# Patient Record
Sex: Female | Born: 1935
Health system: Southern US, Community
[De-identification: ages and names within clinical notes are randomized; demographics above are authoritative.]

## PROBLEM LIST (undated history)

## (undated) DIAGNOSIS — E669 Obesity, unspecified: Secondary | ICD-10-CM

## (undated) DIAGNOSIS — E785 Hyperlipidemia, unspecified: Secondary | ICD-10-CM

## (undated) DIAGNOSIS — E079 Disorder of thyroid, unspecified: Secondary | ICD-10-CM

## (undated) DIAGNOSIS — G459 Transient cerebral ischemic attack, unspecified: Secondary | ICD-10-CM

## (undated) DIAGNOSIS — R7303 Prediabetes: Secondary | ICD-10-CM

## (undated) DIAGNOSIS — I10 Essential (primary) hypertension: Secondary | ICD-10-CM

## (undated) HISTORY — DX: Hyperlipidemia, unspecified: E78.5

## (undated) HISTORY — DX: Prediabetes: R73.03

## (undated) HISTORY — DX: Obesity, unspecified: E66.9

## (undated) HISTORY — DX: Transient cerebral ischemic attack, unspecified: G45.9

## (undated) HISTORY — PX: CATARACT EXTRACTION: SUR2

## (undated) HISTORY — PX: FRACTURE SURGERY: SHX138

## (undated) HISTORY — DX: Essential (primary) hypertension: I10

## (undated) HISTORY — PX: EYE SURGERY: SHX253

## (undated) HISTORY — DX: Disorder of thyroid, unspecified: E07.9

---

## 1984-10-21 HISTORY — PX: ABDOMINAL HYSTERECTOMY: SHX81

## 2001-03-03 ENCOUNTER — Ambulatory Visit (HOSPITAL_COMMUNITY): Admission: RE | Admit: 2001-03-03 | Discharge: 2001-03-03 | Payer: Self-pay | Admitting: Ophthalmology

## 2002-05-30 ENCOUNTER — Encounter: Payer: Self-pay | Admitting: Emergency Medicine

## 2002-05-30 ENCOUNTER — Emergency Department (HOSPITAL_COMMUNITY): Admission: EM | Admit: 2002-05-30 | Discharge: 2002-05-30 | Payer: Self-pay | Admitting: Emergency Medicine

## 2002-09-14 ENCOUNTER — Ambulatory Visit (HOSPITAL_COMMUNITY): Admission: RE | Admit: 2002-09-14 | Discharge: 2002-09-14 | Payer: Self-pay | Admitting: Family Medicine

## 2002-09-14 ENCOUNTER — Encounter: Payer: Self-pay | Admitting: Family Medicine

## 2003-09-22 ENCOUNTER — Ambulatory Visit (HOSPITAL_COMMUNITY): Admission: RE | Admit: 2003-09-22 | Discharge: 2003-09-22 | Payer: Self-pay | Admitting: Family Medicine

## 2003-10-13 ENCOUNTER — Other Ambulatory Visit: Admission: RE | Admit: 2003-10-13 | Discharge: 2003-10-13 | Payer: Self-pay | Admitting: General Surgery

## 2004-09-17 ENCOUNTER — Ambulatory Visit: Payer: Self-pay | Admitting: Family Medicine

## 2004-09-24 ENCOUNTER — Ambulatory Visit (HOSPITAL_COMMUNITY): Admission: RE | Admit: 2004-09-24 | Discharge: 2004-09-24 | Payer: Self-pay | Admitting: Family Medicine

## 2004-09-26 ENCOUNTER — Ambulatory Visit (HOSPITAL_COMMUNITY): Admission: RE | Admit: 2004-09-26 | Discharge: 2004-09-26 | Payer: Self-pay | Admitting: Family Medicine

## 2005-03-15 ENCOUNTER — Ambulatory Visit: Payer: Self-pay | Admitting: Family Medicine

## 2005-07-24 ENCOUNTER — Ambulatory Visit: Payer: Self-pay | Admitting: Family Medicine

## 2005-08-08 ENCOUNTER — Ambulatory Visit (HOSPITAL_COMMUNITY): Admission: RE | Admit: 2005-08-08 | Discharge: 2005-08-08 | Payer: Self-pay | Admitting: General Surgery

## 2005-08-08 ENCOUNTER — Encounter (INDEPENDENT_AMBULATORY_CARE_PROVIDER_SITE_OTHER): Payer: Self-pay | Admitting: General Surgery

## 2005-11-28 ENCOUNTER — Ambulatory Visit: Payer: Self-pay | Admitting: Family Medicine

## 2005-12-23 ENCOUNTER — Ambulatory Visit (HOSPITAL_COMMUNITY): Admission: RE | Admit: 2005-12-23 | Discharge: 2005-12-23 | Payer: Self-pay | Admitting: Family Medicine

## 2006-01-13 ENCOUNTER — Ambulatory Visit: Payer: Self-pay | Admitting: Family Medicine

## 2006-01-15 ENCOUNTER — Ambulatory Visit (HOSPITAL_COMMUNITY): Admission: RE | Admit: 2006-01-15 | Discharge: 2006-01-15 | Payer: Self-pay | Admitting: Family Medicine

## 2006-01-28 ENCOUNTER — Ambulatory Visit: Payer: Self-pay | Admitting: Family Medicine

## 2006-02-25 ENCOUNTER — Ambulatory Visit: Payer: Self-pay | Admitting: Family Medicine

## 2006-04-15 ENCOUNTER — Ambulatory Visit: Payer: Self-pay | Admitting: Family Medicine

## 2006-06-17 ENCOUNTER — Ambulatory Visit: Payer: Self-pay | Admitting: Family Medicine

## 2006-06-26 ENCOUNTER — Ambulatory Visit (HOSPITAL_COMMUNITY): Admission: RE | Admit: 2006-06-26 | Discharge: 2006-06-26 | Payer: Self-pay | Admitting: Family Medicine

## 2006-07-22 ENCOUNTER — Encounter: Payer: Self-pay | Admitting: Family Medicine

## 2006-07-22 ENCOUNTER — Other Ambulatory Visit: Admission: RE | Admit: 2006-07-22 | Discharge: 2006-07-22 | Payer: Self-pay | Admitting: Family Medicine

## 2006-08-26 ENCOUNTER — Ambulatory Visit: Payer: Self-pay | Admitting: Family Medicine

## 2007-01-21 ENCOUNTER — Ambulatory Visit: Payer: Self-pay | Admitting: Family Medicine

## 2007-01-23 ENCOUNTER — Ambulatory Visit (HOSPITAL_COMMUNITY): Admission: RE | Admit: 2007-01-23 | Discharge: 2007-01-23 | Payer: Self-pay | Admitting: Family Medicine

## 2007-06-18 ENCOUNTER — Ambulatory Visit: Payer: Self-pay | Admitting: Family Medicine

## 2007-08-05 ENCOUNTER — Ambulatory Visit: Payer: Self-pay | Admitting: Family Medicine

## 2007-09-02 ENCOUNTER — Ambulatory Visit: Payer: Self-pay | Admitting: Family Medicine

## 2007-09-02 LAB — CONVERTED CEMR LAB
Basophils Relative: 1 % (ref 0–1)
Calcium: 9.4 mg/dL (ref 8.4–10.5)
HCT: 45.4 % (ref 36.0–46.0)
Lymphocytes Relative: 33 % (ref 12–46)
Monocytes Absolute: 0.5 10*3/uL (ref 0.1–1.0)
Monocytes Relative: 8 % (ref 3–12)
Neutrophils Relative %: 52 % (ref 43–77)
Potassium: 4.2 meq/L (ref 3.5–5.3)
RDW: 15.8 % — ABNORMAL HIGH (ref 11.5–15.5)
Sodium: 142 meq/L (ref 135–145)
TSH: 4.39 microintl units/mL (ref 0.350–5.50)
WBC: 6.6 10*3/uL (ref 4.0–10.5)

## 2007-09-28 ENCOUNTER — Ambulatory Visit (HOSPITAL_COMMUNITY): Admission: RE | Admit: 2007-09-28 | Discharge: 2007-09-28 | Payer: Self-pay | Admitting: Family Medicine

## 2008-01-19 ENCOUNTER — Ambulatory Visit: Payer: Self-pay | Admitting: Family Medicine

## 2008-01-27 ENCOUNTER — Encounter: Payer: Self-pay | Admitting: Family Medicine

## 2008-01-27 DIAGNOSIS — M479 Spondylosis, unspecified: Secondary | ICD-10-CM | POA: Insufficient documentation

## 2008-01-27 DIAGNOSIS — I10 Essential (primary) hypertension: Secondary | ICD-10-CM | POA: Insufficient documentation

## 2008-01-27 DIAGNOSIS — H409 Unspecified glaucoma: Secondary | ICD-10-CM | POA: Insufficient documentation

## 2008-01-29 ENCOUNTER — Ambulatory Visit (HOSPITAL_COMMUNITY): Admission: RE | Admit: 2008-01-29 | Discharge: 2008-01-29 | Payer: Self-pay | Admitting: Family Medicine

## 2008-02-01 ENCOUNTER — Ambulatory Visit: Payer: Self-pay | Admitting: Family Medicine

## 2008-03-21 ENCOUNTER — Emergency Department (HOSPITAL_COMMUNITY): Admission: EM | Admit: 2008-03-21 | Discharge: 2008-03-21 | Payer: Self-pay | Admitting: Emergency Medicine

## 2008-03-23 ENCOUNTER — Inpatient Hospital Stay (HOSPITAL_COMMUNITY): Admission: RE | Admit: 2008-03-23 | Discharge: 2008-03-25 | Payer: Self-pay | Admitting: Orthopedic Surgery

## 2008-05-09 ENCOUNTER — Encounter: Admission: RE | Admit: 2008-05-09 | Discharge: 2008-05-09 | Payer: Self-pay | Admitting: Orthopedic Surgery

## 2008-09-29 ENCOUNTER — Ambulatory Visit: Payer: Self-pay | Admitting: Family Medicine

## 2008-09-29 ENCOUNTER — Telehealth: Payer: Self-pay | Admitting: Family Medicine

## 2008-10-01 DIAGNOSIS — E669 Obesity, unspecified: Secondary | ICD-10-CM | POA: Insufficient documentation

## 2008-11-15 ENCOUNTER — Encounter: Payer: Self-pay | Admitting: Family Medicine

## 2008-12-06 ENCOUNTER — Encounter: Payer: Self-pay | Admitting: Family Medicine

## 2008-12-07 ENCOUNTER — Encounter: Payer: Self-pay | Admitting: Family Medicine

## 2008-12-08 LAB — CONVERTED CEMR LAB
Basophils Absolute: 0 10*3/uL (ref 0.0–0.1)
Calcium: 9.5 mg/dL (ref 8.4–10.5)
Chloride: 105 meq/L (ref 96–112)
Cholesterol: 182 mg/dL (ref 0–200)
Eosinophils Absolute: 0.6 10*3/uL (ref 0.0–0.7)
Eosinophils Relative: 10 % — ABNORMAL HIGH (ref 0–5)
Glucose, Bld: 83 mg/dL (ref 70–99)
HCT: 44.1 % (ref 36.0–46.0)
HDL: 51 mg/dL (ref >39)
Hemoglobin: 15.5 g/dL — ABNORMAL HIGH (ref 12.0–15.0)
Lymphocytes Relative: 38 % (ref 12–46)
MCV: 84.3 fL (ref 78.0–100.0)
Monocytes Absolute: 0.6 10*3/uL (ref 0.1–1.0)
Potassium: 4.1 meq/L (ref 3.5–5.3)
RBC: 5.23 M/uL — ABNORMAL HIGH (ref 3.87–5.11)
RDW: 15.4 % (ref 11.5–15.5)
TSH: 6.273 microintl units/mL — ABNORMAL HIGH (ref 0.350–4.50)
WBC: 5.9 10*3/uL (ref 4.0–10.5)

## 2008-12-12 ENCOUNTER — Ambulatory Visit: Payer: Self-pay | Admitting: Family Medicine

## 2008-12-12 ENCOUNTER — Encounter: Payer: Self-pay | Admitting: Family Medicine

## 2008-12-12 ENCOUNTER — Other Ambulatory Visit: Admission: RE | Admit: 2008-12-12 | Discharge: 2008-12-12 | Payer: Self-pay | Admitting: Family Medicine

## 2008-12-12 DIAGNOSIS — E039 Hypothyroidism, unspecified: Secondary | ICD-10-CM | POA: Insufficient documentation

## 2008-12-15 ENCOUNTER — Encounter: Payer: Self-pay | Admitting: Family Medicine

## 2008-12-15 LAB — CONVERTED CEMR LAB
Basophils Relative: 0 % (ref 0–1)
Lymphs Abs: 2.5 10*3/uL (ref 0.7–4.0)
MCHC: 31.9 g/dL (ref 30.0–36.0)
Monocytes Absolute: 0.5 10*3/uL (ref 0.1–1.0)
Neutro Abs: 2.5 10*3/uL (ref 1.7–7.7)
Platelets: 339 10*3/uL (ref 150–400)
RBC: 5.45 M/uL — ABNORMAL HIGH (ref 3.87–5.11)

## 2008-12-22 ENCOUNTER — Encounter: Payer: Self-pay | Admitting: Family Medicine

## 2009-01-04 ENCOUNTER — Encounter: Payer: Self-pay | Admitting: Family Medicine

## 2009-01-30 ENCOUNTER — Ambulatory Visit (HOSPITAL_COMMUNITY): Admission: RE | Admit: 2009-01-30 | Discharge: 2009-01-30 | Payer: Self-pay | Admitting: Family Medicine

## 2009-03-13 ENCOUNTER — Ambulatory Visit: Payer: Self-pay | Admitting: Family Medicine

## 2009-03-13 DIAGNOSIS — M25579 Pain in unspecified ankle and joints of unspecified foot: Secondary | ICD-10-CM | POA: Insufficient documentation

## 2009-04-03 ENCOUNTER — Encounter: Payer: Self-pay | Admitting: Family Medicine

## 2009-04-04 ENCOUNTER — Encounter: Payer: Self-pay | Admitting: Family Medicine

## 2009-07-14 IMAGING — CR DG ANKLE COMPLETE 3+V*R*
3 series · 3 of 3 positions shown · non-contrast
Comparison: None

CLINICAL DATA: Ankle pain post fall

RIGHT ANKLE - COMPLETE 3+ VIEW

[view not recorded (1 of 3)]
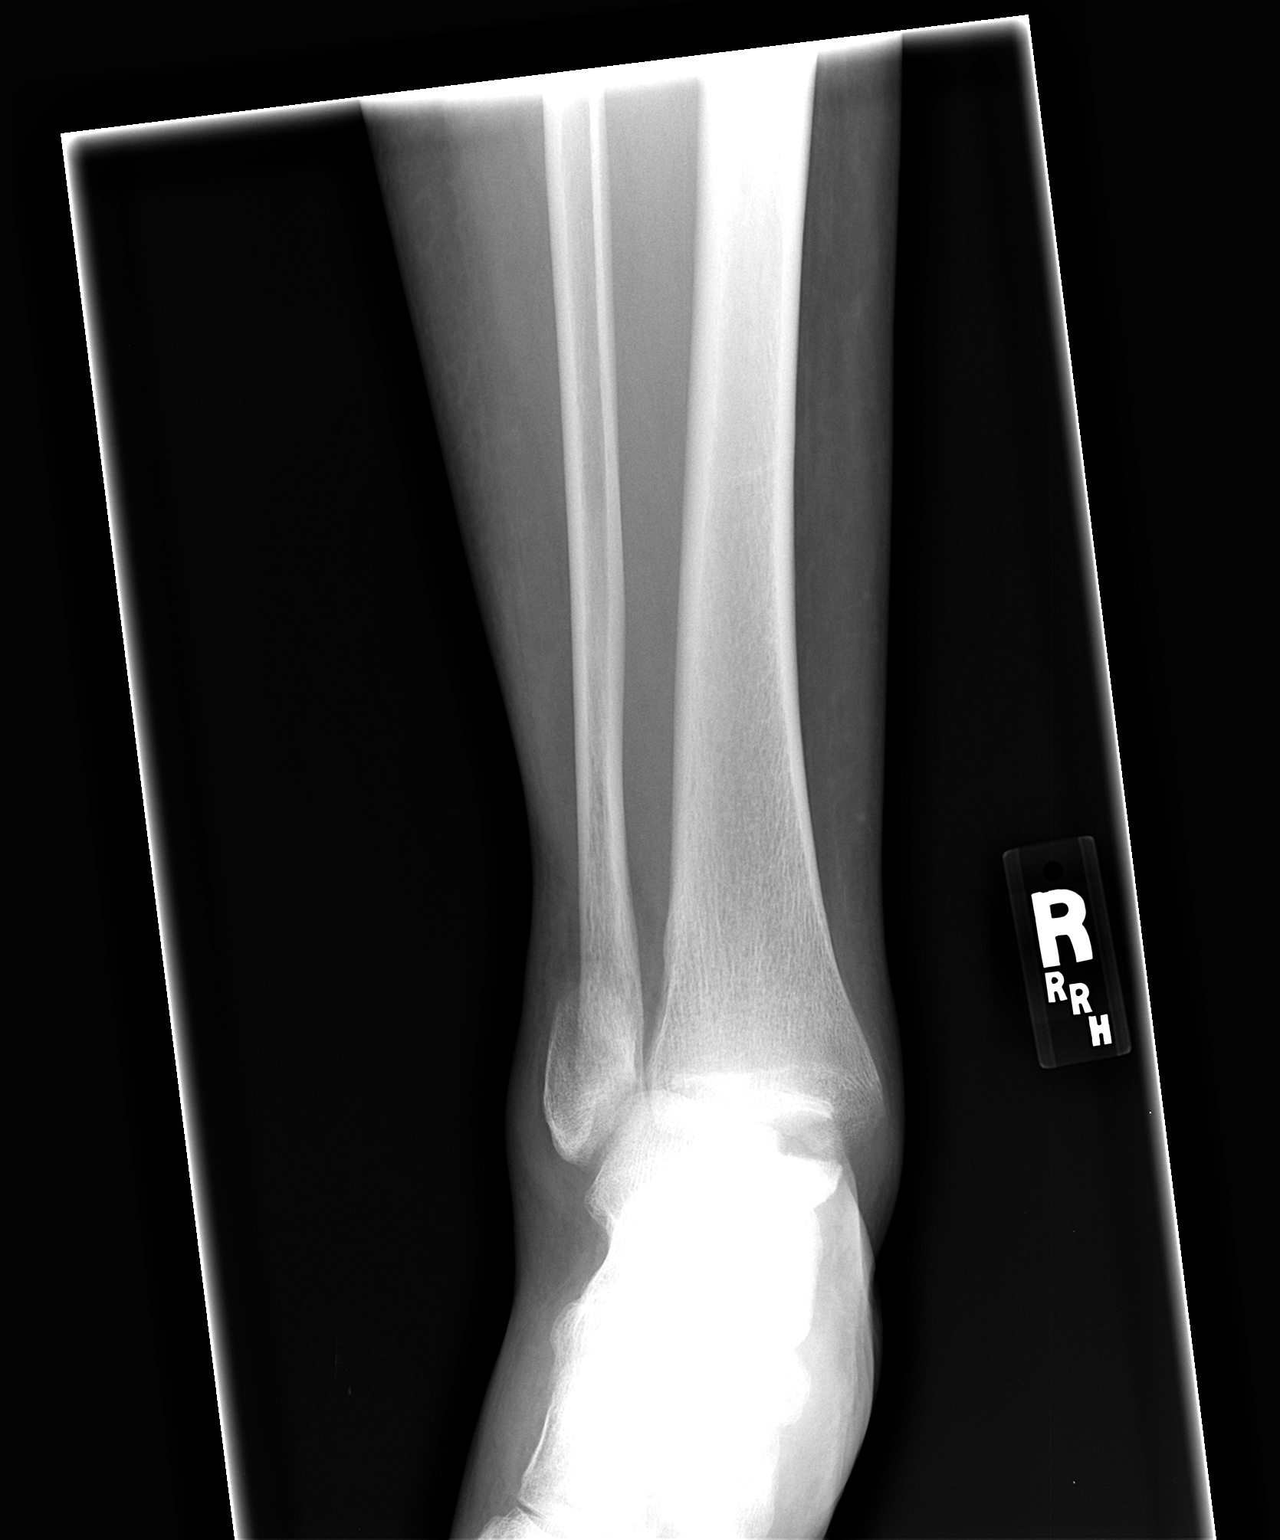

[view not recorded (2 of 3)]
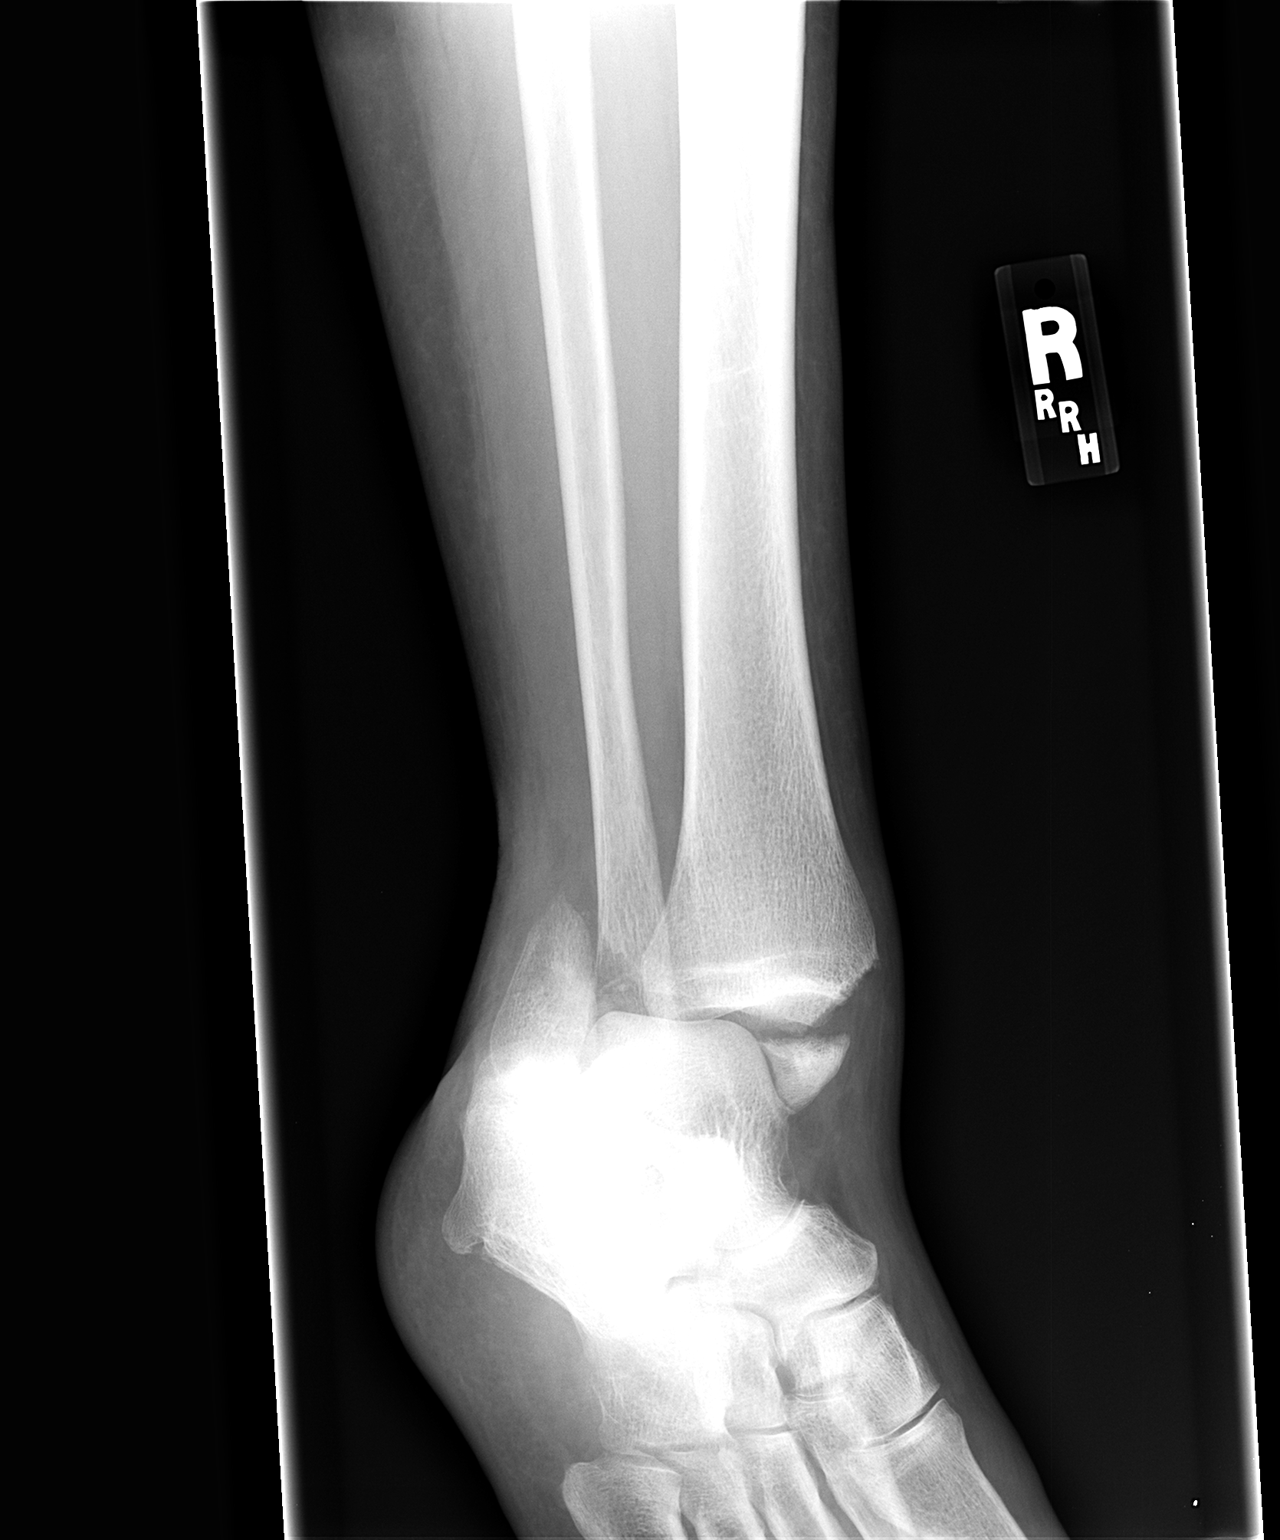

[view not recorded (3 of 3)]
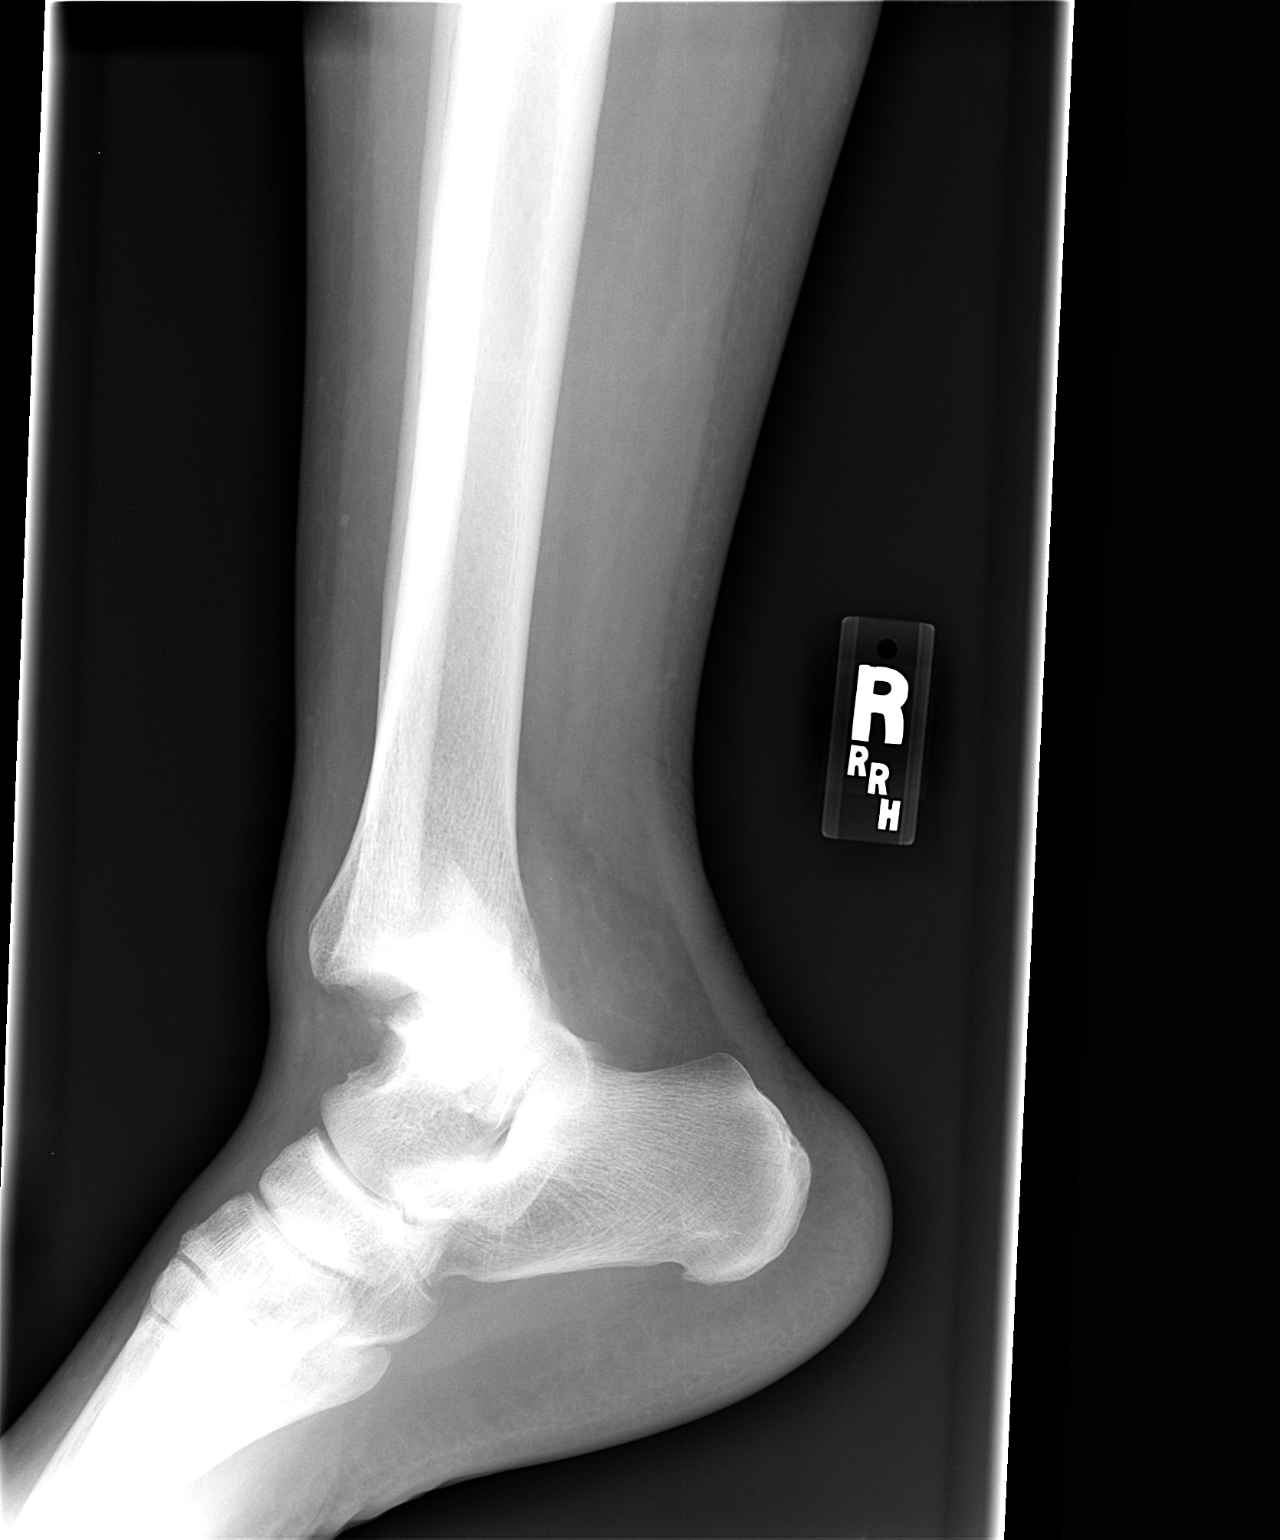

[3 of 3 positions shown; findings below may reference images not displayed]

FINDINGS: There is displaced fracture of distal right fibula and
distal right tibia medial and lateral malleolus.  There is
posterior ankle dislocation with probable fracture of the posterior
tibial malleolus.
IMPRESSION: Right ankle dislocation with tri malleolar fracture.

## 2009-07-14 IMAGING — CR DG ANKLE 2V *R*
2 series · 2 of 2 positions shown · non-contrast
Comparison: 03/21/2008

CLINICAL DATA: Postreduction.  The patient fell.  Ankle pain.

RIGHT ANKLE - 2 VIEW

[view not recorded (1 of 2)]
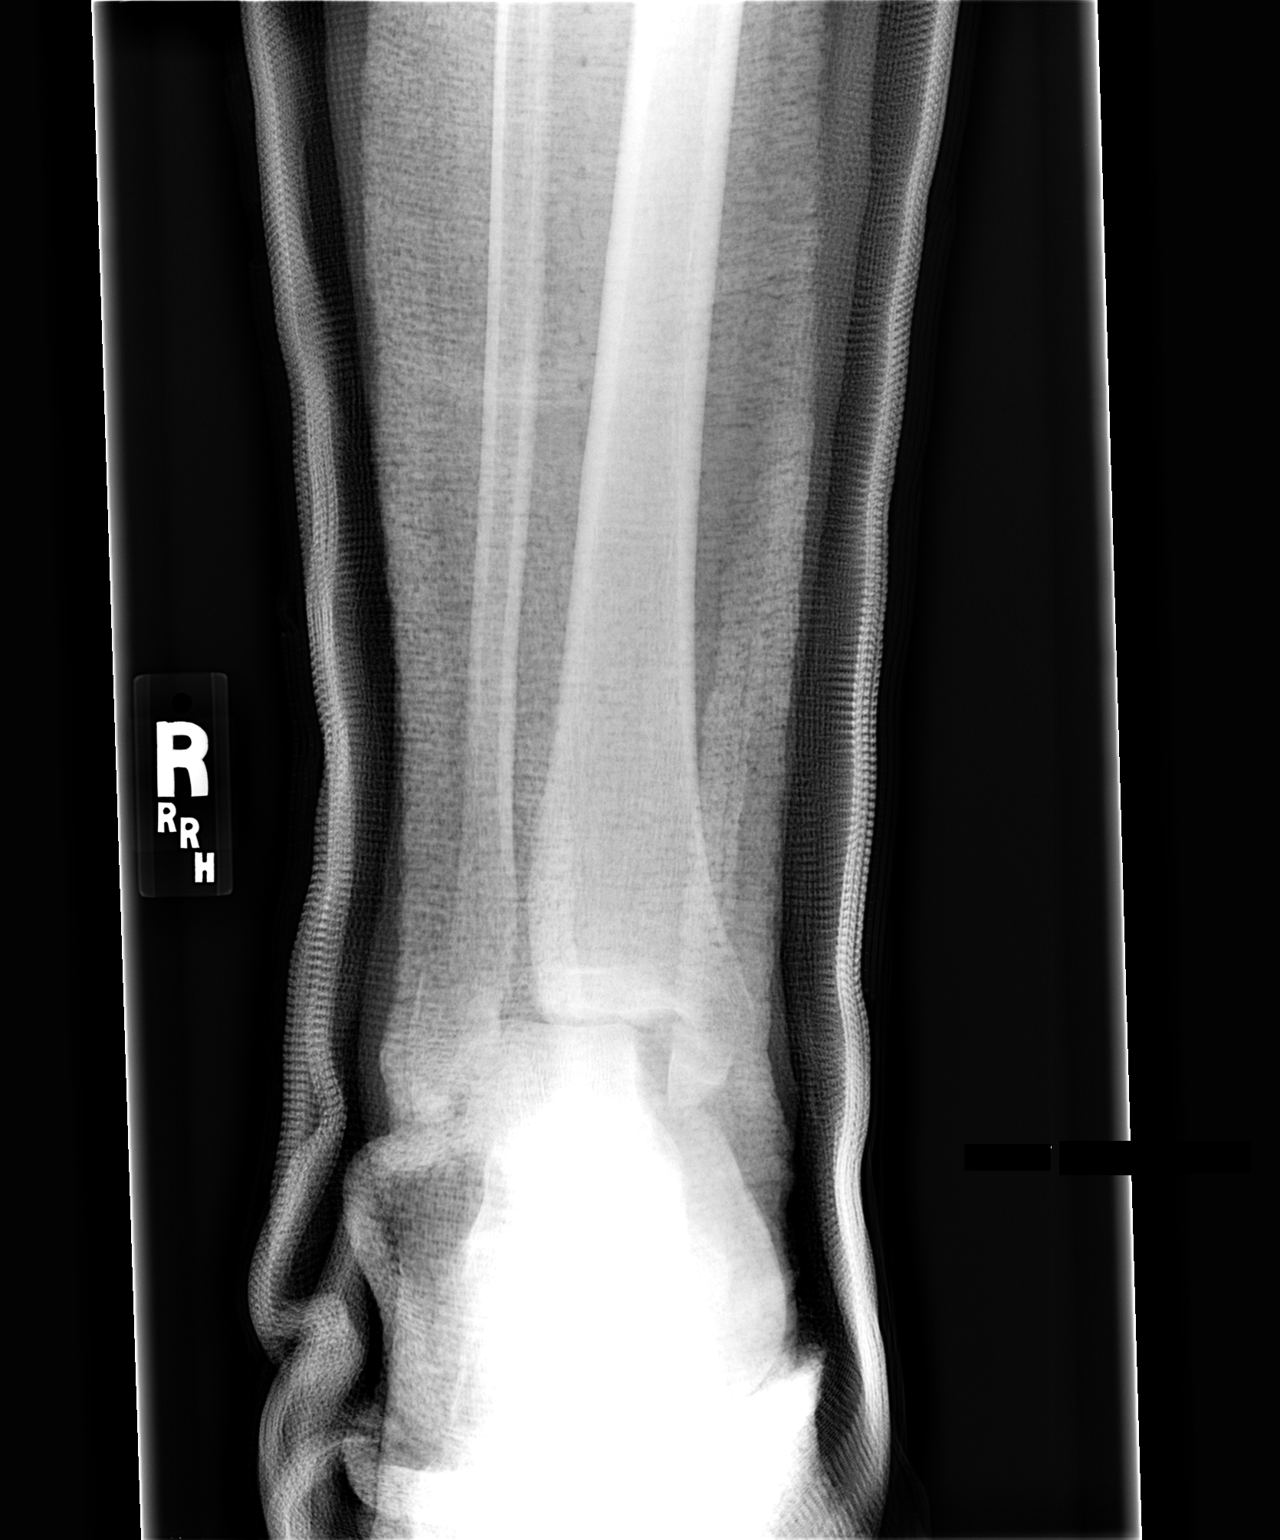

[view not recorded (2 of 2)]
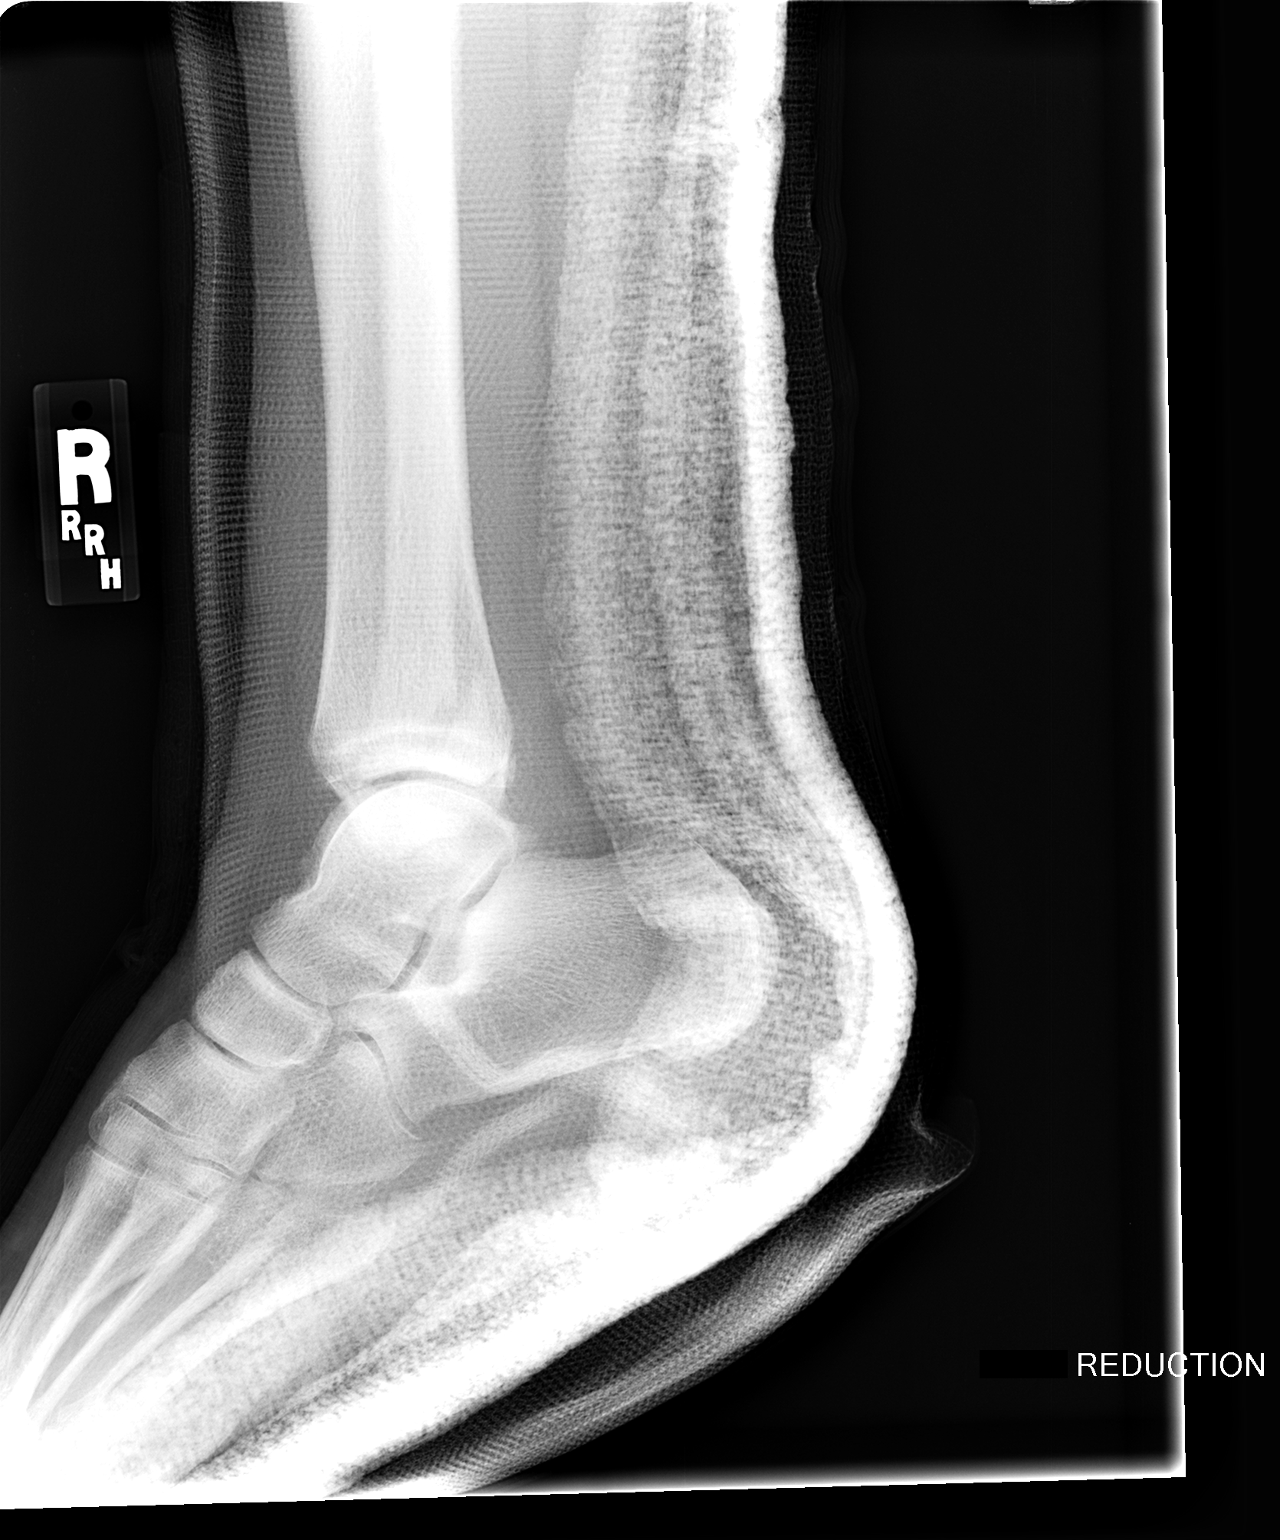

[2 of 2 positions shown; findings below may reference images not displayed]

FINDINGS: Postreduction films demonstrate a plaster splinting
overlying the right ankle.  Again noted is trimalleolar fracture.
The mortise is widened medially.  No new fractures are identified.
Detail is limited because of plaster splinting material..
IMPRESSION: Status post reduction of trimalleolar fracture.  Widened mortise
medially.

## 2009-08-17 ENCOUNTER — Emergency Department (HOSPITAL_COMMUNITY): Admission: EM | Admit: 2009-08-17 | Discharge: 2009-08-17 | Payer: Self-pay | Admitting: Emergency Medicine

## 2009-08-17 ENCOUNTER — Telehealth: Payer: Self-pay | Admitting: Family Medicine

## 2009-08-17 ENCOUNTER — Encounter: Payer: Self-pay | Admitting: Family Medicine

## 2009-08-31 ENCOUNTER — Ambulatory Visit: Payer: Self-pay | Admitting: Family Medicine

## 2009-08-31 DIAGNOSIS — E785 Hyperlipidemia, unspecified: Secondary | ICD-10-CM | POA: Insufficient documentation

## 2009-08-31 DIAGNOSIS — R5383 Other fatigue: Secondary | ICD-10-CM | POA: Insufficient documentation

## 2009-08-31 DIAGNOSIS — E782 Mixed hyperlipidemia: Secondary | ICD-10-CM | POA: Insufficient documentation

## 2009-08-31 DIAGNOSIS — G459 Transient cerebral ischemic attack, unspecified: Secondary | ICD-10-CM | POA: Insufficient documentation

## 2009-08-31 DIAGNOSIS — R079 Chest pain, unspecified: Secondary | ICD-10-CM | POA: Insufficient documentation

## 2009-08-31 DIAGNOSIS — R5381 Other malaise: Secondary | ICD-10-CM

## 2009-09-01 ENCOUNTER — Encounter: Payer: Self-pay | Admitting: Family Medicine

## 2009-09-01 LAB — CONVERTED CEMR LAB
HDL: 53 mg/dL (ref >39)
LDL Cholesterol: 135 mg/dL — ABNORMAL HIGH (ref 0–99)

## 2009-09-04 ENCOUNTER — Encounter: Payer: Self-pay | Admitting: Family Medicine

## 2009-09-04 LAB — CONVERTED CEMR LAB

## 2009-09-05 ENCOUNTER — Encounter: Payer: Self-pay | Admitting: Family Medicine

## 2009-09-05 LAB — CONVERTED CEMR LAB
AST: 24 units/L (ref 0–37)
Albumin: 4.1 g/dL (ref 3.5–5.2)
Indirect Bilirubin: 0.4 mg/dL (ref 0.0–0.9)
Total Bilirubin: 0.5 mg/dL (ref 0.3–1.2)

## 2009-09-07 ENCOUNTER — Encounter: Payer: Self-pay | Admitting: Family Medicine

## 2009-09-07 ENCOUNTER — Inpatient Hospital Stay (HOSPITAL_BASED_OUTPATIENT_CLINIC_OR_DEPARTMENT_OTHER): Admission: RE | Admit: 2009-09-07 | Discharge: 2009-09-07 | Payer: Self-pay | Admitting: Cardiology

## 2009-09-12 ENCOUNTER — Encounter: Payer: Self-pay | Admitting: Family Medicine

## 2009-09-18 LAB — CONVERTED CEMR LAB: HCV Ab: NEGATIVE

## 2009-09-21 ENCOUNTER — Encounter: Payer: Self-pay | Admitting: Family Medicine

## 2009-09-22 ENCOUNTER — Ambulatory Visit (HOSPITAL_COMMUNITY): Admission: RE | Admit: 2009-09-22 | Discharge: 2009-09-22 | Payer: Self-pay | Admitting: Neurology

## 2009-10-12 ENCOUNTER — Ambulatory Visit: Payer: Self-pay | Admitting: Family Medicine

## 2009-11-08 ENCOUNTER — Encounter: Payer: Self-pay | Admitting: Family Medicine

## 2009-12-14 ENCOUNTER — Encounter: Payer: Self-pay | Admitting: Family Medicine

## 2009-12-21 ENCOUNTER — Telehealth (INDEPENDENT_AMBULATORY_CARE_PROVIDER_SITE_OTHER): Payer: Self-pay | Admitting: *Deleted

## 2010-01-09 ENCOUNTER — Ambulatory Visit: Payer: Self-pay | Admitting: Family Medicine

## 2010-05-31 ENCOUNTER — Ambulatory Visit: Payer: Self-pay | Admitting: Family Medicine

## 2010-06-07 ENCOUNTER — Ambulatory Visit (HOSPITAL_COMMUNITY): Admission: RE | Admit: 2010-06-07 | Discharge: 2010-06-07 | Payer: Self-pay | Admitting: Family Medicine

## 2010-06-08 ENCOUNTER — Ambulatory Visit: Payer: Self-pay | Admitting: Family Medicine

## 2010-06-26 ENCOUNTER — Ambulatory Visit: Payer: Self-pay | Admitting: Family Medicine

## 2010-07-13 ENCOUNTER — Ambulatory Visit: Payer: Self-pay | Admitting: Family Medicine

## 2010-07-17 ENCOUNTER — Telehealth: Payer: Self-pay | Admitting: Family Medicine

## 2010-07-17 LAB — CONVERTED CEMR LAB
Albumin: 3.8 g/dL (ref 3.5–5.2)
Alkaline Phosphatase: 78 units/L (ref 39–117)
BUN: 11 mg/dL (ref 6–23)
Bilirubin, Direct: 0.1 mg/dL (ref 0.0–0.3)
Calcium: 9.6 mg/dL (ref 8.4–10.5)
Eosinophils Absolute: 0.5 10*3/uL (ref 0.0–0.7)
Lymphocytes Relative: 36 % (ref 12–46)
Lymphs Abs: 2.2 10*3/uL (ref 0.7–4.0)
Neutro Abs: 3 10*3/uL (ref 1.7–7.7)
Neutrophils Relative %: 48 % (ref 43–77)
Platelets: 287 10*3/uL (ref 150–400)
RDW: 15.4 % (ref 11.5–15.5)
TSH: 4.48 microintl units/mL (ref 0.350–4.500)
Total Bilirubin: 0.6 mg/dL (ref 0.3–1.2)
Total Protein: 6.5 g/dL (ref 6.0–8.3)
Triglycerides: 63 mg/dL (ref <150)
VLDL: 13 mg/dL (ref 0–40)
WBC: 6.2 10*3/uL (ref 4.0–10.5)

## 2010-08-24 ENCOUNTER — Ambulatory Visit: Payer: Self-pay | Admitting: Family Medicine

## 2010-08-24 DIAGNOSIS — L723 Sebaceous cyst: Secondary | ICD-10-CM | POA: Insufficient documentation

## 2010-08-27 ENCOUNTER — Telehealth: Payer: Self-pay | Admitting: Family Medicine

## 2010-11-02 ENCOUNTER — Ambulatory Visit
Admission: RE | Admit: 2010-11-02 | Discharge: 2010-11-02 | Payer: Self-pay | Source: Home / Self Care | Attending: Family Medicine | Admitting: Family Medicine

## 2010-11-20 NOTE — Assessment & Plan Note (Signed)
Summary: flu shot  Nurse Visit   Allergies: No Known Drug Allergies  Immunizations Administered:  Influenza Vaccine # 1:    Vaccine Type: Fluvax MCR    Site: left deltoid    Mfr: novartis    Dose: 0.5 ml    Route: IM    Given by: Adella Hare LPN    Exp. Date: 05/12    Lot #: [id number]    VIS given: 05/15/10 version given June 26, 2010.  Orders Added: 1)  Influenza Vaccine MCR [00025]

## 2010-11-20 NOTE — Cardiovascular Report (Signed)
Summary: Cardiac Cath Other  Cardiac Cath Other   Imported By: Lind Guest 06/22/2010 08:01:29  _____________________________________________________________________  External Attachment:    Type:   Image     Comment:   External Document

## 2010-11-20 NOTE — Progress Notes (Signed)
Summary: referral  Phone Note Call from Patient   Summary of Call: called pt to follow up on her MRI and she stated she had one done. That Dr. Gerilyn Pilgrim ordered it for her. And had it done at [facility]. Just wanted to make sure this was taking care of. And it was. Initial call taken by: Rudene Anda,  December 21, 2009 9:20 AM

## 2010-11-20 NOTE — Assessment & Plan Note (Signed)
Summary: boil on shoulder   Vital Signs:  Patient profile:   77 year old female Menstrual status:  hysterectomy Height:      68 inches Weight:      224 pounds BMI:     34.18 O2 Sat:      98 % on Room air Pulse rate:   67 / minute Resp:     16 per minute BP sitting:   164 / 80  (left arm)  Vitals Entered By: Adella Hare LPN (August 24, 2010 11:18 AM)  O2 Flow:  Room air CC: follow-up visit Is Patient Diabetic? Yes Did you bring your meter with you today? No Pain Assessment Patient in pain? no        CC:  follow-up visit.  History of Present Illness: 1 week h/o paingful swelling right shoulder, initially started as a a painful hair bump, which she squeezed and which rapidly emlarged, became painful and also drained pus. Has not hd this in the past. She deneies documented fever , but has had some chills.She is distresed since her BP is still high, she is asymptomatic  however, no headache or lightheadedness.  Current Medications (verified): 1)  Aspirin 325 Mg Tabs (Aspirin) .... One Tab By Mouth Once Daily 2)  Metoprolol Succinate 25 Mg Xr24h-Tab (Metoprolol Succinate) .... Take 1 Tablet By Mouth Once A Day 3)  Amlodipine Besylate 10 Mg Tabs (Amlodipine Besylate) .... Take 1 Tablet By Mouth Once A Day  Allergies (verified): 1)  ! Ace Inhibitors  Review of Systems General:  Complains of chills, fatigue, and malaise. Eyes:  Denies discharge and red eye. ENT:  Denies hoarseness and nasal congestion. CV:  Denies chest pain or discomfort, palpitations, and swelling of feet. Resp:  Denies cough and sputum productive. GI:  Denies abdominal pain, constipation, diarrhea, nausea, and vomiting. GU:  Denies dysuria and urinary frequency. Derm:  Complains of lesion(s). Neuro:  Denies headaches. Psych:  Denies anxiety and depression. Endo:  Denies cold intolerance, excessive thirst, excessive urination, and heat intolerance. Heme:  Denies abnormal bruising and  bleeding. Allergy:  Denies hives or rash and itching eyes.  Physical Exam  General:  Well-developed,well-nourished,in no acute distress; alert,appropriate and cooperative throughout examination HEENT: No facial asymmetry,  EOMI, No sinus tenderness, TM's Clear, oropharynx  pink and moist.   Chest: Clear to auscultation bilaterally.  CVS: S1, S2, No murmurs, No S3.   Abd: Soft, Nontender.  MS: Adequate ROM spine, hips, shoulders and knees.  Ext: No edema.   CNS: CN 2-12 intact, power tone and sensation normal throughout.   Skin: Infected sebaceous cyst on posterior right shoulder , bleeding sebaceous material extruded on pressure Psych: Good eye contact, normal affect.  Memory intact, not anxious or depressed appearing.    Impression & Recommendations:  Problem # 1:  SEBACEOUS CYST (ICD-706.2) Assessment Comment Only  Orders: Medicare Electronic Prescription [phone]) Surgical Referral (Surgery) Rocephin  250mg  (W2956) Admin of Therapeutic Inj  intramuscular or subcutaneous (21308) Surgical Referral (Surgery)  Problem # 2:  HYPERLIPIDEMIA (ICD-272.4) Assessment: Improved  The following medications were removed from the medication list:    Lovastatin 20 Mg Tabs (Lovastatin) .Marland Kitchen... Take 1 tab by mouth at bedtime Low fat dietdiscussed and encouraged  Labs Reviewed: Creat: 0.81 (07/13/2010)     Labs Reviewed: SGOT: 20 (07/13/2010)   SGPT: 17 (07/13/2010)   HDL:51 (07/13/2010), 53 (08/31/2009)  LDL:123 (07/13/2010), 135 (08/31/2009)  Chol:187 (07/13/2010), 201 (08/31/2009)  Trig:63 (07/13/2010), 63 (08/31/2009)  Problem #  3:  HYPERTENSION (ICD-401.9) Assessment: Deteriorated  Her updated medication list for this problem includes:    Metoprolol Succinate 25 Mg Xr24h-tab (Metoprolol succinate) .Marland Kitchen... Take 1 tablet by mouth once a day    Amlodipine Besylate 10 Mg Tabs (Amlodipine besylate) .Marland Kitchen... Take 1 tablet by mouth once a day Patient advised to follow low sodium diet rich in  fruit and vegetables, and to commit to at least 30 minutes 5 days per week of regular exercise , to improve blood presure control.   BP today: 164/80 Prior BP: 150/80 (07/13/2010)  Labs Reviewed: K+: 4.5 (07/13/2010) Creat: : 0.81 (07/13/2010)   Chol: 187 (07/13/2010)   HDL: 51 (07/13/2010)   LDL: 123 (07/13/2010)   TG: 63 (07/13/2010)  Complete Medication List: 1)  Aspirin 325 Mg Tabs (Aspirin) .... One tab by mouth once daily 2)  Metoprolol Succinate 25 Mg Xr24h-tab (Metoprolol succinate) .... Take 1 tablet by mouth once a day 3)  Amlodipine Besylate 10 Mg Tabs (Amlodipine besylate) .... Take 1 tablet by mouth once a day 4)  Doxepin Hcl 100 Mg Caps (Doxepin hcl) .... Take 1 capsule by mouth two times a day  Patient Instructions: 1)  Please schedule a follow-up appointment in 3 months. 2)  You are referred to Dr Lovell Sheehan for further management, you have an infected cyst . 3)  you will get Rocephin in the office, and meds are sent to your pharmacy Prescriptions: DOXEPIN HCL 100 MG CAPS (DOXEPIN HCL) Take 1 capsule by mouth two times a day  #20 x 0   Entered and Authorized by:   Syliva Overman MD   Signed by:   Syliva Overman MD on 08/24/2010   Method used:   Electronically to        [facility] [phone]* (retail)       [address]       [place], [place]  [address]       Ph: [phone] or [phone]       Fax: [phone]   RxID:   [id number]    Medication Administration  Injection # 1:    Medication: Rocephin  250mg     Diagnosis: SEBACEOUS CYST (ICD-706.2)    Route: IM    Site: R deltoid    Exp Date: 05/14    Lot #: [id number]    Mfr: [email or web address]    Comments: rocephin 500mg     Patient tolerated injection without complications    Given by: Adella Hare LPN (August 24, 2010 2:00 PM)  Orders Added: 1)  Est. Patient Level IV [99214] 2)  Medicare Electronic Prescription [G8553] 3)  Surgical Referral [Surgery] 4)  Rocephin  250mg  [J0696] 5)  Admin  of Therapeutic Inj  intramuscular or subcutaneous [96372] 6)  Surgical Referral [Surgery]     Medication Administration  Injection # 1:    Medication: Rocephin  250mg     Diagnosis: SEBACEOUS CYST (ICD-706.2)    Route: IM    Site: R deltoid    Exp Date: 05/14    Lot #: [id number]    Mfr: [email or web address]    Comments: rocephin 500mg     Patient tolerated injection without complications    Given by: Adella Hare LPN (August 24, 2010 2:00 PM)  Orders Added: 1)  Est. Patient Level IV [95638] 2)  Medicare Electronic Prescription [G8553] 3)  Surgical Referral [Surgery] 4)  Rocephin  250mg  [J0696] 5)  Admin of Therapeutic Inj  intramuscular or subcutaneous [  96372] 6)  Surgical Referral [Surgery]   Appended Document: boil on shoulder pls give Jaala Keil for 2nd week in January re blood pressure, thanks I had given a 3 month that's too long  Appended Document: boil on shoulder appt. 1.13.11 @ 8:00

## 2010-11-20 NOTE — Assessment & Plan Note (Signed)
Summary: F UP   Vital Signs:  Patient profile:   77 year old female Menstrual status:  hysterectomy Height:      68 inches Weight:      224 pounds BMI:     34.18 O2 Sat:      96 % Pulse rate:   71 / minute Pulse rhythm:   regular Resp:     16 per minute BP sitting:   148 / 80  (left arm) Cuff size:   large  Vitals Entered By: Everitt Amber LPN (May 31, 2010 8:05 AM)  Nutrition Counseling: Patient's BMI is greater than 25 and therefore counseled on weight management options. CC: Follow up chronic problems   CC:  Follow up chronic problems.  History of Present Illness: Reports  that she has been  doing well. She has had several visits withcardiology neg cath and new beta blocker added, blood pressure noted to be above140 since med changes were made Denies recent fever or chills. Denies sinus pressure, nasal congestion , ear pain or sore throat. Denies chest congestion, or cough productive of sputum. Denies chest pain, palpitations, PND, orthopnea or leg swelling. Denies abdominal pain, nausea, vomitting, diarrhea or constipation. Denies change in bowel movements or bloody stool. Denies dysuria , frequency, incontinence or hesitancy. Denies  joint pain, swelling, or reduced mobility. she walks for 1 hour daily, and is frustrated that her weight is unchanged. Denies headaches, vertigo, seizures. Denies depression, anxiety or insomnia. Denies  rash, lesions, or itch.     Current Medications (verified): 1)  Amlodipine Besylate 5 Mg  Tabs (Amlodipine Besylate) .... One Tab By Mouth Once Daily 2)  Oscal 500/200 D-3 500-200 Mg-Unit  Tabs (Calcium-Vitamin D) .... One Tab Po Three Times A Day 3)  Lovastatin 20 Mg Tabs (Lovastatin) .... Take 1 Tab By Mouth At Bedtime 4)  Aspirin 325 Mg Tabs (Aspirin) 5)  Metoprolol Succinate 25 Mg Xr24h-Tab (Metoprolol Succinate) .... Take 1 Tablet By Mouth Once A Day  Allergies (verified): No Known Drug Allergies  Review of Systems  See HPI General:  Complains of sleep disorder; sometimes difficulty fallinmg asleep when she has alot on her miind. Eyes:  Denies blurring and discharge. Endo:  Denies cold intolerance, excessive hunger, excessive thirst, excessive urination, heat intolerance, polyuria, and weight change. Heme:  Denies abnormal bruising and bleeding. Allergy:  Denies hives or rash and itching eyes.  Physical Exam  General:  Well-developed,obese,in no acute distress; alert,appropriate and cooperative throughout examination HEENT: No facial asymmetry,  EOMI, No sinus tenderness, TM's Clear, oropharynx  pink and moist.   Chest: Clear to auscultation bilaterally.  CVS: S1, S2, No murmurs, No S3.   Abd: Soft, Nontender.  MS: Adequate ROM spine, hips, shoulders and knees.  Ext: No edema.   CNS: CN 2-12 intact, power tone and sensation normal throughout.   Skin: Intact, no visible lesions or rashes.  Psych: Good eye contact, normal affect.  Memory intact, not anxious or depressed appearing.    Impression & Recommendations:  Problem # 1:  HYPERLIPIDEMIA (ICD-272.4) Assessment Comment Only  Her updated medication list for this problem includes:    Lovastatin 20 Mg Tabs (Lovastatin) .Marland Kitchen... Take 1 tab by mouth at bedtime, need to obtain most recent lab data and oV from cardiology, low fat diet encouraged  Labs Reviewed: SGOT: 24 (09/01/2009)   SGPT: 38 (09/01/2009)   HDL:53 (08/31/2009), 51 (12/06/2008)  LDL:135 (08/31/2009), 118 (12/06/2008)  Chol:201 (08/31/2009), 182 (12/06/2008)  Trig:63 (08/31/2009), 63 (12/06/2008)  Problem # 2:  OBESITY, UNSPECIFIED (ICD-278.00) Assessment: Unchanged  Ht: 68 (05/31/2010)   Wt: 224 (05/31/2010)   BMI: 34.18 (05/31/2010)  Problem # 3:  HYPERTENSION (ICD-401.9) Assessment: Deteriorated  The following medications were removed from the medication list:    Amlodipine Besylate 5 Mg Tabs (Amlodipine besylate) ..... One tab by mouth once daily Her updated medication  list for this problem includes:    Metoprolol Succinate 25 Mg Xr24h-tab (Metoprolol succinate) .Marland Kitchen... Take 1 tablet by mouth once a day    Amlodipine Besy-benazepril Hcl 5-10 Mg Caps (Amlodipine besy-benazepril hcl) .Marland Kitchen... Take 1 capsule by mouth once a day  BP today: 148/80 Prior BP: 130/80 (01/09/2010)  Labs Reviewed: K+: 4.1 (12/06/2008) Creat: : 0.80 (12/06/2008)   Chol: 201 (08/31/2009)   HDL: 53 (08/31/2009)   LDL: 135 (08/31/2009)   TG: 63 (08/31/2009)  Problem # 4:  GLAUCOMA (ICD-365.9) Assessment: Comment Only pt encouraged to keep appts with opthalmology regularly  Complete Medication List: 1)  Oscal 500/200 D-3 500-200 Mg-unit Tabs (Calcium-vitamin d) .... One tab po three times a day 2)  Lovastatin 20 Mg Tabs (Lovastatin) .... Take 1 tab by mouth at bedtime 3)  Aspirin 325 Mg Tabs (Aspirin) 4)  Metoprolol Succinate 25 Mg Xr24h-tab (Metoprolol succinate) .... Take 1 tablet by mouth once a day 5)  Amlodipine Besy-benazepril Hcl 5-10 Mg Caps (Amlodipine besy-benazepril hcl) .... Take 1 capsule by mouth once a day  Other Orders: Radiology Referral (Radiology)  Patient Instructions: 1)  F/U in 6 to 8 weeks 2)  Ear flush in next week, with audiometry 3)  It is important that you exercise regularly at least 20 minutes 5 times a week. If you develop chest pain, have severe difficulty breathing, or feel very tired , stop exercising immediately and seek medical attention. 4)  You need to lose weight. Consider a lower calorie diet and regular exercise. Goal is 3 pounds 5)  New med for HTN , stop the amlodipine pls your BP is high. 6)    [place] to be scheduled Prescriptions: AMLODIPINE BESY-BENAZEPRIL HCL 5-10 MG CAPS (AMLODIPINE BESY-BENAZEPRIL HCL) Take 1 capsule by mouth once a day  #30 x 2   Entered by:   Everitt Amber LPN   Authorized by:   Syliva Overman MD   Signed by:   Everitt Amber LPN on 16/07/9603   Method used:   Print[facility] then [facility] ...       [facility]  [address] [phone]*  (retail)       [address]       [place], [place]  [address]       Ph: [phone] or [phone]       Fax: [phone]   RxID:   [id number] AMLODIPINE BESY-BENAZEPRIL HCL 5-10 MG CAPS (AMLODIPINE BESY-BENAZEPRIL HCL) Take 1 capsule by mouth once a day  #30 x 2   Entered and Authorized by:   Syliva Overman MD   Signed by:   Syliva Overman MD on 05/31/2010   Method used:   Electronically to        [facility]. [phone]* (retail)       [address]       [place], [place]  [address]       Ph: [phone] or [phone]       Fax: [phone]   RxID:   [id number]

## 2010-11-20 NOTE — Assessment & Plan Note (Signed)
Summary: ear  Nurse Visit  Comments PATIENT CAME TODAY FOR AN EAR FLUSH, EARS WERE CLEAR SO THIS WAS NOT PERFORMED A HEARING TEST WAS ADMINISTERED PER REQUEST OF DR SIMPSON 40db HL: Left  500 hz: 40db 1000 hz: No Response 2000 hz: 40db 4000 hz: 40db Right  500 hz: 40db 1000 hz: No Response 2000 hz: 40db 4000 hz: 40db    Allergies: No Known Drug Allergies pt had come in for ear irrigation, after use of softener , no wax visible,she does have hearing loss on testing /screening, but wishe to wait on referral for further management  bP check [per pt request was 140/80

## 2010-11-20 NOTE — Assessment & Plan Note (Signed)
Summary: f up   Vital Signs:  Patient profile:   77 year old female Menstrual status:  hysterectomy Height:      68 inches Weight:      222 pounds BMI:     33.88 O2 Sat:      95 % Pulse rate:   67 / minute Pulse rhythm:   regular Resp:     16 per minute BP sitting:   150 / 80  (left arm) Cuff size:   large  Vitals Entered By: Everitt Amber LPN (July 13, 2010 8:54 AM)  Nutrition Counseling: Patient's BMI is greater than 25 and therefore counseled on weight management options.  CC: Follow up chronic problems   CC:  Follow up chronic problems.  History of Present Illness: Reports  that  she has been doing well, she is just extremely bothered that she is unable to have a nl BP Denies recent fever or chills. Denies sinus pressure, nasal congestion , ear pain or sore throat. Denies chest congestion, or cough productive of sputum. Denies chest pain, palpitations, PND, orthopnea or leg swelling. Denies abdominal pain, nausea, vomitting, diarrhea or constipation. Denies change in bowel movements or bloody stool. Denies dysuria , frequency, incontinence or hesitancy. Denies  joint pain, swelling, or reduced mobility. Denies headaches, vertigo, seizures. Denies depression, anxiety or insomnia. Denies  rash, lesions, or itch.     Current Medications (verified): 1)  Oscal 500/200 D-3 500-200 Mg-Unit  Tabs (Calcium-Vitamin D) .... One Tab Po Three Times A Day 2)  Lovastatin 20 Mg Tabs (Lovastatin) .... Take 1 Tab By Mouth At Bedtime 3)  Aspirin 325 Mg Tabs (Aspirin) 4)  Metoprolol Succinate 25 Mg Xr24h-Tab (Metoprolol Succinate) .... Take 1 Tablet By Mouth Once A Day 5)  Amlodipine Besy-Benazepril Hcl 5-10 Mg Caps (Amlodipine Besy-Benazepril Hcl) .... Take 1 Capsule By Mouth Once A Day  Allergies (verified): 1)  ! Ace Inhibitors  Review of Systems      See HPI General:  Complains of fatigue. Eyes:  Denies discharge, eye pain, and red eye. ENT:  Denies hoarseness,  nosebleeds, sinus pressure, and sore throat. CV:  Denies chest pain or discomfort, palpitations, and swelling of feet. Resp:  Denies cough and sputum productive. GI:  Denies abdominal pain, bloody stools, constipation, diarrhea, nausea, and vomiting. GU:  Denies dysuria and urinary frequency. Derm:  Denies itching and rash. Psych:  Denies suicidal thoughts/plans, thoughts of violence, and unusual visions or sounds. Heme:  Denies abnormal bruising and bleeding. Allergy:  Complains of seasonal allergies.  Physical Exam  General:  Well-developed,obese,in no acute distress; alert,appropriate and cooperative throughout examination HEENT: No facial asymmetry,  EOMI, No sinus tenderness, TM's Clear, oropharynx  pink and moist.   Chest: Clear to auscultation bilaterally.  CVS: S1, S2, No murmurs, No S3.   Abd: Soft, Nontender.  MS: Adequate ROM spine, hips, shoulders and knees.  Ext: No edema.   CNS: CN 2-12 intact, power tone and sensation normal throughout.   Skin: Intact, no visible lesions or rashes.  Psych: Good eye contact, flat  affect.  Memory intact, not anxious or  depressed appearing.    Impression & Recommendations:  Problem # 1:  FATIGUE (ICD-780.79) Assessment Deteriorated  Orders: T-CBC w/Diff ([id number]) T-TSH ([id number])  Problem # 2:  HYPERTENSION (ICD-401.9) Assessment: Unchanged  The following medications were removed from the medication list:    Amlodipine Besy-benazepril Hcl 5-10 Mg Caps (Amlodipine besy-benazepril hcl) .Marland Kitchen... Take 1 capsule by mouth once a day Her  updated medication list for this problem includes:    Metoprolol Succinate 25 Mg Xr24h-tab (Metoprolol succinate) .Marland Kitchen... Take 1 tablet by mouth once a day    Amlodipine Besylate 10 Mg Tabs (Amlodipine besylate) .Marland Kitchen... Take 1 tablet by mouth once a day  Orders: T-Basic Metabolic Panel [phone])  BP today: 150/80 Prior BP: 148/80 (05/31/2010)  Labs Reviewed: K+: 4.1 (12/06/2008) Creat: :  0.80 (12/06/2008)   Chol: 201 (08/31/2009)   HDL: 53 (08/31/2009)   LDL: 135 (08/31/2009)   TG: 63 (08/31/2009)  Problem # 3:  OBESITY, UNSPECIFIED (ICD-278.00) Assessment: Deteriorated  Ht: 68 (07/13/2010)   Wt: 222 (07/13/2010)   BMI: 33.88 (07/13/2010) therapeutic lifestyle change discussed and encouraged  Problem # 4:  DIABETES MELLITUS, TYPE II (ICD-250.00) Assessment: Deteriorated  Complete Medication List: 1)  Oscal 500/200 D-3 500-200 Mg-unit Tabs (Calcium-vitamin d) .... One tab po three times a day 2)  Lovastatin 20 Mg Tabs (Lovastatin) .... Take 1 tab by mouth at bedtime 3)  Aspirin 325 Mg Tabs (Aspirin) 4)  Metoprolol Succinate 25 Mg Xr24h-tab (Metoprolol succinate) .... Take 1 tablet by mouth once a day 5)  Amlodipine Besylate 10 Mg Tabs (Amlodipine besylate) .... Take 1 tablet by mouth once a day  Other Orders: T-Hepatic Function [phone]) T-Lipid Profile [phone])  Patient Instructions: 1)  F/U in  2nd week in November. 2)  It is important that you exercise regularly at least 20 minutes 5 times a week. If you develop chest pain, have severe difficulty breathing, or feel very tired , stop exercising immediately and seek medical attention. 3)  You need to lose weight. Consider a lower calorie diet and regular exercise.  4)  BMP prior to visit, ICD-9: 5)  Hepatic Panel prior to visit, ICD-9: 6)  Lipid Panel prior to visit, ICD-9: 7)  TSH prior to visit, ICD-9:  fasting today 8)  CBC w/ Diff prior to visit, ICD-9: 9)  PLs stop amlodipine/benaz, new for your BP is amlodpine  10mg   Prescriptions: AMLODIPINE BESYLATE 10 MG TABS (AMLODIPINE BESYLATE) Take 1 tablet by mouth once a day  #30 x 3   Entered and Authorized by:   Syliva Overman MD   Signed by:   Syliva Overman MD on 07/13/2010   Method used:   Printed then faxed to ...       [facility]  [address] [phone]* (retail)       [address]       [place], [place]  [address]       Ph: [phone]  or [phone]       Fax: [phone]   RxID:   [id number]

## 2010-11-20 NOTE — Progress Notes (Signed)
[facility]  [facility] Note Call from Patient   Reason for Call: Insurance Question Summary of Call: [facility] wants you to call her back at [phone] about her medicine Initial call taken by: Lind Guest,  July 17, 2010 12:44 PM  Follow-up for Phone Call        advised to d/c the lotrel and fill amlodipine 10 mg  Follow-up by: Everitt Amber LPN,  July 17, 2010 12:58 PM

## 2010-11-20 NOTE — Progress Notes (Signed)
  Phone Note Outgoing Call   Call placed by: dr simpson Summary of Call: attempted to call pt doxepin sent in instead of doxycycline msg left for her to call me back this evening or to call the office in the morning, will attempt to call her bck tonight also, correct med will be sent in also Initial call taken by: Syliva Overman MD,  August 27, 2010 8:09 PM  Follow-up for Phone Call        spoke with the pharmacist the pt had not yet collected the doxepin, asked that she too try sand contact Ms Rubye Oaks for her med Follow-up by: Syliva Overman MD,  August 27, 2010 8:14 PM    New/Updated Medications: DOXYCYCLINE HYCLATE 100 MG CAPS (DOXYCYCLINE HYCLATE) Take 1 capsule by mouth two times a day Prescriptions: DOXYCYCLINE HYCLATE 100 MG CAPS (DOXYCYCLINE HYCLATE) Take 1 capsule by mouth two times a day  #20 x 0   Entered and Authorized by:   Syliva Overman MD   Signed by:   Syliva Overman MD on 08/27/2010   Method used:   [facility]ronically to        [facility] [phone]* (retail)       [address]       [place], [place]  [address]       Ph: [phone] or [phone]       Fax: [phone]   RxID:   [id number]

## 2010-11-20 NOTE — Assessment & Plan Note (Signed)
Summary: follow up - room 3   Vital Signs:  Patient profile:   77 year old female Menstrual status:  hysterectomy Height:      68 inches Weight:      224 pounds BMI:     34.18 O2 Sat:      99 % on Room air Pulse rate:   55 / minute Resp:     16 per minute BP sitting:   130 / 80  (left arm)  Vitals Entered By: Adella Hare LPN (January 09, 2010 9:05 AM) CC: follow up Is Patient Diabetic? No Pain Assessment Patient in pain? no        CC:  follow up.  History of Present Illness: Pt is here today for check up. No complaints.  Is aware that her wt has gone up & doesnt' know what to do to change this.  Did give up fried foods for lent. Previously walked for exercise. But hasnt been exercising recently.  Hx of htn & hyperlipidemia.  Is taking meds.  No  probs with.  No chest pain, palp or difficulty breathing. Pts med list was updated.  She is no longer on diuretic & wonders if she still needs to take K+ daily.    Current Medications (verified): 1)  Amlodipine Besylate 5 Mg  Tabs (Amlodipine Besylate) .... One Tab By Mouth Once Daily 2)  Hydrochlorothiazide 25 Mg  Tabs (Hydrochlorothiazide) .... One Tab By Mouth Once Daily 3)  Oscal 500/200 D-3 500-200 Mg-Unit  Tabs (Calcium-Vitamin D) .... One Tab Po Three Times A Day 4)  Klor-Con 20 Meq Pack (Potassium Chloride) .... One Tab By Mouth Qd 5)  Lovastatin 20 Mg Tabs (Lovastatin) .... Take 1 Tab By Mouth At Bedtime  Allergies (verified): No Known Drug Allergies  Past History:  Past medical, surgical, family and social histories (including risk factors) reviewed, and no changes noted (except as noted below).  Past Medical History: Current Problems:  DEGENERATIVE JOINT DISEASE, SPINE (ICD-721.90) GLAUCOMA (ICD-365.9) HYPERTENSION (ICD-401.9) Fracture R ankle 03/21/08 Hyperlipidemia  Past Surgical History: Reviewed history from 09/29/2008 and no changes required. TAH and BSO (1990) Laser eye surgery both eyes  (2007) ORIF R ankle 03/23/08  (Dr.Dean)  Family History: Reviewed history from 01/27/2008 and no changes required. mom deceased  dad deceased mi sisters 2 living HTN,DM Brothers 1 HTN  Social History: Reviewed history from 01/27/2008 and no changes required. 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 Resp:  Denies cough and shortness of breath. GI:  Denies change in bowel habits, indigestion, nausea, and vomiting.  Physical Exam  General:  Well-developed,well-nourished,in no acute distress; alert,appropriate and cooperative throughout examination Head:  Normocephalic and atraumatic without obvious abnormalities. No apparent alopecia or balding. Ears:  External ear exam shows no significant lesions or deformities.  Otoscopic examination reveals clear canals, tympanic membranes are intact bilaterally without bulging, retraction, inflammation or discharge. Hearing is grossly normal bilaterally. Nose:  External nasal examination shows no deformity or inflammation. Nasal mucosa are pink and moist without lesions or exudates. Mouth:  Oral mucosa and oropharynx without lesions or exudates.  Neck:  No deformities, masses, or tenderness noted. Lungs:  Normal respiratory effort, chest expands symmetrically. Lungs are clear to auscultation, no crackles or wheezes. Heart:  Normal rate and regular rhythm. S1 and S2 normal without gallop, murmur, click, rub or other extra sounds. Cervical Nodes:  No lymphadenopathy noted Psych:  Cognition and judgment appear intact. Alert and cooperative with normal attention span and concentration. No apparent delusions, illusions, hallucinations   Impression & Recommendations:  Problem # 1:  HYPERTENSION (ICD-401.9) Assessment Improved  The following medications were removed from the medication list:    Hydrochlorothiazide 25 Mg Tabs (Hydrochlorothiazide) .....  One tab by mouth once daily Her updated medication list for this problem includes:    Amlodipine Besylate 5 Mg Tabs (Amlodipine besylate) ..... One tab by mouth once daily  Orders: T-Electrolytes (09811-91478)  BP today: 130/80 Prior BP: 140/80 (10/12/2009)  Labs Reviewed: K+: 4.1 (12/06/2008) Creat: : 0.80 (12/06/2008)   Chol: 201 (08/31/2009)   HDL: 53 (08/31/2009)   LDL: 135 (08/31/2009)   TG: 63 (08/31/2009)  Problem # 2:  HYPERLIPIDEMIA (ICD-272.4) Assessment: Comment Only  Her updated medication list for this problem includes:    Lovastatin 20 Mg Tabs (Lovastatin) .Marland Kitchen... Take 1 tab by mouth at bedtime  Labs Reviewed: SGOT: 24 (09/01/2009)   SGPT: 38 (09/01/2009)   HDL:53 (08/31/2009), 51 (12/06/2008)  LDL:135 (08/31/2009), 118 (12/06/2008)  Chol:201 (08/31/2009), 182 (12/06/2008)  Trig:63 (08/31/2009), 63 (12/06/2008)  Problem # 3:  OBESITY, UNSPECIFIED (ICD-278.00) Assessment: Deteriorated  Ht: 68 (01/09/2010)   Wt: 224 (01/09/2010)   BMI: 34.18 (01/09/2010) Discussed healthy diet & diet info given.  Encouraged walking for exercise.  Complete Medication List: 1)  Amlodipine Besylate 5 Mg Tabs (Amlodipine besylate) .... One tab by mouth once daily 2)  Oscal 500/200 D-3 500-200 Mg-unit Tabs (Calcium-vitamin d) .... One tab po three times a day 3)  Klor-con 20 Meq Pack (Potassium chloride) .... One tab by mouth qd 4)  Lovastatin 20 Mg Tabs (Lovastatin) .... Take 1 tab by mouth at bedtime 5)  Aspirin 325 Mg Tabs (Aspirin)  Patient Instructions: 1)  Please schedule a follow-up appointment in 3 months. 2)  It is important that you exercise regularly at least 20 minutes 5 times a week. If you develop chest pain, have severe difficulty breathing, or feel very tired , stop exercising immediately and seek medical attention. 3)  You need to lose weight. Consider a lower calorie diet and regular exercise.  4)  Discontinue your potassium since you are no longer on a water pill. 5)   Have blood work done in 4 weeks though to check your potassium level.

## 2010-11-22 NOTE — Assessment & Plan Note (Signed)
Summary: follow up 3 months/slj   Vital Signs:  Patient profile:   77 year old female Menstrual status:  hysterectomy Height:      68 inches Weight:      227 pounds BMI:     34.64 O2 Sat:      97 % Pulse rate:   82 / minute Pulse rhythm:   regular Resp:     16 per minute BP sitting:   130 / 72  (left arm) Cuff size:   large  Vitals Entered By: Everitt Amber LPN (November 02, 2010 8:04 AM)  Nutrition Counseling: Patient's BMI is greater than 25 and therefore counseled on weight management options. CC: Follow up chronic problems   CC:  Follow up chronic problems.  History of Present Illness: Reports  that tshe has been doing well, except for recent weight gain. The cyst on her shpulder was taken care of in the offfice with no incision needed. Denies recent fever or chills. Denies sinus pressure, nasal congestion , ear pain or sore throat. Denies chest congestion, or cough productive of sputum. Denies chest pain, palpitations, PND, orthopnea or leg swelling. Denies abdominal pain, nausea, vomitting, diarrhea or constipation. Denies change in bowel movements or bloody stool. Denies dysuria , frequency, incontinence or hesitancy. Denies  joint pain, swelling, or reduced mobility. Denies headaches, vertigo, seizures. Denies depression, anxiety or insomnia. Denies  rash, lesions, or itch.     Current Medications (verified): 1)  Aspirin 325 Mg Tabs (Aspirin) .... One Tab By Mouth Once Daily 2)  Metoprolol Succinate 25 Mg Xr24h-Tab (Metoprolol Succinate) .... Take 1 Tablet By Mouth Once A Day 3)  Amlodipine Besylate 10 Mg Tabs (Amlodipine Besylate) .... Take 1 Tablet By Mouth Once A Day 4)  Latanoprost 0.005 % Soln (Latanoprost) .... One Drop in Each Eye At Bedtime  Allergies (verified): 1)  ! Ace Inhibitors  Review of Systems      See HPI Eyes:  Denies discharge, eye pain, and red eye. Endo:  Denies cold intolerance, excessive hunger, excessive thirst, heat intolerance, and  polyuria. Heme:  Denies abnormal bruising and bleeding. Allergy:  Denies hives or rash and itching eyes.  Physical Exam  General:  Well-developed,well-nourished,in no acute distress; alert,appropriate and cooperative throughout examination HEENT: No facial asymmetry,  EOMI, No sinus tenderness, TM's Clear, oropharynx  pink and moist.   Chest: Clear to auscultation bilaterally.  CVS: S1, S2, No murmurs, No S3.   Abd: Soft, Nontender.  MS: Adequate ROM spine, hips, shoulders and knees.  Ext: No edema.   CNS: CN 2-12 intact, power tone and sensation normal throughout.   Skin: Intact, no visible lesions or rashes.  Psych: Good eye contact, normal affect.  Memory intact, not anxious or depressed appearing.    Impression & Recommendations:  Problem # 1:  HYPERLIPIDEMIA (ICD-272.4) Assessment Improved area totally healed, no residual collection  Problem # 2:  HYPERTENSION (ICD-401.9) Assessment: Improved  Her updated medication list for this problem includes:    Metoprolol Succinate 25 Mg Xr24h-tab (Metoprolol succinate) .Marland Kitchen... Take 1 tablet by mouth once a day    Amlodipine Besylate 10 Mg Tabs (Amlodipine besylate) .Marland Kitchen... Take 1 tablet by mouth once a day  BP today: 130/72 Prior BP: 164/80 (08/24/2010)  Labs Reviewed: K+: 4.5 (07/13/2010) Creat: : 0.81 (07/13/2010)   Chol: 187 (07/13/2010)   HDL: 51 (07/13/2010)   LDL: 123 (07/13/2010)   TG: 63 (07/13/2010)  Orders: Medicare Electronic Prescription [phone])  Problem # 3:  OBESITY, UNSPECIFIED (  ICD-278.00) Assessment: Deteriorated  Ht: 68 (11/02/2010)   Wt: 227 (11/02/2010)   BMI: 34.64 (11/02/2010) therapeutic lifestyle change discussed and encouraged  Complete Medication List: 1)  Aspirin 325 Mg Tabs (Aspirin) .... One tab by mouth once daily 2)  Metoprolol Succinate 25 Mg Xr24h-tab (Metoprolol succinate) .... Take 1 tablet by mouth once a day 3)  Amlodipine Besylate 10 Mg Tabs (Amlodipine besylate) .... Take 1 tablet by  mouth once a day 4)  Latanoprost 0.005 % Soln (Latanoprost) .... One drop in each eye at bedtime  Patient Instructions: 1)  CPE in 3.5 months 2)  It is important that you exercise regularly at least 30 minutes 6 times a week. If you develop chest pain, have severe difficulty breathing, or feel very tired , stop exercising immediately and seek medical attention. 3)  You need to lose weight. Consider a lower calorie diet and regular exercise.RETHINK FOOD!!  Goal is 5 to 7 pounds 4)  BP ios excellent 130/70, no med changes   Prescriptions: AMLODIPINE BESYLATE 10 MG TABS (AMLODIPINE BESYLATE) Take 1 tablet by mouth once a day  #30 x 3   Entered by:   Everitt Amber LPN   Authorized by:   Syliva Overman MD   Signed by:   Everitt Amber LPN on 78/46/9629   Method used:   Electronically to        [facility] [phone]* (retail)       [address]       [place], [place]  [address]       Ph: [phone]       Fax: [phone]   RxID:   [id number]    Orders Added: 1)  Est. Patient Level IV [41660] 2)  Medicare Electronic Prescription [phone]

## 2011-01-24 LAB — BASIC METABOLIC PANEL
CO2: 29 mEq/L (ref 19–32)
Calcium: 9 mg/dL (ref 8.4–10.5)
Creatinine, Ser: 0.89 mg/dL (ref 0.4–1.2)
GFR calc non Af Amer: >60 mL/min (ref >60)
Glucose, Bld: 95 mg/dL (ref 70–99)
Sodium: 140 mEq/L (ref 135–145)

## 2011-01-24 LAB — CBC
MCHC: 33.3 g/dL (ref 30.0–36.0)
Platelets: 276 10*3/uL (ref 150–400)
RDW: 15.8 % — ABNORMAL HIGH (ref 11.5–15.5)

## 2011-01-24 LAB — POCT CARDIAC MARKERS
CKMB, poc: 1.2 ng/mL (ref 1.0–8.0)
Myoglobin, poc: 119 ng/mL (ref 12–200)
Troponin i, poc: <0.05 ng/mL (ref 0.00–0.09)

## 2011-01-24 LAB — DIFFERENTIAL
Basophils Absolute: 0 10*3/uL (ref 0.0–0.1)
Basophils Relative: 1 % (ref 0–1)
Lymphocytes Relative: 32 % (ref 12–46)
Monocytes Absolute: 0.3 10*3/uL (ref 0.1–1.0)
Neutro Abs: 2.2 10*3/uL (ref 1.7–7.7)
Neutrophils Relative %: 52 % (ref 43–77)

## 2011-03-05 NOTE — Op Note (Signed)
NAME:  Tammy Galvan, Tammy Galvan                 ACCOUNT NO.:  [id number]   MEDICAL RECORD NO.:  [id number]          PATIENT TYPE:  INP   LOCATION:  5004                         FACILITY:  [facility]   PHYSICIAN:  Burnard Bunting, M.D.    DATE OF BIRTH:  11-Aug-1933   DATE OF PROCEDURE:  03/23/2008  DATE OF DISCHARGE:                               OPERATIVE REPORT   PREOPERATIVE DIAGNOSIS:  Right ankle bimalleolar fracture.   POSTOPERATIVE DIAGNOSIS:  Right ankle bimalleolar fracture.   PROCEDURE:  Right total ankle fracture open reduction and internal  fixation.   SURGEON:  Dr. Burnard Bunting, M.D.   ASSISTANT:  None.   ANESTHESIA:  General tracheal.   ESTIMATED BLOOD LOSS:  Minimal.   INDICATIONS:  [name] is a 77 year old female who fell and broke her  right ankle bowling.  She was reduced and sent for further management  and presents now for operative management of her unstable ankle fracture  after explanation of risks and benefits.   PROCEDURE IN DETAIL:  The patient brought to the operating room where  general endotracheal anesthesia was induced.  Time out was called.  Right leg was prescribed with alcohol and Betadine, was allowed to air  dry, and then prepped with DuraPrep solution and draped in sterile  manner.  Impervious dressing was used.  Preoperative IV antibiotics were  given.  Ankle Esmarch was utilized for approximately 45 minutes.  Lateral approach was made to the lateral malleolus.  Skin and  subcutaneous tissue sharply divided.  Care was taken to avoid injury to  the anterior branch of the superficial peroneal nerve.  Fracture was  identified.  The fracture site was irrigated and reduced.  A lag screw  was placed from anterior to posterior.  Lateral plate was then applied  with good reduction noted in the AP and lateral planes under  fluoroscopy.  At this time, the incision was thoroughly irrigated, and  attention was then directed towards the medial side.  A medial  incision  was made centered over the fracture.  Skin and subcutaneous tissues were  sharply divided.  Care was taken to avoid injury to the saphenous nerve  and vein.  Fracture was identified and irrigated.  No debris in the  joint.  Fracture was reduced and 2 malleolar screws were placed under  fluoroscopic guidance across the fracture with good reduction and  stability achieved.  At this time, the syndesmosis was tested and found  to be stable.  The ankle Esmarch was reduced.  Both incisions were  irrigated and closed using 2-0 Vicryl  suture and 3-0 nylon simple sutures.  Bulky posterior well-padded splint  was applied.  The patient tolerated the procedure well without immediate  complications.  All bleeding points encountered were controlled with  electrocautery prior to closures.      Burnard Bunting, M.D.  Electronically Signed     GSD/MEDQ  D:  03/23/2008  T:  03/24/2008  Job:  [id number]

## 2011-03-08 NOTE — Discharge Summary (Signed)
NAME:  Tammy Galvan, Tammy Galvan                 ACCOUNT NO.:  [id number]   MEDICAL RECORD NO.:  [id number]          PATIENT TYPE:  INP   LOCATION:  5004                         FACILITY:  [facility]   PHYSICIAN:  Burnard Bunting, M.D.    DATE OF BIRTH:  [birth year]   DATE OF ADMISSION:  03/23/2008  DATE OF DISCHARGE:  03/25/2008                               DISCHARGE SUMMARY   ADMISSION DIAGNOSES:  1. Right ankle bimalleolar fracture.  2. Hypertension.   DISCHARGE DIAGNOSIS:  1. Right ankle bimalleolar fracture.  2. Hypertension.   PROCEDURE:  On March 23, 2008, the patient underwent open reduction and  internal fixation of right ankle fracture performed by Dr. August Saucer under  general anesthesia.   CONSULTATIONS:  None.   BRIEF HISTORY:  Tammy Galvan is a 77 year old female who fell while  bowling and injured her right ankle.  She was noted to have a displaced  bimalleolar ankle fracture on radiographs which was felt to be an  unstable ankle fracture.  She was admitted for the procedure as above.   BRIEF HOSPITAL COURSE:  The patient tolerated the procedure under  general anesthesia without complications.  Postoperatively, she was  treated with ice and elevation.  Physical Therapy assisted the patient  with ambulation and gait training, strict nonweightbearing on the  operative extremity.  They did recommend home health physical therapy in  3:1 commode at discharge.  Arrangements were made for this equipment.  The patient was started on Coumadin for DVT prophylaxis.  Adjustments in  dosage made according to pro-times.  At discharge, INR 1.7.  On  admission, potassium 3.2, otherwise values normal.  CBC on admission was  within normal limits.  Urinalysis with 15 ketones and urine culture  showed 4000 colonies of insignificant growth.  EKG on admission, unusual  P axis, possible ectopic atrial rhythm.  EKG unconfirmed.  Pain was  controlled with p.o. analgesics.  She had excellent capillary refill and  sensation of her toes and her splint was noted to be intact.  She was  discharged to her home in stable condition on March 25, 2008.  At that  time, she was afebrile, vital signs were stable, the patient was voiding  independently, and comfortable with oral analgesics.   PLAN:  Arrangements were made for home health physical therapy and  durable medical equipment through [facility].  She was  instructed in strict elevation of the right foot when at rest and also  strict nonweightbearing for ambulation utilizing a walker.  She will  keep her splint dry and clean at all times.  Follow up with Dr. August Saucer 2  weeks from surgery.  The patient instructed to call to arrange the  appointment.   DISCHARGE MEDICATIONS:  1. Percocet 5/325 1-2 every 4-6 hours as needed for pain.  2. Coumadin 5 mg daily for 4 weeks.   She will resume home medications as taken prior to admission and was  given medication reconciliation form with these instructions.  She will  call the office with elevated fever, excessive swelling, redness, or  pain.  She is advised also to call if there are any other questions or  concerns prior to return office visit.   CONDITION ON DISCHARGE:  Stable.      Wende Neighbors, P.A.      Burnard Bunting, M.D.  Electronically Signed    SMV/MEDQ  D:  04/14/2008  T:  04/15/2008  Job:  [id number]

## 2011-03-29 ENCOUNTER — Encounter: Payer: Self-pay | Admitting: Family Medicine

## 2011-04-04 ENCOUNTER — Ambulatory Visit (INDEPENDENT_AMBULATORY_CARE_PROVIDER_SITE_OTHER): Payer: Medicare Other | Admitting: Family Medicine

## 2011-04-04 ENCOUNTER — Encounter: Payer: Self-pay | Admitting: Family Medicine

## 2011-04-04 VITALS — BP 142/70 | HR 60 | Resp 16 | Ht 68.0 in | Wt 224.0 lb

## 2011-04-04 DIAGNOSIS — I10 Essential (primary) hypertension: Secondary | ICD-10-CM

## 2011-04-04 DIAGNOSIS — H409 Unspecified glaucoma: Secondary | ICD-10-CM

## 2011-04-04 DIAGNOSIS — E785 Hyperlipidemia, unspecified: Secondary | ICD-10-CM

## 2011-04-04 DIAGNOSIS — M25571 Pain in right ankle and joints of right foot: Secondary | ICD-10-CM

## 2011-04-04 DIAGNOSIS — E669 Obesity, unspecified: Secondary | ICD-10-CM

## 2011-04-04 DIAGNOSIS — M25579 Pain in unspecified ankle and joints of unspecified foot: Secondary | ICD-10-CM

## 2011-04-04 MED ORDER — POTASSIUM CHLORIDE CRYS ER 20 MEQ PO TBCR: 20.0000 meq | EXTENDED_RELEASE_TABLET | Freq: Every day | ORAL | Status: DC

## 2011-04-04 MED ORDER — METOPROLOL SUCCINATE ER 25 MG PO TB24: 25.0000 mg | ORAL_TABLET | Freq: Every day | ORAL | Status: DC

## 2011-04-04 MED ORDER — AMLODIPINE BESYLATE 10 MG PO TABS: 10.0000 mg | ORAL_TABLET | Freq: Every day | ORAL | Status: DC

## 2011-04-04 MED ORDER — HYDROCHLOROTHIAZIDE 12.5 MG PO CAPS: 12.5000 mg | ORAL_CAPSULE | Freq: Every day | ORAL | Status: DC

## 2011-04-04 NOTE — Assessment & Plan Note (Signed)
Medication compliance addressed. Commitment to regular exercise and healthy  food choices, with portion control discussed. DASH diet and low fat diet discussed and literature offered. Changes in medication made at this visit.  

## 2011-04-04 NOTE — Progress Notes (Signed)
  Subjective:    Patient ID: Tammy Galvan, female    DOB: July 24, 1933, 77 y.o.   MRN: [id number]  HPI Pt reports 1 month h/o increased right ankle pain, and reduced mobility has a pin in it wants an x ray. She has no other expressed concerns and is here for f/u of her chronic conditions. Her weight has decreased very little, she is inconsistent in weight loss efforts.   Review of Systems Denies recent fever or chills. Denies sinus pressure, nasal congestion, ear pain or sore throat. Denies chest congestion, productive cough or wheezing. Denies chest pains, palpitations, paroxysmal nocturnal dyspnea, orthopnea and leg swelling Denies abdominal pain, nausea, vomiting,diarrhea or constipation.  Denies rectal bleeding or change in bowel movement. Denies dysuria, frequency, hesitancy or incontinence. Denies headaches, seizure, numbness, or tingling. Denies depression, anxiety or insomnia. Denies skin break down or rash.        Objective:   Physical Exam Patient alert and oriented and in no Cardiopulmonary distress.  HEENT: No facial asymmetry, EOMI, no sinus tenderness, TM's clear, Oropharynx pink and moist.  Neck supple no adenopathy.  Chest: Clear to auscultation bilaterally.  CVS: S1, S2 no murmurs, no S3.  ABD: Soft non tender. Bowel sounds normal.  Ext: No edema  MS: Adequate ROM spine, shoulders, hips and knees.Decreased in ankle , right , which is mildly swollen and tender Skin: Intact, no ulcerations or rash noted.  Psych: Good eye contact, normal affect. Memory intact not anxious or depressed appearing.  CNS: CN 2-12 intact, power, tone and sensation normal throughout.        Assessment & Plan:

## 2011-04-04 NOTE — Patient Instructions (Signed)
CPE in 6 to 8 weeks.  Your BP is high, you will continue your current med and start new  Also HCTZ and potassium.  Fasting labs just before next ov.  Xray of right ankle eval pain  It is important that you exercise regularly at least 30 minutes 5 times a week. If you develop chest pain, have severe difficulty breathing, or feel very tired, stop exercising immediately and seek medical attention  A healthy diet is rich in fruit, vegetables and whole grains. Poultry fish, nuts and beans are a healthy choice for protein rather then red meat. A low sodium diet and drinking 64 ounces of water daily is generally recommended. Oils and sweet should be limited. Carbohydrates especially for those who are diabetic or overweight, should be limited to 34-45 gram per meal. It is important to eat on a regular schedule, at least 3 times daily. Snacks should be primarily fruits, vegetables or nuts.

## 2011-04-14 NOTE — Assessment & Plan Note (Signed)
Increased in recent times, x ray ordered

## 2011-04-14 NOTE — Assessment & Plan Note (Signed)
Needs rept labs to determine control. Low fat diet discussed and encouraged

## 2011-04-14 NOTE — Assessment & Plan Note (Addendum)
Unchanged. Patient re-educated about  the importance of commitment to a  minimum of 150 minutes of exercise per week. The importance of healthy food choices with portion control discussed. Encouraged to start a food diary, count calories and to consider  joining a support group. Sample diet sheets offered. Goals set by the patient for the next several months.    

## 2011-04-18 ENCOUNTER — Ambulatory Visit (HOSPITAL_COMMUNITY)
Admission: RE | Admit: 2011-04-18 | Discharge: 2011-04-18 | Disposition: A | Discharge status: Home / Self Care | Payer: Medicare Other | Source: Ambulatory Visit | Attending: Family Medicine | Admitting: Family Medicine

## 2011-04-18 DIAGNOSIS — M25571 Pain in right ankle and joints of right foot: Secondary | ICD-10-CM

## 2011-04-18 DIAGNOSIS — M25473 Effusion, unspecified ankle: Secondary | ICD-10-CM | POA: Insufficient documentation

## 2011-04-18 DIAGNOSIS — M25476 Effusion, unspecified foot: Secondary | ICD-10-CM | POA: Insufficient documentation

## 2011-04-18 DIAGNOSIS — M25579 Pain in unspecified ankle and joints of unspecified foot: Secondary | ICD-10-CM | POA: Insufficient documentation

## 2011-04-18 DIAGNOSIS — M7730 Calcaneal spur, unspecified foot: Secondary | ICD-10-CM | POA: Insufficient documentation

## 2011-05-29 ENCOUNTER — Encounter: Payer: Self-pay | Admitting: Family Medicine

## 2011-05-30 ENCOUNTER — Encounter: Payer: Self-pay | Admitting: Family Medicine

## 2011-05-30 ENCOUNTER — Ambulatory Visit (INDEPENDENT_AMBULATORY_CARE_PROVIDER_SITE_OTHER): Payer: Medicare Other | Admitting: Family Medicine

## 2011-05-30 VITALS — BP 124/70 | HR 58 | Resp 16 | Ht 68.0 in | Wt 224.1 lb

## 2011-05-30 DIAGNOSIS — R7301 Impaired fasting glucose: Secondary | ICD-10-CM

## 2011-05-30 DIAGNOSIS — Z0000 Encounter for general adult medical examination without abnormal findings: Secondary | ICD-10-CM

## 2011-05-30 DIAGNOSIS — M949 Disorder of cartilage, unspecified: Secondary | ICD-10-CM

## 2011-05-30 DIAGNOSIS — R5383 Other fatigue: Secondary | ICD-10-CM

## 2011-05-30 DIAGNOSIS — M899 Disorder of bone, unspecified: Secondary | ICD-10-CM

## 2011-05-30 DIAGNOSIS — H9190 Unspecified hearing loss, unspecified ear: Secondary | ICD-10-CM | POA: Insufficient documentation

## 2011-05-30 DIAGNOSIS — R5381 Other malaise: Secondary | ICD-10-CM

## 2011-05-30 DIAGNOSIS — H409 Unspecified glaucoma: Secondary | ICD-10-CM

## 2011-05-30 DIAGNOSIS — Z1211 Encounter for screening for malignant neoplasm of colon: Secondary | ICD-10-CM

## 2011-05-30 DIAGNOSIS — E785 Hyperlipidemia, unspecified: Secondary | ICD-10-CM

## 2011-05-30 DIAGNOSIS — E039 Hypothyroidism, unspecified: Secondary | ICD-10-CM

## 2011-05-30 DIAGNOSIS — Z1239 Encounter for other screening for malignant neoplasm of breast: Secondary | ICD-10-CM

## 2011-05-30 DIAGNOSIS — I10 Essential (primary) hypertension: Secondary | ICD-10-CM

## 2011-05-30 DIAGNOSIS — Z13820 Encounter for screening for osteoporosis: Secondary | ICD-10-CM

## 2011-05-30 LAB — POC HEMOCCULT BLD/STL (OFFICE/1-CARD/DIAGNOSTIC): Fecal Occult Blood, POC: NEGATIVE

## 2011-05-30 NOTE — Patient Instructions (Addendum)
F/u in 3 months.  Fasting labs just before next visit  LABWORK  NEEDS TO BE DONE BETWEEN 3 TO 7 DAYS BEFORE YOUR NEXT SCEDULED  VISIT.  THIS WILL IMPROVE THE QUALITY OF YOUR CARE.  Pls attempt to change your way of eating so that you lose weight and feel healthier.  It is important that you exercise regularly at least 30 minutes 5 times a week. If you develop chest pain, have severe difficulty breathing, or feel very tired, stop exercising immediately and seek medical attention   No changes in medication   You are being referred for mammogram and bone density test and hearing eval  Blood ptressure is excellent

## 2011-06-02 NOTE — Assessment & Plan Note (Signed)
Controlled, no change in medication  

## 2011-06-02 NOTE — Progress Notes (Signed)
  Subjective:    Patient ID: Tammy Galvan, female    DOB: November 26, 1932, 77 y.o.   MRN: [id number]  HPI The PT is here for  Annual examand re-evaluation of chronic medical conditions, medication management and review of any available recent lab and radiology data.  Preventive health is updated, specifically  Cancer screening and Immunization.   . The PT denies any adverse reactions to current medications since the last visit.  There are no new concerns.  C/o worsening hearing loss, has had hearing aid in the past    Review of Systems Denies recent fever or chills. Denies sinus pressure, nasal congestion, ear pain or sore throat. Denies chest congestion, productive cough or wheezing. Denies chest pains, palpitations and leg swelling Denies abdominal pain, nausea, vomiting,diarrhea or constipation.   Denies dysuria, frequency, hesitancy or incontinence. Intermittent  joint pain, and limitation in mobility. Denies headaches, seizures, numbness, or tingling. Denies depression, anxiety or insomnia. Denies skin break down or rash.        Objective:   Physical Exam Pleasant well nourished female, alert and oriented x 3, in no cardio-pulmonary distress. Afebrile. HEENT No facial trauma or asymetry. Sinuses non tender.  EOMI, PERTL,.  External ears normal, tympanic membranes clear. Oropharynx moist, no exudate Neck: supple, no adenopathy,JVD or thyromegaly.No bruits.  Chest: Clear to ascultation bilaterally.No crackles or wheezes. Non tender to palpation  Breast: No asymetry,no masses. No nipple discharge or inversion. No axillary or supraclavicular adenopathy  Cardiovascular system; Heart sounds normal,  S1 and  S2 ,no S3.  No murmur, or thrill. Apical beat not displaced Peripheral pulses normal.  Abdomen: Soft, non tender, no organomegaly or masses. No bruits. Bowel sounds normal. No guarding, tenderness or rebound.  Rectal:  No mass. Guaiac negative  stool.  GU: Pt has TAH, refuses exam, and not indicated  Musculoskeletal exam: Decreased ROM of spine,adequate in  hips , shoulders and knees. No deformity ,swelling or crepitus noted. No muscle wasting or atrophy.   Neurologic: Cranial nerves 2 to 12 intact.Hearing loss present grossly, pt obviously relies a lot on lip reading Power, tone ,sensation and reflexes normal throughout. No disturbance in gait. No tremor.  Skin: Intact, no ulceration, erythema , scaling or rash noted. Pigmentation normal throughout  Psych; Normal mood and affect. Judgement and concentration normal        Assessment & Plan:

## 2011-06-02 NOTE — Assessment & Plan Note (Signed)
Followed closely by opthalmology, and reports good pressures

## 2011-06-02 NOTE — Assessment & Plan Note (Signed)
Deteriorating, pt reports increased lip reading, will refer for ENT eval

## 2011-07-18 LAB — BASIC METABOLIC PANEL
CO2: 29
GFR calc non Af Amer: >60
Glucose, Bld: 90
Potassium: 3.2 — ABNORMAL LOW
Sodium: 140

## 2011-07-18 LAB — PROTIME-INR
INR: 1.2
INR: 1.7 — ABNORMAL HIGH
Prothrombin Time: 13.4
Prothrombin Time: 15.7 — ABNORMAL HIGH
Prothrombin Time: 20.1 — ABNORMAL HIGH

## 2011-07-18 LAB — CBC
HCT: 39.9
Hemoglobin: 13.1
MCHC: 32.9
RDW: 14.9

## 2011-07-18 LAB — URINALYSIS, ROUTINE W REFLEX MICROSCOPIC
Bilirubin Urine: NEGATIVE
Hgb urine dipstick: NEGATIVE
Ketones, ur: 15 — AB
Protein, ur: NEGATIVE
Urobilinogen, UA: 0.2

## 2011-07-18 LAB — URINE CULTURE

## 2011-08-10 ENCOUNTER — Other Ambulatory Visit: Payer: Self-pay | Admitting: Family Medicine

## 2011-08-22 ENCOUNTER — Ambulatory Visit (HOSPITAL_COMMUNITY)
Admission: RE | Admit: 2011-08-22 | Discharge: 2011-08-22 | Disposition: A | Discharge status: Home / Self Care | Payer: Medicare Other | Source: Ambulatory Visit | Attending: Family Medicine | Admitting: Family Medicine

## 2011-08-22 DIAGNOSIS — Z1239 Encounter for other screening for malignant neoplasm of breast: Secondary | ICD-10-CM

## 2011-08-22 DIAGNOSIS — Z1231 Encounter for screening mammogram for malignant neoplasm of breast: Secondary | ICD-10-CM | POA: Insufficient documentation

## 2011-08-24 LAB — CBC WITH DIFFERENTIAL/PLATELET
Basophils Absolute: 0 10*3/uL (ref 0.0–0.1)
Basophils Relative: 1 % (ref 0–1)
Eosinophils Relative: 8 % — ABNORMAL HIGH (ref 0–5)
Lymphocytes Relative: 39 % (ref 12–46)
MCHC: 30.9 g/dL (ref 30.0–36.0)
MCV: 86.2 fL (ref 78.0–100.0)
Neutro Abs: 2.4 10*3/uL (ref 1.7–7.7)
Platelets: 317 10*3/uL (ref 150–400)
RDW: 15.9 % — ABNORMAL HIGH (ref 11.5–15.5)
WBC: 5.4 10*3/uL (ref 4.0–10.5)

## 2011-08-24 LAB — LIPID PANEL
Cholesterol: 176 mg/dL (ref 0–200)
HDL: 49 mg/dL (ref >39)
LDL Cholesterol: 114 mg/dL — ABNORMAL HIGH (ref 0–99)
Triglycerides: 65 mg/dL (ref <150)

## 2011-08-24 LAB — BASIC METABOLIC PANEL
Calcium: 8.9 mg/dL (ref 8.4–10.5)
Chloride: 106 mEq/L (ref 96–112)
Creat: 1 mg/dL (ref 0.50–1.10)

## 2011-08-24 LAB — HEMOGLOBIN A1C: Hgb A1c MFr Bld: 6 % — ABNORMAL HIGH (ref <5.7)

## 2011-08-24 LAB — VITAMIN D 25 HYDROXY (VIT D DEFICIENCY, FRACTURES): Vit D, 25-Hydroxy: 15 ng/mL — ABNORMAL LOW (ref 30–89)

## 2011-08-29 ENCOUNTER — Encounter: Payer: Self-pay | Admitting: Family Medicine

## 2011-08-30 ENCOUNTER — Encounter: Payer: Self-pay | Admitting: Family Medicine

## 2011-08-30 ENCOUNTER — Ambulatory Visit (INDEPENDENT_AMBULATORY_CARE_PROVIDER_SITE_OTHER): Payer: Medicare Other | Admitting: Family Medicine

## 2011-08-30 VITALS — BP 120/82 | HR 71 | Resp 16 | Ht 68.0 in | Wt 224.0 lb

## 2011-08-30 DIAGNOSIS — R7309 Other abnormal glucose: Secondary | ICD-10-CM

## 2011-08-30 DIAGNOSIS — Z23 Encounter for immunization: Secondary | ICD-10-CM

## 2011-08-30 DIAGNOSIS — E785 Hyperlipidemia, unspecified: Secondary | ICD-10-CM

## 2011-08-30 DIAGNOSIS — I10 Essential (primary) hypertension: Secondary | ICD-10-CM

## 2011-08-30 DIAGNOSIS — H409 Unspecified glaucoma: Secondary | ICD-10-CM

## 2011-08-30 DIAGNOSIS — R7301 Impaired fasting glucose: Secondary | ICD-10-CM

## 2011-08-30 DIAGNOSIS — E559 Vitamin D deficiency, unspecified: Secondary | ICD-10-CM

## 2011-08-30 DIAGNOSIS — R7303 Prediabetes: Secondary | ICD-10-CM

## 2011-08-30 MED ORDER — VITAMIN D3 1.25 MG (50000 UT) PO CAPS: 50000.0000 [IU] | ORAL_CAPSULE | ORAL | Status: DC

## 2011-08-30 MED ORDER — PRAVASTATIN SODIUM 20 MG PO TABS: 20.0000 mg | ORAL_TABLET | Freq: Every evening | ORAL | Status: DC

## 2011-08-30 NOTE — Patient Instructions (Addendum)
F/u in 4 months.  HBA1C in 4 month  You are prediabetic, you need to attend a class at the hospital, pls call and register.  A healthy diet is rich in fruit, vegetables and whole grains. Poultry fish, nuts and beans are a healthy choice for protein rather then red meat. A low sodium diet and drinking 64 ounces of water daily is generally recommended. Oils and sweet should be limited. Carbohydrates especially for those who are diabetic or overweight, should be limited to 30-45 gram per meal. It is important to eat on a regular schedule, at least 3 times daily. Snacks should be primarily fruits, vegetables or nuts.  Flu vaccine today  New meds are once weekly vitamin D and pravastatin  You need to take calcium with D 1200mg /1000IU once daily for bones  It is important that you exercise regularly at least 30 minutes 5 times a week. If you develop chest pain, have severe difficulty breathing, or feel very tired, stop exercising immediately and seek medical attention

## 2011-08-30 NOTE — Progress Notes (Signed)
  Subjective:    Patient ID: Tammy Galvan, female    DOB: 02-Aug-1933, 77 y.o.   MRN: [id number]  HPI The PT is here for follow up and re-evaluation of chronic medical conditions, medication management and review of any available recent lab and radiology data.Unfortunately, recent labs show she is prediabetic with a hBA1C of 6.0, so most of the visit is spent addressing the significance of this and changes which need to be made to avert progression to diabetes  Preventive health is updated, specifically  Cancer screening and Immunization.   Questions or concerns regarding consultations or procedures which the PT has had in the interim are  addressed. The PT denies any adverse reactions to current medications since the last visit.  There are no new concerns.  There are no specific complaints       Review of Systems Denies recent fever or chills. Denies sinus pressure, nasal congestion, ear pain or sore throat. Denies chest congestion, productive cough or wheezing. Denies chest pains, palpitations and leg swelling Denies abdominal pain, nausea, vomiting,diarrhea or constipation.   Denies dysuria, frequency, hesitancy or incontinence. Denies joint pain, swelling and limitation in mobility. Denies headaches, seizures, numbness, or tingling. Denies depression, anxiety or insomnia. Denies skin break down or rash.        Objective:   Physical Exam Patient alert and oriented and in no cardiopulmonary distress.  HEENT: No facial asymmetry, EOMI, no sinus tenderness,  oropharynx pink and moist.  Neck supple no adenopathy.  Chest: Clear to auscultation bilaterally.  CVS: S1, S2 no murmurs, no S3.  ABD: Soft non tender. Bowel sounds normal.  Ext: No edema  MS: Adequate ROM spine, shoulders, hips and knees.  Skin: Intact, no ulcerations or rash noted.  Psych: Good eye contact, normal affect. Memory intact not anxious or depressed appearing.  CNS: CN 2-12 intact, power, tone and  sensation normal throughout.        Assessment & Plan:

## 2011-08-31 DIAGNOSIS — R7303 Prediabetes: Secondary | ICD-10-CM | POA: Insufficient documentation

## 2011-08-31 NOTE — Assessment & Plan Note (Signed)
Controlled, no change in medication  

## 2011-08-31 NOTE — Assessment & Plan Note (Signed)
Followed closely by opthalmology 

## 2011-08-31 NOTE — Assessment & Plan Note (Signed)
New diagnosis. Pt education done in the office , and she is also referred to group session , rept HBa1C in 4 month

## 2011-08-31 NOTE — Assessment & Plan Note (Signed)
Low fat diet discussed and encouraged 

## 2011-09-25 ENCOUNTER — Other Ambulatory Visit: Payer: Self-pay | Admitting: Family Medicine

## 2011-10-22 DIAGNOSIS — R7303 Prediabetes: Secondary | ICD-10-CM

## 2011-10-22 HISTORY — DX: Prediabetes: R73.03

## 2011-10-30 ENCOUNTER — Other Ambulatory Visit: Payer: Self-pay | Admitting: Family Medicine

## 2011-12-09 ENCOUNTER — Other Ambulatory Visit: Payer: Self-pay | Admitting: Family Medicine

## 2011-12-31 ENCOUNTER — Ambulatory Visit (INDEPENDENT_AMBULATORY_CARE_PROVIDER_SITE_OTHER): Payer: Medicare Other | Admitting: Family Medicine

## 2011-12-31 ENCOUNTER — Encounter: Payer: Self-pay | Admitting: Family Medicine

## 2011-12-31 VITALS — BP 140/80 | HR 71 | Resp 16 | Ht 68.0 in | Wt 225.0 lb

## 2011-12-31 DIAGNOSIS — E559 Vitamin D deficiency, unspecified: Secondary | ICD-10-CM

## 2011-12-31 DIAGNOSIS — I10 Essential (primary) hypertension: Secondary | ICD-10-CM

## 2011-12-31 DIAGNOSIS — R7309 Other abnormal glucose: Secondary | ICD-10-CM

## 2011-12-31 DIAGNOSIS — R5381 Other malaise: Secondary | ICD-10-CM

## 2011-12-31 DIAGNOSIS — R7301 Impaired fasting glucose: Secondary | ICD-10-CM

## 2011-12-31 DIAGNOSIS — R7303 Prediabetes: Secondary | ICD-10-CM

## 2011-12-31 DIAGNOSIS — E785 Hyperlipidemia, unspecified: Secondary | ICD-10-CM

## 2011-12-31 DIAGNOSIS — E669 Obesity, unspecified: Secondary | ICD-10-CM

## 2011-12-31 DIAGNOSIS — R5383 Other fatigue: Secondary | ICD-10-CM

## 2011-12-31 DIAGNOSIS — E039 Hypothyroidism, unspecified: Secondary | ICD-10-CM

## 2011-12-31 NOTE — Patient Instructions (Addendum)
F/u in mid July  Call if you need me before  HBA1C, tSh, chem 7 fasting and Vitr d in July  It is important that you exercise regularly at least 30 minutes 5 times a week. If you develop chest pain, have severe difficulty breathing, or feel very tired, stop exercising immediately and seek medical attention   A healthy diet is rich in fruit, vegetables and whole grains. Poultry fish, nuts and beans are a healthy choice for protein rather then red meat. A low sodium diet and drinking 64 ounces of water daily is generally recommended. Oils and sweet should be limited. Carbohydrates especially for those who are diabetic or overweight, should be limited to 34-45 gram per meal. It is important to eat on a regular schedule, at least 3 times daily. Snacks should be primarily fruits, vegetables or nuts.   You need to make appt for the class about diabetes, since you are at high risk of becoming diabetic. You absolutely need to change how you eat and lose weight also

## 2011-12-31 NOTE — Assessment & Plan Note (Signed)
Sub optimal control, no med change 

## 2012-01-05 NOTE — Assessment & Plan Note (Signed)
Deteriorated. Patient re-educated about  the importance of commitment to a  minimum of 150 minutes of exercise per week. The importance of healthy food choices with portion control discussed. Encouraged to start a food diary, count calories and to consider  joining a support group. Sample diet sheets offered. Goals set by the patient for the next several months.    

## 2012-01-05 NOTE — Assessment & Plan Note (Signed)
Hyperlipidemia:Low fat diet discussed and encouraged.  No med change, though LDL is high

## 2012-01-05 NOTE — Assessment & Plan Note (Signed)
Deteriorating, importance of lifestyle change with weight loss stressed

## 2012-01-05 NOTE — Progress Notes (Signed)
  Subjective:    Patient ID: Tammy Galvan, female    DOB: 09/26/1933, 78 y.o.   MRN: [id number]  HPI The PT is here for follow up and re-evaluation of chronic medical conditions, medication management and review of any available recent lab and radiology data.  Preventive health is updated, specifically  Cancer screening and Immunization.   Questions or concerns regarding consultations or procedures which the PT has had in the interim are  addressed. The PT denies any adverse reactions to current medications since the last visit.  There are no new concerns.  There are no specific complaints       Review of Systems    See HPI Denies recent fever or chills. Denies sinus pressure, nasal congestion, ear pain or sore throat. Denies chest congestion, productive cough or wheezing. Denies chest pains, palpitations and leg swelling Denies abdominal pain, nausea, vomiting,diarrhea or constipation.   Denies dysuria, frequency, hesitancy or incontinence. Denies joint pain, swelling and limitation in mobility. Denies headaches, seizures, numbness, or tingling. Denies depression, anxiety or insomnia. Denies skin break down or rash.     Objective:   Physical Exam  Patient alert and oriented and in no cardiopulmonary distress.  HEENT: No facial asymmetry, EOMI, no sinus tenderness,  oropharynx pink and moist.  Neck supple no adenopathy.  Chest: Clear to auscultation bilaterally.  CVS: S1, S2 no murmurs, no S3.  ABD: Soft non tender. Bowel sounds normal.  Ext: No edema  MS: Adequate ROM spine, shoulders, hips and knees.  Skin: Intact, no ulcerations or rash noted.  Psych: Good eye contact, normal affect. Memory intact not anxious or depressed appearing.  CNS: CN 2-12 intact, power, tone and sensation normal throughout.       Assessment & Plan:

## 2012-03-25 ENCOUNTER — Encounter: Payer: Self-pay | Admitting: Family Medicine

## 2012-03-25 ENCOUNTER — Ambulatory Visit (INDEPENDENT_AMBULATORY_CARE_PROVIDER_SITE_OTHER): Payer: Medicare Other | Admitting: Family Medicine

## 2012-03-25 VITALS — BP 130/72 | HR 81 | Resp 18 | Ht 68.0 in | Wt 222.0 lb

## 2012-03-25 DIAGNOSIS — J040 Acute laryngitis: Secondary | ICD-10-CM | POA: Insufficient documentation

## 2012-03-25 DIAGNOSIS — J40 Bronchitis, not specified as acute or chronic: Secondary | ICD-10-CM

## 2012-03-25 DIAGNOSIS — I10 Essential (primary) hypertension: Secondary | ICD-10-CM

## 2012-03-25 MED ORDER — AZITHROMYCIN 250 MG PO TABS: ORAL_TABLET | ORAL | Status: AC

## 2012-03-25 MED ORDER — GUAIFENESIN-CODEINE 100-10 MG/5ML PO SYRP: 5.0000 mL | ORAL_SOLUTION | Freq: Two times a day (BID) | ORAL | Status: DC | PRN

## 2012-03-25 NOTE — Assessment & Plan Note (Signed)
Will treat for bronchitis- as pt not improving, cough suppressant given, zpak

## 2012-03-25 NOTE — Progress Notes (Addendum)
  Subjective:    Patient ID: Tammy Galvan, female    DOB: 1933-09-04, 78 y.o.   MRN: [id number]  HPI  Cough with mild production x 1 week, continues to get worse, keeps her up at night, no fever, mild sore throat, no nasal drainage. Denies SOB, non smoker  Tried OTC nyquil, dayquil with minimal relief   Review of Systems   GEN- denies fatigue, fever, weight loss,weakness, recent illness HEENT- denies eye drainage, change in vision, nasal discharge, CVS- denies chest pain, palpitations RESP- denies SOB, +cough, wheeze ABD- denies N/V, change in stools, abd pain Neuro- denies headache, dizziness, syncope, seizure activity      Objective:   Physical Exam GEN- NAD, alert and oriented x3, hoarse voice HEENT- PERRL, EOMI, non injected sclera, pink conjunctiva, MMM, oropharynx clear, TM clear bilat, +sinus pressure  Neck- Supple, shotty LAD anterior chain CVS- RRR, no murmur RESP-CTAB, harsh cough  Pulses- Radial 2+        Assessment & Plan:

## 2012-03-25 NOTE — Assessment & Plan Note (Signed)
Supportive care. 

## 2012-03-25 NOTE — Assessment & Plan Note (Signed)
BP stable though pt has been taking OTC meds, no change to meds

## 2012-03-25 NOTE — Patient Instructions (Signed)
I am treating for bronchitis, take the antibiotics and cough medicine  Drink plenty of fluids and rest  F/U if not improved

## 2012-04-19 ENCOUNTER — Other Ambulatory Visit: Payer: Self-pay | Admitting: Family Medicine

## 2012-05-04 ENCOUNTER — Ambulatory Visit (INDEPENDENT_AMBULATORY_CARE_PROVIDER_SITE_OTHER): Payer: Medicare Other | Admitting: Family Medicine

## 2012-05-04 ENCOUNTER — Encounter: Payer: Self-pay | Admitting: Family Medicine

## 2012-05-04 ENCOUNTER — Other Ambulatory Visit: Payer: Self-pay

## 2012-05-04 VITALS — BP 120/70 | HR 81 | Resp 16 | Ht 68.0 in | Wt 222.0 lb

## 2012-05-04 DIAGNOSIS — E669 Obesity, unspecified: Secondary | ICD-10-CM

## 2012-05-04 DIAGNOSIS — I10 Essential (primary) hypertension: Secondary | ICD-10-CM

## 2012-05-04 DIAGNOSIS — R7309 Other abnormal glucose: Secondary | ICD-10-CM

## 2012-05-04 DIAGNOSIS — R7303 Prediabetes: Secondary | ICD-10-CM

## 2012-05-04 DIAGNOSIS — J40 Bronchitis, not specified as acute or chronic: Secondary | ICD-10-CM

## 2012-05-04 DIAGNOSIS — E785 Hyperlipidemia, unspecified: Secondary | ICD-10-CM

## 2012-05-04 DIAGNOSIS — E559 Vitamin D deficiency, unspecified: Secondary | ICD-10-CM

## 2012-05-04 DIAGNOSIS — Z1211 Encounter for screening for malignant neoplasm of colon: Secondary | ICD-10-CM

## 2012-05-04 MED ORDER — OYSTER SHELL CALCIUM/D 500-200 MG-UNIT PO TABS: 1.0000 | ORAL_TABLET | Freq: Three times a day (TID) | ORAL | Status: DC

## 2012-05-04 MED ORDER — AMLODIPINE BESYLATE 10 MG PO TABS: ORAL_TABLET | ORAL | Status: DC

## 2012-05-04 MED ORDER — POTASSIUM CHLORIDE CRYS ER 20 MEQ PO TBCR: EXTENDED_RELEASE_TABLET | ORAL | Status: DC

## 2012-05-04 MED ORDER — HYDROCHLOROTHIAZIDE 12.5 MG PO CAPS: ORAL_CAPSULE | ORAL | Status: DC

## 2012-05-04 NOTE — Patient Instructions (Addendum)
Annual wellness exam in 2 month  Fasting lipid, cmp , hBA1C and vit d in 2 month  Weight loss goal of 4 to 6 pounds  It is important that you exercise regularly at least 30 minutes 5 times a week. If you develop chest pain, have severe difficulty breathing, or feel very tired, stop exercising immediately and seek medical attention   A healthy diet is rich in fruit, vegetables and whole grains. Poultry fish, nuts and beans are a healthy choice for protein rather then red meat. A low sodium diet and drinking 64 ounces of water daily is generally recommended. Oils and sweet should be limited. Carbohydrates especially for those who are diabetic or overweight, should be limited to 30-45 gram per meal. It is important to eat on a regular schedule, at least 3 times daily. Snacks should be primarily fruits, vegetables or nuts.   You are referred for screening colonoscopy

## 2012-05-04 NOTE — Progress Notes (Signed)
  Subjective:    Patient ID: Tammy Galvan, female    DOB: July 30, 1933, 78 y.o.   MRN: [id number]  HPI The PT is here for follow up and re-evaluation of chronic medical conditions, medication management and review of any available recent lab and radiology data.  Preventive health is updated, specifically  Cancer screening and Immunization.   Questions or concerns regarding consultations or procedures which the PT has had in the interim are  Addressed.Recently treated for acute bronchitis, and her symptoms have fully resolved The PT denies any adverse reactions to current medications since the last visit.  There are no new concerns.  There are no specific complaints       Review of Systems See HPI Denies recent fever or chills. Denies sinus pressure, nasal congestion, ear pain or sore throat. Denies chest congestion, productive cough or wheezing. Denies chest pains, palpitations and leg swelling Denies abdominal pain, nausea, vomiting,diarrhea or constipation.   Denies dysuria, frequency, hesitancy or incontinence. Denies joint pain, swelling and limitation in mobility. Denies headaches, seizures, numbness, or tingling. Denies depression, anxiety or insomnia. Denies skin break down or rash.        Objective:   Physical Exam   Patient alert and oriented and in no cardiopulmonary distress.  HEENT: No facial asymmetry, EOMI, no sinus tenderness,  oropharynx pink and moist.  Neck supple no adenopathy.  Chest: Clear to auscultation bilaterally.  CVS: S1, S2 no murmurs, no S3.  ABD: Soft non tender. Bowel sounds normal.  Ext: No edema  MS: Adequate ROM spine, shoulders, hips and knees.  Skin: Intact, no ulcerations or rash noted.  Psych: Good eye contact, normal affect. Memory intact not anxious or depressed appearing.  CNS: CN 2-12 intact, power, tone and sensation normal throughout.      Assessment & Plan:

## 2012-05-04 NOTE — Assessment & Plan Note (Signed)
Hyperlipidemia:Low fat diet discussed and encouraged.  Updated labs for next visit 

## 2012-05-04 NOTE — Assessment & Plan Note (Signed)
Resolved fully, asymptomatic

## 2012-05-04 NOTE — Assessment & Plan Note (Signed)
Low carb diet , exercise and weight loss stressed, to prevent diabetes

## 2012-05-04 NOTE — Assessment & Plan Note (Signed)
Controlled, no change in medication  

## 2012-05-04 NOTE — Assessment & Plan Note (Signed)
Unchanged. Patient re-educated about  the importance of commitment to a  minimum of 150 minutes of exercise per week. The importance of healthy food choices with portion control discussed. Encouraged to start a food diary, count calories and to consider  joining a support group. Sample diet sheets offered. Goals set by the patient for the next several months.    

## 2012-05-05 ENCOUNTER — Ambulatory Visit: Payer: Medicare Other | Admitting: Family Medicine

## 2012-05-05 ENCOUNTER — Encounter (INDEPENDENT_AMBULATORY_CARE_PROVIDER_SITE_OTHER): Payer: Self-pay | Admitting: *Deleted

## 2012-05-13 ENCOUNTER — Other Ambulatory Visit (INDEPENDENT_AMBULATORY_CARE_PROVIDER_SITE_OTHER): Payer: Self-pay | Admitting: *Deleted

## 2012-05-13 ENCOUNTER — Telehealth (INDEPENDENT_AMBULATORY_CARE_PROVIDER_SITE_OTHER): Payer: Self-pay | Admitting: *Deleted

## 2012-05-13 ENCOUNTER — Encounter (INDEPENDENT_AMBULATORY_CARE_PROVIDER_SITE_OTHER): Payer: Self-pay | Admitting: *Deleted

## 2012-05-13 DIAGNOSIS — Z1211 Encounter for screening for malignant neoplasm of colon: Secondary | ICD-10-CM

## 2012-05-13 NOTE — Telephone Encounter (Signed)
Patient needs movi prep 

## 2012-05-14 MED ORDER — PEG-KCL-NACL-NASULF-NA ASC-C 100 G PO SOLR: 1.0000 | Freq: Once | ORAL | Status: DC

## 2012-05-23 ENCOUNTER — Other Ambulatory Visit: Payer: Self-pay | Admitting: Family Medicine

## 2012-05-28 ENCOUNTER — Encounter (INDEPENDENT_AMBULATORY_CARE_PROVIDER_SITE_OTHER): Payer: Self-pay | Admitting: *Deleted

## 2012-05-28 ENCOUNTER — Encounter (HOSPITAL_COMMUNITY): Payer: Self-pay | Admitting: Dietician

## 2012-05-28 NOTE — Progress Notes (Signed)
[facility] Diabetes Class Completion  Date:May 28, 2012  Time: 6:30 PM  Pt attended [facility]'s Diabetes Class on May 28, 2012.   Patient was educated on the following topics: carbohydrate metabolism in relation to diabetes, sources of carbohydrate, carbohydrate counting, meal planning strategies, food label reading, and portion control.   Zaron Zwiefelhofer A. Kayan, RD, LDN Date:May 28, 2012 Time: 6:30 PM 

## 2012-06-15 ENCOUNTER — Encounter (HOSPITAL_COMMUNITY): Payer: Self-pay | Admitting: Pharmacy Technician

## 2012-06-17 ENCOUNTER — Telehealth (INDEPENDENT_AMBULATORY_CARE_PROVIDER_SITE_OTHER): Payer: Self-pay | Admitting: *Deleted

## 2012-06-17 NOTE — Telephone Encounter (Signed)
PCP/Requesting MD: simpson  Name & DOB: Maurice Litton 05-Sep-2033     Procedure: tcs  Reason/Indication:  screening  Has patient had this procedure before?  yes  If so, when, by whom and where?  About 10 yrs ago  Is there a family history of colon cancer?  no  Who?  What age when diagnosed?    Is patient diabetic?   no      Does patient have prosthetic heart valve?  no  Do you have a pacemaker?  no  Has patient had joint replacement within last 12 months?  no  Is patient on Coumadin, Plavix and/or Aspirin? yes  Medications: asa 81 mg daily, amlodipine 10 mg daily, hctz 12.5 mg daily, klor con 20 meq daily, metoprolol 25 mg daily, pravastatin 20 mg daily, oscal w/ D, brimonidine combigan eye drops bid  Allergies: ace inhibitors  Medication Adjustment: asa 2 days  Procedure date & time: 07/01/12 at 830

## 2012-06-19 NOTE — Telephone Encounter (Signed)
agree

## 2012-07-01 ENCOUNTER — Encounter (HOSPITAL_COMMUNITY): Payer: Self-pay | Admitting: *Deleted

## 2012-07-01 ENCOUNTER — Encounter (HOSPITAL_COMMUNITY)
Admission: RE | Disposition: A | Discharge status: Home / Self Care | Payer: Self-pay | Source: Ambulatory Visit | Attending: Internal Medicine

## 2012-07-01 ENCOUNTER — Ambulatory Visit (HOSPITAL_COMMUNITY)
Admission: RE | Admit: 2012-07-01 | Discharge: 2012-07-01 | Disposition: A | Discharge status: Home / Self Care | Payer: Medicare Other | Source: Ambulatory Visit | Attending: Internal Medicine | Admitting: Internal Medicine

## 2012-07-01 DIAGNOSIS — D126 Benign neoplasm of colon, unspecified: Secondary | ICD-10-CM

## 2012-07-01 DIAGNOSIS — Z79899 Other long term (current) drug therapy: Secondary | ICD-10-CM | POA: Insufficient documentation

## 2012-07-01 DIAGNOSIS — I10 Essential (primary) hypertension: Secondary | ICD-10-CM | POA: Insufficient documentation

## 2012-07-01 DIAGNOSIS — K644 Residual hemorrhoidal skin tags: Secondary | ICD-10-CM | POA: Insufficient documentation

## 2012-07-01 DIAGNOSIS — Z1211 Encounter for screening for malignant neoplasm of colon: Secondary | ICD-10-CM

## 2012-07-01 DIAGNOSIS — K6389 Other specified diseases of intestine: Secondary | ICD-10-CM

## 2012-07-01 DIAGNOSIS — E785 Hyperlipidemia, unspecified: Secondary | ICD-10-CM | POA: Insufficient documentation

## 2012-07-01 DIAGNOSIS — Z6835 Body mass index (BMI) 35.0-35.9, adult: Secondary | ICD-10-CM | POA: Insufficient documentation

## 2012-07-01 DIAGNOSIS — E669 Obesity, unspecified: Secondary | ICD-10-CM | POA: Insufficient documentation

## 2012-07-01 HISTORY — PX: COLONOSCOPY: SHX5424

## 2012-07-01 SURGERY — COLONOSCOPY
Anesthesia: Moderate Sedation

## 2012-07-01 MED ORDER — STERILE WATER FOR IRRIGATION IR SOLN
Status: DC | PRN
  Administered 2012-07-01: 09:00:00

## 2012-07-01 MED ORDER — MEPERIDINE HCL 50 MG/ML IJ SOLN
INTRAMUSCULAR | Status: DC | PRN
  Administered 2012-07-01: 25 mg via INTRAVENOUS

## 2012-07-01 MED ORDER — MIDAZOLAM HCL 5 MG/5ML IJ SOLN
INTRAMUSCULAR | Status: AC
  Filled 2012-07-01: qty 10

## 2012-07-01 MED ORDER — SODIUM CHLORIDE 0.45 % IV SOLN
Freq: Once | INTRAVENOUS | Status: AC
  Administered 2012-07-01: 1000 mL via INTRAVENOUS

## 2012-07-01 MED ORDER — MIDAZOLAM HCL 5 MG/5ML IJ SOLN
INTRAMUSCULAR | Status: DC | PRN
  Administered 2012-07-01 (×2): 2 mg via INTRAVENOUS

## 2012-07-01 MED ORDER — MEPERIDINE HCL 50 MG/ML IJ SOLN
INTRAMUSCULAR | Status: AC
  Filled 2012-07-01: qty 1

## 2012-07-01 NOTE — Op Note (Signed)
COLONOSCOPY PROCEDURE REPORT  PATIENT:  Tammy Galvan  MR#:  [id number] Birthdate:  09-28-1933, 78 y.o., female Endoscopist:  Dr. Malissa Hippo, MD Referred By:  Dr. Syliva Overman, MD Procedure Date: 07/01/2012  Procedure:   Colonoscopy  Indications:  Patient is 78 year old African female who is here for average risk screening colonoscopy.  Informed Consent:  The procedure and risks were reviewed with the patient and informed consent was obtained.  Medications:  Demerol 25 mg IV Versed 4 mg IV  Description of procedure:  After a digital rectal exam was performed, that colonoscope was advanced from the anus through the rectum and colon to the area of the cecum, ileocecal valve and appendiceal orifice. The cecum was deeply intubated. These structures were well-seen and photographed for the record. From the level of the cecum and ileocecal valve, the scope was slowly and cautiously withdrawn. The mucosal surfaces were carefully surveyed utilizing scope tip to flexion to facilitate fold flattening as needed. The scope was pulled down into the rectum where a thorough exam including retroflexion was performed.  Findings:   Prep excellent. Small polyp ablated via cold biopsy from splenic flexure. Normal rectal mucosa. Small hemorrhoids below the dentate line along with two anal papillae.  Therapeutic/Diagnostic Maneuvers Performed:  See above  Complications:  None  Cecal Withdrawal Time:  8 minutes  Impression:  Examination performed to cecum. Small polyp ablated via cold biopsy from splenic flexure. Small external hemorrhoids along with two anal papillae.   Recommendations:  Standard instructions given. I will contact patient with results of biopsy and further recommendations.   Sharleen Szczesny U  07/01/2012 9:03 AM  CC: Dr. Syliva Overman, MD & Dr. Bonnetta Barry ref. provider found

## 2012-07-01 NOTE — H&P (Signed)
Tammy Galvan is an 78 y.o. female.   Chief Complaint: Patient is here for colonoscopy. HPI: Patient is 78 year old African female who is in for average risk screening colonoscopy. Patient's last exam was about 10 years ago. She denies abdominal pain change in bowel habits or rectal bleeding. Him history is negative for colorectal carcinoma.  Past Medical History  Diagnosis Date  . Thyroid disease   . Hyperlipidemia   . Hypertension   . Glaucoma   . Obesity   . TIA (transient ischemic attack)     Past Surgical History  Procedure Date  . Abdominal hysterectomy   . Fracture surgery     right ankle  . Cataract extraction     Family History  Problem Relation Age of Onset  . Arthritis Mother   . Diabetes Father   . Heart disease Father   . Stroke Sister   . Diabetes Sister    Social History:  reports that she has never smoked. She does not have any smokeless tobacco history on file. She reports that she does not drink alcohol or use illicit drugs.  Allergies:  Allergies  Allergen Reactions  . Ace Inhibitors Other (See Comments)    Cough     Medications Prior to Admission  Medication Sig Dispense Refill  . amLODipine (NORVASC) 10 MG tablet Take 10 mg by mouth daily.      Marland Kitchen aspirin 325 MG tablet Take 325 mg by mouth daily.        . brimonidine-timolol (COMBIGAN) 0.2-0.5 % ophthalmic solution Place 1 drop into both eyes 2 (two) times daily.      . calcium-vitamin D (OSCAL WITH D) 500-200 MG-UNIT TABS Take 1 tablet by mouth 2 (two) times daily.      . hydrochlorothiazide (HYDRODIURIL) 12.5 MG tablet Take 12.5 mg by mouth daily.      . metoprolol succinate (TOPROL-XL) 25 MG 24 hr tablet Take 25 mg by mouth daily.      . peg 3350 powder (MOVIPREP) 100 G SOLR Take 1 kit (100 g total) by mouth once.  1 kit  0  . potassium chloride SA (K-DUR,KLOR-CON) 20 MEQ tablet Take 20 mEq by mouth daily.      . pravastatin (PRAVACHOL) 20 MG tablet Take 1 tablet (20 mg total) by mouth every  evening.  30 tablet  11    No results found for this or any previous visit (from the past 48 hour(s)). No results found.  ROS  Blood pressure 150/70, pulse 57, temperature 98.1 F (36.7 C), temperature source Oral, resp. rate 18, height 5\' 8"  (1.727 m), weight 220 lb (99.791 kg), SpO2 97.00%. Physical Exam  Constitutional: She appears well-developed and well-nourished.  Eyes: Conjunctivae normal are normal. No scleral icterus.  Neck: No thyromegaly present.  Cardiovascular: Normal rate, regular rhythm and normal heart sounds.   No murmur heard. Respiratory: Effort normal and breath sounds normal.  GI: Soft. She exhibits no distension and no mass. There is no tenderness.  Musculoskeletal: She exhibits no edema.  Lymphadenopathy:    She has no cervical adenopathy.  Neurological: She is alert.  Skin: Skin is warm and dry.     Assessment/Plan Average risk screening colonoscopy.  Tammy Galvan U 07/01/2012, 8:34 AM

## 2012-07-01 NOTE — Progress Notes (Signed)
Pt called Short Stay after arriving home to state we lost her ring while she was a patient earlier this am. Pt was demanding a check for reimbursement for ring. After talking with her preop RN, it was discovered that pt gave her ring to family member present at bedside. Pt was called at home to relay this information. States she had her ring.

## 2012-07-02 LAB — COMPREHENSIVE METABOLIC PANEL
Albumin: 3.9 g/dL (ref 3.5–5.2)
Alkaline Phosphatase: 74 U/L (ref 39–117)
CO2: 25 mEq/L (ref 19–32)
Glucose, Bld: 93 mg/dL (ref 70–99)
Potassium: 4.5 mEq/L (ref 3.5–5.3)
Sodium: 140 mEq/L (ref 135–145)
Total Protein: 6.4 g/dL (ref 6.0–8.3)

## 2012-07-02 LAB — LIPID PANEL: Total CHOL/HDL Ratio: 3.4 Ratio

## 2012-07-02 LAB — HEMOGLOBIN A1C: Mean Plasma Glucose: 134 mg/dL — ABNORMAL HIGH (ref <117)

## 2012-07-03 LAB — VITAMIN D 25 HYDROXY (VIT D DEFICIENCY, FRACTURES): Vit D, 25-Hydroxy: 29 ng/mL — ABNORMAL LOW (ref 30–89)

## 2012-07-07 ENCOUNTER — Encounter (HOSPITAL_COMMUNITY): Payer: Self-pay | Admitting: Internal Medicine

## 2012-07-08 ENCOUNTER — Encounter: Payer: Self-pay | Admitting: Family Medicine

## 2012-07-08 ENCOUNTER — Ambulatory Visit (INDEPENDENT_AMBULATORY_CARE_PROVIDER_SITE_OTHER): Payer: Medicare Other | Admitting: Family Medicine

## 2012-07-08 ENCOUNTER — Telehealth (HOSPITAL_COMMUNITY): Payer: Self-pay | Admitting: Dietician

## 2012-07-08 VITALS — BP 120/74 | HR 60 | Resp 18 | Ht 68.0 in | Wt 221.1 lb

## 2012-07-08 DIAGNOSIS — R7303 Prediabetes: Secondary | ICD-10-CM

## 2012-07-08 DIAGNOSIS — Z23 Encounter for immunization: Secondary | ICD-10-CM

## 2012-07-08 DIAGNOSIS — R7309 Other abnormal glucose: Secondary | ICD-10-CM

## 2012-07-08 DIAGNOSIS — Z0000 Encounter for general adult medical examination without abnormal findings: Secondary | ICD-10-CM | POA: Insufficient documentation

## 2012-07-08 DIAGNOSIS — I10 Essential (primary) hypertension: Secondary | ICD-10-CM

## 2012-07-08 DIAGNOSIS — E039 Hypothyroidism, unspecified: Secondary | ICD-10-CM

## 2012-07-08 NOTE — Assessment & Plan Note (Signed)
Pt had annual exam as documented. She is highly functional , no depression, memory impairment, or high fall risk

## 2012-07-08 NOTE — Telephone Encounter (Signed)
Received referral via fax from Dr. Lodema Hong for dx: obesity, prediabetes. Noted pt attended group diabetes class on 05/28/2012.

## 2012-07-08 NOTE — Telephone Encounter (Signed)
Appointment scheduled for 07/14/12 at 3:00 PM.

## 2012-07-08 NOTE — Patient Instructions (Addendum)
F/u in 4 month  Weight loss goal of 2 to 2.5 pounds per month   You are referred for individual counseling on diet and also we will explain how to count carbs and provide information  Please cut back on fried and fatty foods and red meat, you bad cholesterol is still a bit high  It is important that you exercise regularly at least 30 minutes 5 times a week. If you develop chest pain, have severe difficulty breathing, or feel very tired, stop exercising immediately and seek medical attention  A healthy diet is rich in fruit, vegetables and whole grains. Poultry fish, nuts and beans are a healthy choice for protein rather then red meat. A low sodium diet and drinking 64 ounces of water daily is generally recommended. Oils and sweet should be limited. Carbohydrates especially for those who are diabetic or overweight, should be limited to 30-45 gram per meal. It is important to eat on a regular schedule, at least 3 times daily. Snacks should be primarily fruits, vegetables or nuts.  HBA1C, cbc and TSH in 4 month, non fasting.  Flu vaccine today, you also need the shingles vaccine

## 2012-07-08 NOTE — Progress Notes (Signed)
Subjective:    Patient ID: Tammy Galvan, female    DOB: April 08, 1933, 78 y.o.   MRN: [id number]  HPI Preventive Screening-Counseling & Management   Patient present here today for a Medicare annual wellness visit. Blood pressure as well as recent lab work were also reviewed, She is prediabetic, HBA1C has increased. Referral for individual counseling and the importance of weight loss stressed. \LDL slightly elevated, no med change , just dietary change   Current Problems (verified)   Medications Prior to Visit Allergies (verified)   PAST HISTORY  Family History  Social History Widow x 12 years. Mother of 3 adult children all healthy. Never nicotine, alcohol, or street drugs. 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 No  Over the past two weeks, have you felt little interest or pleasure in doing things? No  Have you lost interest or pleasure in daily life? No  Do you often feel hopeless? No  Do you cry easily over simple problems? No   Activities of Daily Living  In your present state of health, do you have any difficulty performing the following activities?  Driving?: No Managing money?: No Feeding yourself?:No Getting from bed to chair?:No Climbing a flight of stairs?:No Preparing food and eating?:No Bathing or showering?:No Getting dressed?:No Getting to the toilet?:No Using the toilet?:No Moving around from place to place?: No  Fall Risk Assessment In the past  year have you fallen or had a near fall?:No Are you currently taking any medications that make you dizziness?:No   Hearing Difficulties: No Do you often ask people to speak up or repeat themselves?:No Do you experience ringing or noises in your ears?:No Do you have difficulty understanding soft or whispered voices?:No  Cognitive Testing  Alert? Yes Normal Appearance?Yes  Oriented to person? Yes Place? Yes  Time? Yes  Displays appropriate judgment?Yes  Can read the correct time from a watch face? yes Are you having problems remembering things?No  Advanced Directives have been discussed with the patient?Yes , full code, states she does not want to be maintained on life support in the event of brain death or incurable illness, family aware , but no legal documentation   List the Names of Other Physician/Practitioners you currently use: Dr Nile Riggs, Dr Karilyn Cota   Indicate any recent Medical Services you may have received from other than [facility] providers in the past year (date may be approximate).   Assessment:    Annual Wellness Exam   Plan:    During the course of the visit the patient was educated and counseled about appropriate screening and preventive services including:  A healthy diet is rich in fruit, vegetables and whole grains. Poultry fish, nuts and beans are a healthy choice for protein rather then red meat. A low sodium diet and drinking 64 ounces of water daily is generally recommended. Oils and sweet should be limited. Carbohydrates especially for those who are diabetic or overweight, should be limited to 30-45  gram per meal. It is important to eat on a regular schedule, at least 3 times daily. Snacks should be primarily fruits, vegetables or nuts. It is important that you exercise regularly at least 30 minutes 5 times a week. If you develop chest pain, have severe difficulty breathing, or feel very tired, stop exercising immediately and seek medical attention  Immunization  reviewed and updated. Cancer screening reviewed and updated    Patient Instructions (the written plan) was given to the patient.  Medicare Attestation  I have personally reviewed:  The patient's medical and social history  Their use of alcohol, tobacco or illicit drugs  Their current medications and supplements  The patient's functional ability including ADLs,fall risks, home safety risks, cognitive, and hearing and visual impairment  Diet and physical activities  Evidence for depression or mood disorders  The patient's weight, height, BMI, and visual acuity have been recorded in the chart. I have made referrals, counseling, and provided education to the patient based on review of the above and I have provided the patient with a written personalized care plan for preventive services.     Review of Systems     Objective:   Physical Exam        Assessment & Plan:

## 2012-07-14 ENCOUNTER — Encounter (HOSPITAL_COMMUNITY): Payer: Self-pay | Admitting: Dietician

## 2012-07-14 NOTE — Progress Notes (Signed)
Outpatient Initial Nutrition Assessment  Date:07/14/2012   Appt Start Time: 1450  Referring Physician: Dr. Lodema Hong Reason for Visit: obesity, prediabetes  Nutrition Assessment:  Height: 5\' 8"  (172.7 cm)   Weight: 220 lb (99.791 kg)   IBW: 140# %IBW: 157% UBW: 200# %UBW: 100% Body mass index is 33.45 kg/(m^2).  Goal Weight: 198# (10% weight loss) Weight hx: Pt reports highest weight of 225# "afew months ago". She reports that she maintains her weight in the 220's, but it is always "up and down". She reports that her lowest about weight was 145#, which she maintained until she had children.   Estimated nutritional needs: 1732-1889 kcals daily, 80-100 grams protein daily, 1.7-1.9 L fluid daily  PMH:  Past Medical History  Diagnosis Date  . Thyroid disease   . Hyperlipidemia   . Hypertension   . Glaucoma   . Obesity   . TIA (transient ischemic attack)   . Prediabetes 2013    Medications:  Current Outpatient Rx  Name Route Sig Dispense Refill  . AMLODIPINE BESYLATE 10 MG PO TABS Oral Take 10 mg by mouth daily.    . ASPIRIN 325 MG PO TABS Oral Take 325 mg by mouth daily.      Marland Kitchen BRIMONIDINE TARTRATE-TIMOLOL 0.2-0.5 % OP SOLN Both Eyes Place 1 drop into both eyes 2 (two) times daily.    . OYSTER SHELL CALCIUM/D 500-200 MG-UNIT PO TABS Oral Take 1 tablet by mouth 2 (two) times daily.    Marland Kitchen HYDROCHLOROTHIAZIDE 12.5 MG PO TABS Oral Take 12.5 mg by mouth daily.    Marland Kitchen METOPROLOL SUCCINATE ER 25 MG PO TB24 Oral Take 25 mg by mouth daily.    Marland Kitchen POTASSIUM CHLORIDE CRYS ER 20 MEQ PO TBCR Oral Take 20 mEq by mouth daily.    Marland Kitchen PRAVASTATIN SODIUM 20 MG PO TABS Oral Take 1 tablet (20 mg total) by mouth every evening. 30 tablet 11    Labs: CMP     Component Value Date/Time   NA 140 07/02/2012 1000   K 4.5 07/02/2012 1000   CL 107 07/02/2012 1000   CO2 25 07/02/2012 1000   GLUCOSE 93 07/02/2012 1000   BUN 11 07/02/2012 1000   CREATININE 0.87 07/02/2012 1000   CREATININE 0.81 07/13/2010 1840   CALCIUM 9.5 07/02/2012 1000   PROT 6.4 07/02/2012 1000   ALBUMIN 3.9 07/02/2012 1000   AST 19 07/02/2012 1000   ALT 14 07/02/2012 1000   ALKPHOS 74 07/02/2012 1000   BILITOT 0.5 07/02/2012 1000   GFRNONAA >60 08/17/2009 1040   GFRAA  Value: >60        The eGFR has been calculated using the MDRD equation. This calculation has not been validated in all clinical situations. eGFR's persistently <60 mL/min signify possible Chronic Kidney Disease. 08/17/2009 1040    Lipid Panel     Component Value Date/Time   CHOL 174 07/02/2012 1000   TRIG 71 07/02/2012 1000   HDL 51 07/02/2012 1000   CHOLHDL 3.4 07/02/2012 1000   VLDL 14 07/02/2012 1000   LDLCALC 109* 07/02/2012 1000     Lab Results  Component Value Date   HGBA1C 6.3* 07/02/2012   HGBA1C 6.0* 08/23/2011   Lab Results  Component Value Date   LDLCALC 109* 07/02/2012   CREATININE 0.87 07/02/2012     Lifestyle/ social habits: Ms. Bloodgood lives in [place] with her 80 year old grandson. She has been a widow for 12 years. She is a retired first Surveyor, quantity. She  reports her stress level as 5/10, she says she is not stressed, but "overcommitted". 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 She reports she has started exercising at the [facility], participating in the [employer] class, but admits she does not go as often as she should. She also plays shuffleboard on Fridays.   Nutrition hx/habits: Ms. Volante reports frustration with weight loss and fear of developing diabetes. Noted that Hgb A1C has raised 0.3 points x 10 months. She attended the group diabetes class on 05/28/12. She is interested in developing a healthier lifestyle and she is "tired of getting meds". She reports that she has tried different diets in the past, including Weight Watchers and NutriSystem, but she has never committed to a particular plan. She cooks at home and only eats out about once a week. She admits difficulty  preparing meals for 1 person. She "eats what's there" because she hates to "waste food". She also admits to frequent snacking at volunteer outings. She reports all she has had to eat all day is a cookie. She drinks mostly water  Diet recall: Breakfast (10:30 AM): "anything in the fridge" (chicken and greens), water; Lunch (12:30 PM): same; Dinner: hamburger with coleslaw, lettuce, onion  Nutrition Diagnosis: Inconsistent carbohydrate intake r/t excessive snacking AEB Hgb A1c: 6.2.   Nutrition Intervention: Nutrition rx: 1500 kcal NAS, diabetic diet; 3 meals per day (45-60 grams carbohydrate per meal); 1 snack (15-30 grams carbohydrate per snack); 2.5 hours physical activity per week; low calorie beverages only  Education/Counseling Provided: Pt able to recall most information learned from diabetes class. Reviewed this information with her. Most of visit was spent counseling on lifestyle changes to achieve a more healthy weight and prevent onset of diabetes. Discussed with pt that skipping meals is not the answer to weight loss. Encouraged 3 meals per day and including lean meats, vegetables, fruits, whole grains, and low fat dairy at most meals. Discussed ideas to incorporate more fruits and vegetables in diet. Discussed slow, moderate weight loss of 0.5-2# per week. Set goals. Discussed importance with healthful diet along with regular physical activity to promote most optimal results. Discussed lifestyle changes vs fad diet to maintain weight loss. Provided plate method handout.  Understanding, Motivation, Ability to Follow Recommendations: Expect fair to good compliance.   Monitoring and Evaluation: Goals: 1) 1-2# weight loss per week; 2) 2.5 hours physical activity per week; Hgb A1c < 6.3  Recommendations:1) For weight loss: 1232-1359 kcals daily; 2) Set time on calendar/schedule to fit in exercise classes (3 times per week)  F/U: 4-6 weeks. Follow-up appointment scheduled for 08/10/12 at 3:00  PM.  Melody Haver, RD, LDN 07/14/2012  Appt EndTime: 1615

## 2012-08-04 ENCOUNTER — Telehealth (HOSPITAL_COMMUNITY): Payer: Self-pay | Admitting: Dietician

## 2012-08-04 NOTE — Telephone Encounter (Signed)
Mailed appointment confirmation letter and instructions for appointment scheduled 08/10/12 at 3:00 PM via [place] Mail.

## 2012-08-10 ENCOUNTER — Encounter (HOSPITAL_COMMUNITY): Payer: Self-pay | Admitting: Dietician

## 2012-08-10 NOTE — Progress Notes (Signed)
Follow-Up Outpatient Nutrition Note Date: 08/10/2012  Appt Start Time: 1458  Nutrition Assessment:  Current weight: Weight: 224 lb (101.606 kg)  BMI: Body mass index is 34.06 kg/(m^2).  Weight changes: +4# (1.8%) x 1 month  Ms. Epling has unfortunately gained weight since last visit. She continues to be "busy all of the time".  She reports she is trying to watch her diet more closely. She reports she has avoided fried foods and salt. She is eating more fruit and vegetables. She reports she is cutting back on sweets, but admits to indulging more than she should. She reports that he various social functions have not helped her make wiser choices.  She reports that she did exercise about 1-2 times since last visit, but reports classes have been suspended until the rec center finishes renovations. She identified other ways to exercise while this is being done, including walking with friends at [employer] and using her exercise bike while she watches television. She prefers company or watching TV when exercising as it "makes it go by faster".   Labs: CMP     Component Value Date/Time   NA 140 07/02/2012 1000   K 4.5 07/02/2012 1000   CL 107 07/02/2012 1000   CO2 25 07/02/2012 1000   GLUCOSE 93 07/02/2012 1000   BUN 11 07/02/2012 1000   CREATININE 0.87 07/02/2012 1000   CREATININE 0.81 07/13/2010 1840   CALCIUM 9.5 07/02/2012 1000   PROT 6.4 07/02/2012 1000   ALBUMIN 3.9 07/02/2012 1000   AST 19 07/02/2012 1000   ALT 14 07/02/2012 1000   ALKPHOS 74 07/02/2012 1000   BILITOT 0.5 07/02/2012 1000   GFRNONAA >60 08/17/2009 1040   GFRAA  Value: >60        The eGFR has been calculated using the MDRD equation. This calculation has not been validated in all clinical situations. eGFR's persistently <60 mL/min signify possible Chronic Kidney Disease. 08/17/2009 1040    Lipid Panel     Component Value Date/Time   CHOL 174 07/02/2012 1000   TRIG 71 07/02/2012 1000   HDL 51 07/02/2012 1000   CHOLHDL 3.4 07/02/2012  1000   VLDL 14 07/02/2012 1000   LDLCALC 109* 07/02/2012 1000     Lab Results  Component Value Date   HGBA1C 6.3* 07/02/2012   HGBA1C 6.0* 08/23/2011   Lab Results  Component Value Date   LDLCALC 109* 07/02/2012   CREATININE 0.87 07/02/2012    Nutrition Diagnosis: Inconsistent carbohydrate intake r/t excessive snacking continues  Nutrition Intervention: Nutrition rx: 1500 kcal NAS, diabetic diet; 3 meals per day (45-60 grams carbohydrate per meal); 1 snack (15-30 grams carbohydrate per snack); 2.5 hours physical activity per week; low calorie beverages only  Education/ counseling provided: Most of the visit was spent counseling on lifestyle changes. Discussed ways for pt to incorporate physical activity into daily routine. Also reviewed importance of reading food labels and portion sizes. Discussed pros and cons of choosing sugar free over conventional items. Discussed importance of eating more fruits, vegetables, whole grains and choosing desserts less often. Discussed lower calorie alternatives to less healthier food choices. Discussed ways to make the most out of her food budget (choosing in-season produce and sale items). Discussed ways to incorporate eating out and other social activities into lifestyle while trying to maintain weight.    Understanding/Motivation/ Ability to follow recommendations: Expect fair compliance.   Monitoring and Evaluation: Previous Goals: 1) 1-2# weight loss per week- goal not met; 2) 2.5 hours  physical activity per week- goal not met; 3) Hgb A1c < 6.3- unable to assess  Goals for next visit: 1) 1-2# weight loss per week; 2) 2.5 hours physical activity per week; 3) Hgb A1c < 6.3  Recommendations: 1) Aim for 30 minutes of physical activity 5 times per week (go walking or do exercise bike while rec center is being renovated); 2) Choose one high calorie item when going out then try to eat healthfully the rest of the week; 3) Continue to use planner to schedule  exercise  F/U: 1 month. Follow up scheduled for 09/07/12 at 3:00 PM.   Melody Haver, RD, LDN Date:08/10/2012 Appt EndTime: 1600

## 2012-09-02 ENCOUNTER — Telehealth (HOSPITAL_COMMUNITY): Payer: Self-pay | Admitting: Dietician

## 2012-09-02 NOTE — Telephone Encounter (Signed)
Mailed appointment confirmation letter and instructions for appointment scheduled 09/07/12 at 3:00 PM via [place] Mail.

## 2012-09-07 ENCOUNTER — Telehealth (HOSPITAL_COMMUNITY): Payer: Self-pay | Admitting: Dietician

## 2012-09-07 NOTE — Telephone Encounter (Signed)
Pt cancelled appointment for today, 09/07/12 at 3:00 PM. Declined offer to reschedule at this time. Reports she would be interested in rescheduling after the 1st of the year.

## 2012-11-09 ENCOUNTER — Ambulatory Visit: Payer: Medicare Other | Admitting: Family Medicine

## 2013-01-02 ENCOUNTER — Other Ambulatory Visit: Payer: Self-pay | Admitting: Family Medicine

## 2013-01-14 ENCOUNTER — Telehealth: Payer: Self-pay | Admitting: Family Medicine

## 2013-01-14 NOTE — Telephone Encounter (Signed)
Got permission from pt verbally to speak with her daughter. Attem[pted to spk with daughter, left a message for her to call

## 2013-01-15 ENCOUNTER — Telehealth: Payer: Self-pay | Admitting: Family Medicine

## 2013-01-15 NOTE — Telephone Encounter (Signed)
pls schedule appt for pt to see me next week, I have left several messages for her daughter and have not got a response yet. Pt has not been in for 6 month, she cancelled Jan appt says she wants to come in

## 2013-01-15 NOTE — Telephone Encounter (Signed)
Called patients cell number and home number spoke with daughter Liborio Nixon she will talk with her mother and call back and make appointment

## 2013-01-19 ENCOUNTER — Ambulatory Visit (INDEPENDENT_AMBULATORY_CARE_PROVIDER_SITE_OTHER): Payer: Medicare Other | Admitting: Family Medicine

## 2013-01-19 ENCOUNTER — Other Ambulatory Visit: Payer: Self-pay | Admitting: Family Medicine

## 2013-01-19 ENCOUNTER — Encounter: Payer: Self-pay | Admitting: Family Medicine

## 2013-01-19 VITALS — BP 120/72 | HR 60 | Resp 18 | Ht 68.0 in | Wt 224.0 lb

## 2013-01-19 DIAGNOSIS — R7309 Other abnormal glucose: Secondary | ICD-10-CM

## 2013-01-19 DIAGNOSIS — E039 Hypothyroidism, unspecified: Secondary | ICD-10-CM

## 2013-01-19 DIAGNOSIS — R7303 Prediabetes: Secondary | ICD-10-CM

## 2013-01-19 DIAGNOSIS — Z139 Encounter for screening, unspecified: Secondary | ICD-10-CM

## 2013-01-19 DIAGNOSIS — E785 Hyperlipidemia, unspecified: Secondary | ICD-10-CM

## 2013-01-19 DIAGNOSIS — I10 Essential (primary) hypertension: Secondary | ICD-10-CM

## 2013-01-19 DIAGNOSIS — E669 Obesity, unspecified: Secondary | ICD-10-CM

## 2013-01-19 NOTE — Patient Instructions (Addendum)
F/u end September with rectal exam, please call if you need me before  Please check [facility], [facility] or [facility] for zostavax  Blood pressure is excellent.  Please commit to daily exercise for 30 minutes, to improve health and help with weight loss   Please schedule your mammogram, past due  HBA1C fasting lipid, cmp, TSH and CBC this week  HBA1C and chem 7 end September

## 2013-01-19 NOTE — Assessment & Plan Note (Addendum)
Hyperlipidemia:Low fat diet discussed and encouraged.  Updated lab needed, has been on statin in the past due to TIA history, just stopped taking because she kept forgetting night med  Re educated about perceived multiple statin benefits, will resume when lab are available

## 2013-01-19 NOTE — Progress Notes (Signed)
  Subjective:    Patient ID: Tammy Galvan, female    DOB: August 05, 1933, 79 y.o.   MRN: [id number]  HPI The PT is here for follow up and re-evaluation of chronic medical conditions, medication management and review of any available recent lab and radiology data.  Preventive health is updated, specifically  Cancer screening and Immunization.   Questions or concerns regarding consultations or procedures which the PT has had in the interim are  addressed. The PT denies any adverse reactions to current medications since the last visit.  There are no new concerns. Only concern is inability to lose weight , has not been as physically active as in the past, also struggles with consistency in change in eatinghabits There are no specific complaints       Review of Systems See HPI Denies recent fever or chills. Denies sinus pressure, nasal congestion, ear pain or sore throat. Denies chest congestion, productive cough or wheezing. Denies chest pains, palpitations and leg swelling Denies abdominal pain, nausea, vomiting,diarrhea or constipation.   Denies dysuria, frequency, hesitancy or incontinence. Denies joint pain, swelling and limitation in mobility. Denies headaches, seizures, numbness, or tingling. Denies depression, anxiety or insomnia. Denies skin break down or rash.        Objective:   Physical Exam  Patient alert and oriented and in no cardiopulmonary distress.  HEENT: No facial asymmetry, EOMI, no sinus tenderness,  oropharynx pink and moist.  Neck supple no adenopathy.  Chest: Clear to auscultation bilaterally.  CVS: S1, S2 no murmurs, no S3.  ABD: Soft non tender. Bowel sounds normal.  Ext: No edema  MS: Adequate ROM spine, shoulders, hips and knees.  Skin: Intact, no ulcerations or rash noted.  Psych: Good eye contact, normal affect. Memory intact not anxious or depressed appearing.  CNS: CN 2-12 intact, power, tone and sensation normal throughout.        Assessment & Plan:

## 2013-01-19 NOTE — Telephone Encounter (Signed)
Patient was seen 4.1.2014 by Dr. Lodema Hong

## 2013-01-19 NOTE — Assessment & Plan Note (Signed)
Updated lab needed.  

## 2013-01-19 NOTE — Assessment & Plan Note (Signed)
Unchanged. Patient re-educated about  the importance of commitment to a  minimum of 150 minutes of exercise per week. The importance of healthy food choices with portion control discussed. Encouraged to start a food diary, count calories and to consider  joining a support group. Sample diet sheets offered. Goals set by the patient for the next several months.    

## 2013-01-19 NOTE — Assessment & Plan Note (Signed)
Updated lab needed Patient educated about the importance of limiting  Carbohydrate intake , the need to commit to daily physical activity for a minimum of 30 minutes , and to commit weight loss. The fact that changes in all these areas will reduce or eliminate all together the development of diabetes is stressed.    

## 2013-01-19 NOTE — Assessment & Plan Note (Signed)
Controlled, no change in medication DASH diet and commitment to daily physical activity for a minimum of 30 minutes discussed and encouraged, as a part of hypertension management. The importance of attaining a healthy weight is also discussed.  

## 2013-01-20 ENCOUNTER — Other Ambulatory Visit: Payer: Self-pay

## 2013-01-20 MED ORDER — POTASSIUM CHLORIDE CRYS ER 20 MEQ PO TBCR: 20.0000 meq | EXTENDED_RELEASE_TABLET | Freq: Every day | ORAL | Status: DC

## 2013-01-21 ENCOUNTER — Ambulatory Visit (HOSPITAL_COMMUNITY)
Admission: RE | Admit: 2013-01-21 | Discharge: 2013-01-21 | Disposition: A | Discharge status: Home / Self Care | Payer: Medicare Other | Source: Ambulatory Visit | Attending: Family Medicine | Admitting: Family Medicine

## 2013-01-21 DIAGNOSIS — Z1231 Encounter for screening mammogram for malignant neoplasm of breast: Secondary | ICD-10-CM | POA: Insufficient documentation

## 2013-01-21 DIAGNOSIS — Z139 Encounter for screening, unspecified: Secondary | ICD-10-CM

## 2013-01-21 LAB — CBC
HCT: 41.8 % (ref 36.0–46.0)
Hemoglobin: 13.7 g/dL (ref 12.0–15.0)
MCV: 79.9 fL (ref 78.0–100.0)
Platelets: 306 10*3/uL (ref 150–400)
RBC: 5.23 MIL/uL — ABNORMAL HIGH (ref 3.87–5.11)
WBC: 5.9 10*3/uL (ref 4.0–10.5)

## 2013-01-21 LAB — COMPREHENSIVE METABOLIC PANEL
AST: 21 U/L (ref 0–37)
Alkaline Phosphatase: 96 U/L (ref 39–117)
BUN: 17 mg/dL (ref 6–23)
Creat: 0.85 mg/dL (ref 0.50–1.10)
Glucose, Bld: 81 mg/dL (ref 70–99)
Total Bilirubin: 0.6 mg/dL (ref 0.3–1.2)

## 2013-01-21 LAB — LIPID PANEL
Cholesterol: 188 mg/dL (ref 0–200)
HDL: 42 mg/dL (ref >39)
Total CHOL/HDL Ratio: 4.5 Ratio
VLDL: 12 mg/dL (ref 0–40)

## 2013-01-21 LAB — HEMOGLOBIN A1C: Mean Plasma Glucose: 126 mg/dL — ABNORMAL HIGH (ref <117)

## 2013-01-25 ENCOUNTER — Other Ambulatory Visit: Payer: Self-pay | Admitting: Family Medicine

## 2013-01-25 LAB — HEPATITIS PANEL, ACUTE
HCV Ab: NEGATIVE
Hep A IgM: NEGATIVE

## 2013-06-15 ENCOUNTER — Other Ambulatory Visit: Payer: Self-pay

## 2013-06-15 MED ORDER — HYDROCHLOROTHIAZIDE 12.5 MG PO CAPS: ORAL_CAPSULE | ORAL | Status: DC

## 2013-06-15 MED ORDER — AMLODIPINE BESYLATE 10 MG PO TABS: ORAL_TABLET | ORAL | Status: DC

## 2013-06-15 MED ORDER — METOPROLOL SUCCINATE ER 25 MG PO TB24: ORAL_TABLET | ORAL | Status: DC

## 2013-06-15 MED ORDER — POTASSIUM CHLORIDE CRYS ER 20 MEQ PO TBCR: 20.0000 meq | EXTENDED_RELEASE_TABLET | Freq: Every day | ORAL | Status: DC

## 2013-06-25 ENCOUNTER — Other Ambulatory Visit: Payer: Self-pay | Admitting: Family Medicine

## 2013-06-25 LAB — BASIC METABOLIC PANEL
Chloride: 107 mEq/L (ref 96–112)
Creat: 0.84 mg/dL (ref 0.50–1.10)
Potassium: 4 mEq/L (ref 3.5–5.3)
Sodium: 141 mEq/L (ref 135–145)

## 2013-06-25 LAB — HEMOGLOBIN A1C: Mean Plasma Glucose: 128 mg/dL — ABNORMAL HIGH (ref <117)

## 2013-07-06 ENCOUNTER — Encounter: Payer: Self-pay | Admitting: Family Medicine

## 2013-07-06 ENCOUNTER — Ambulatory Visit (INDEPENDENT_AMBULATORY_CARE_PROVIDER_SITE_OTHER): Payer: 59 | Admitting: Family Medicine

## 2013-07-06 VITALS — BP 158/80 | HR 60 | Resp 16 | Wt 216.1 lb

## 2013-07-06 DIAGNOSIS — Z1211 Encounter for screening for malignant neoplasm of colon: Secondary | ICD-10-CM

## 2013-07-06 DIAGNOSIS — E785 Hyperlipidemia, unspecified: Secondary | ICD-10-CM

## 2013-07-06 DIAGNOSIS — E669 Obesity, unspecified: Secondary | ICD-10-CM

## 2013-07-06 DIAGNOSIS — R7303 Prediabetes: Secondary | ICD-10-CM

## 2013-07-06 DIAGNOSIS — I10 Essential (primary) hypertension: Secondary | ICD-10-CM

## 2013-07-06 DIAGNOSIS — R7309 Other abnormal glucose: Secondary | ICD-10-CM

## 2013-07-06 LAB — POC HEMOCCULT BLD/STL (OFFICE/1-CARD/DIAGNOSTIC)

## 2013-07-06 MED ORDER — METOPROLOL SUCCINATE ER 25 MG PO TB24: ORAL_TABLET | ORAL | Status: DC

## 2013-07-06 MED ORDER — HYDROCHLOROTHIAZIDE 12.5 MG PO CAPS: ORAL_CAPSULE | ORAL | Status: DC

## 2013-07-06 MED ORDER — PRAVASTATIN SODIUM 10 MG PO TABS: 10.0000 mg | ORAL_TABLET | Freq: Every day | ORAL | Status: DC

## 2013-07-06 MED ORDER — POTASSIUM CHLORIDE CRYS ER 20 MEQ PO TBCR: 20.0000 meq | EXTENDED_RELEASE_TABLET | Freq: Every day | ORAL | Status: DC

## 2013-07-06 MED ORDER — AMLODIPINE BESYLATE 10 MG PO TABS: ORAL_TABLET | ORAL | Status: DC

## 2013-07-06 NOTE — Patient Instructions (Addendum)
F/u in 4  Month  Please call and come next week for flu vaccine, you need this!  Blood sugar is slightly higher, please continue to work on regular physical activity and low carb diet  Blood pressure is elevated today at the visit, pls ensure you ciommit to exercise and a diet low in salt and high in fresh vegetables and low carb fruits, fresh or frozen  Rectal exam is normal   90 day supplies with 1 refill is being sent in with all your mediocatio , including the pravachol to lower cholesterol  Fasting lipid, cmp, hBA1C in January before visit  PLEASE check your pharmacy for the shingles vaccine, you NEED this

## 2013-07-06 NOTE — Progress Notes (Signed)
  Subjective:    Patient ID: Tammy Galvan, female    DOB: 1932/12/24, 79 y.o.   MRN: [id number]  HPI The PT is here for follow up and re-evaluation of chronic medical conditions, medication management and review of any available recent lab and radiology data.  Preventive health is updated, specifically  Cancer screening and Immunization.   Questions or concerns regarding consultations or procedures which the PT has had in the interim are  addressed. The PT denies any adverse reactions to current medications since the last visit.  There are no new concerns.  There are no specific complaints       Review of Systems    See HPI Denies recent fever or chills. Denies sinus pressure, nasal congestion, ear pain or sore throat. Denies chest congestion, productive cough or wheezing. Denies chest pains, palpitations and leg swelling Denies abdominal pain, nausea, vomiting,diarrhea or constipation.   Denies dysuria, frequency, hesitancy or incontinence. Denies joint pain, swelling and limitation in mobility. Denies headaches, seizures, numbness, or tingling. Denies depression, anxiety or insomnia. Denies skin break down or rash.     Objective:   Physical Exam  Patient alert and oriented and in no cardiopulmonary distress.  HEENT: No facial asymmetry, EOMI, no sinus tenderness,  oropharynx pink and moist.  Neck supple no adenopathy.  Chest: Clear to auscultation bilaterally.  CVS: S1, S2 no murmurs, no S3.  ABD: Soft non tender. Bowel sounds normal. Rectal: no mass, heme negative stool Ext: No edema  MS: Adequate ROM spine, shoulders, hips and knees.  Skin: Intact, no ulcerations or rash noted.  Psych: Good eye contact, normal affect. Memory intact not anxious or depressed appearing.  CNS: CN 2-12 intact, power, tone and sensation normal throughout.       Assessment & Plan:

## 2013-07-15 ENCOUNTER — Ambulatory Visit (INDEPENDENT_AMBULATORY_CARE_PROVIDER_SITE_OTHER): Payer: Medicare Other

## 2013-07-15 DIAGNOSIS — Z23 Encounter for immunization: Secondary | ICD-10-CM

## 2013-07-26 NOTE — Assessment & Plan Note (Signed)
Elevated, no med change today, generally well controlled. I fstillelevated at next visit, dose adjustment will be made. Based on new guidelines based on ge this may also be appropriate for her. DASH diet and commitment to daily physical activity for a minimum of 30 minutes discussed and encouraged, as a part of hypertension management. The importance of attaining a healthy weight is also discussed.

## 2013-07-26 NOTE — Assessment & Plan Note (Signed)
Improved. Pt applauded on succesful weight loss through lifestyle change, and encouraged to continue same. Weight loss goal set for the next several months.  

## 2013-07-26 NOTE — Assessment & Plan Note (Signed)
deteriorated with elevation of HBa1C Patient educated about the importance of limiting  Carbohydrate intake , the need to commit to daily physical activity for a minimum of 30 minutes , and to commit weight loss. The fact that changes in all these areas will reduce or eliminate all together the development of diabetes is stressed.

## 2013-11-04 ENCOUNTER — Encounter (INDEPENDENT_AMBULATORY_CARE_PROVIDER_SITE_OTHER): Payer: Self-pay

## 2013-11-04 ENCOUNTER — Encounter: Payer: Self-pay | Admitting: Family Medicine

## 2013-11-04 ENCOUNTER — Ambulatory Visit (INDEPENDENT_AMBULATORY_CARE_PROVIDER_SITE_OTHER): Payer: 59 | Admitting: Family Medicine

## 2013-11-04 VITALS — BP 138/78 | HR 66 | Resp 16 | Ht 68.0 in | Wt 221.4 lb

## 2013-11-04 DIAGNOSIS — R7303 Prediabetes: Secondary | ICD-10-CM

## 2013-11-04 DIAGNOSIS — L089 Local infection of the skin and subcutaneous tissue, unspecified: Secondary | ICD-10-CM

## 2013-11-04 DIAGNOSIS — L723 Sebaceous cyst: Secondary | ICD-10-CM

## 2013-11-04 DIAGNOSIS — IMO0002 Reserved for concepts with insufficient information to code with codable children: Secondary | ICD-10-CM

## 2013-11-04 DIAGNOSIS — I10 Essential (primary) hypertension: Secondary | ICD-10-CM

## 2013-11-04 DIAGNOSIS — M170 Bilateral primary osteoarthritis of knee: Secondary | ICD-10-CM

## 2013-11-04 DIAGNOSIS — E669 Obesity, unspecified: Secondary | ICD-10-CM

## 2013-11-04 DIAGNOSIS — R7309 Other abnormal glucose: Secondary | ICD-10-CM

## 2013-11-04 DIAGNOSIS — M549 Dorsalgia, unspecified: Secondary | ICD-10-CM

## 2013-11-04 DIAGNOSIS — M1710 Unilateral primary osteoarthritis, unspecified knee: Secondary | ICD-10-CM

## 2013-11-04 DIAGNOSIS — E785 Hyperlipidemia, unspecified: Secondary | ICD-10-CM

## 2013-11-04 LAB — COMPREHENSIVE METABOLIC PANEL
ALT: 15 U/L (ref 0–35)
AST: 15 U/L (ref 0–37)
Albumin: 3.6 g/dL (ref 3.5–5.2)
Alkaline Phosphatase: 70 U/L (ref 39–117)
BUN: 13 mg/dL (ref 6–23)
CO2: 29 mEq/L (ref 19–32)
Calcium: 9.2 mg/dL (ref 8.4–10.5)
Chloride: 105 mEq/L (ref 96–112)
Creat: 0.81 mg/dL (ref 0.50–1.10)
Glucose, Bld: 77 mg/dL (ref 70–99)
Potassium: 4.2 mEq/L (ref 3.5–5.3)
Sodium: 141 mEq/L (ref 135–145)
Total Bilirubin: 0.4 mg/dL (ref 0.3–1.2)
Total Protein: 6.3 g/dL (ref 6.0–8.3)

## 2013-11-04 LAB — LIPID PANEL
Cholesterol: 151 mg/dL (ref 0–200)
HDL: 50 mg/dL (ref >39)
LDL Cholesterol: 91 mg/dL (ref 0–99)
Total CHOL/HDL Ratio: 3 Ratio
Triglycerides: 49 mg/dL (ref <150)
VLDL: 10 mg/dL (ref 0–40)

## 2013-11-04 LAB — HEMOGLOBIN A1C
Hgb A1c MFr Bld: 6 % — ABNORMAL HIGH (ref <5.7)
Mean Plasma Glucose: 126 mg/dL — ABNORMAL HIGH (ref <117)

## 2013-11-04 MED ORDER — DOXYCYCLINE HYCLATE 100 MG PO CAPS
100.0000 mg | ORAL_CAPSULE | Freq: Two times a day (BID) | ORAL | Status: AC
Start: 2013-11-04 — End: 2013-11-13

## 2013-11-04 MED ORDER — CEFTRIAXONE SODIUM 500 MG IJ SOLR
500.0000 mg | Freq: Once | INTRAMUSCULAR | Status: AC
  Administered 2013-11-04: 500 mg via INTRAMUSCULAR

## 2013-11-04 NOTE — Progress Notes (Signed)
   Subjective:    Patient ID: Tammy Galvan, female    DOB: [birth year], 80 y.o.   MRN: [id number]  HPI Pt in with 2 week h/o infected cyst on left upper back which got progressively larger, she burst it and it has been draining pus. She denies fever or chills and any constitutional symptoms The area is again getting larger and painful, she has had a sebaceous cyst in this same place with a similar flare in the past. Pt is also c/o increased bilateral knee pain in the past 2 to 3 weeks, at times there is instability in the left , which remains swollen 2 week h/o left inner thigh and left leg pain with numbness and a cold feeling, at times up to a 10 , interfering with her sleep.She denies incontinence of stool  Or urine, she denies lower extremity weakness or numbness, she denies incontinence of stool or urine. Requests handicap sticker due to mobility issues     Review of Systems See HPI Denies recent fever or chills. Denies sinus pressure, nasal congestion, ear pain or sore throat. Denies chest congestion, productive cough or wheezing. Denies chest pains, palpitations and leg swelling Denies abdominal pain, nausea, vomiting,diarrhea or constipation.   Denies dysuria, frequency, hesitancy or incontinence.  Denies headaches, seizures, numbness, or tingling. Denies depression, anxiety or insomnia.        Objective:   Physical Exam Patient alert and oriented and in no cardiopulmonary distress.  HEENT: No facial asymmetry, EOMI, no sinus tenderness,  oropharynx pink and moist.  Neck supple no adenopathy.  Chest: Clear to auscultation bilaterally.  CVS: S1, S2 no murmurs, no S3.  ABD: Soft non tender. Bowel sounds normal.  Ext: No edema  MS: decreased  ROM lumbar  spine, and knees.Tender and swollen left knee with deformity.  Skin: Infected sebaceous cyst which is open and draining purulent material.Erythema of surrounding skin. Width approx 10cm and depth of lesion approx  6cm  Psych: Good eye contact, normal affect. Memory intact not anxious or depressed appearing.  CNS: CN 2-12 intact, power, tone and sensation normal throughout.        Assessment & Plan:

## 2013-11-04 NOTE — Patient Instructions (Addendum)
Annual wellness in early June, call if you need me before please  Return for nurse visit in 2 weeks for injections for back and knee pain, at that time you will also get prescriptions to fill for pain medication for 1 week  Lab today, you already have that sheet  Rocephin in the office and 10 day course of antibiotic prescribed for the infected cyst.  Please keep the area clean and cover loosely, let it drain on its own, you are also referred to Dr Arnoldo Morale for definitive management  CBC, fasting chem 7 , hBA1C, lipid and TSH in June befiore visit

## 2013-11-07 DIAGNOSIS — M170 Bilateral primary osteoarthritis of knee: Secondary | ICD-10-CM | POA: Insufficient documentation

## 2013-11-07 NOTE — Assessment & Plan Note (Signed)
Controlled, no change in medication DASH diet and commitment to daily physical activity for a minimum of 30 minutes discussed and encouraged, as a part of hypertension management. The importance of attaining a healthy weight is also discussed.  

## 2013-11-07 NOTE — Assessment & Plan Note (Signed)
Unchanged. Patient re-educated about  the importance of commitment to a  minimum of 150 minutes of exercise per week. The importance of healthy food choices with portion control discussed. Encouraged to start a food diary, count calories and to consider  joining a support group. Sample diet sheets offered. Goals set by the patient for the next several months.    

## 2013-11-07 NOTE — Assessment & Plan Note (Signed)
Deep infected sebaceous cyst which is recurrent, Abiotic in office and prescribed

## 2013-11-07 NOTE — Assessment & Plan Note (Addendum)
Increased and uncontrolled x 1 week, with left thigh affected, return ofr ant inflammatory shots and meds after definitive treatment of cyst

## 2013-11-07 NOTE — Assessment & Plan Note (Signed)
Slight improvement Patient educated about the importance of limiting  Carbohydrate intake , the need to commit to daily physical activity for a minimum of 30 minutes , and to commit weight loss. The fact that changes in all these areas will reduce or eliminate all together the development of diabetes is stressed.

## 2013-11-07 NOTE — Assessment & Plan Note (Signed)
Flare with bilateral knee pain, definitive management in 2 weeks

## 2013-11-08 ENCOUNTER — Ambulatory Visit: Payer: 59 | Admitting: Family Medicine

## 2013-11-18 ENCOUNTER — Ambulatory Visit (INDEPENDENT_AMBULATORY_CARE_PROVIDER_SITE_OTHER): Payer: 59

## 2013-11-18 VITALS — BP 138/80 | Wt 219.0 lb

## 2013-11-18 DIAGNOSIS — M549 Dorsalgia, unspecified: Secondary | ICD-10-CM

## 2013-11-18 MED ORDER — METHYLPREDNISOLONE ACETATE 80 MG/ML IJ SUSP
80.0000 mg | Freq: Once | INTRAMUSCULAR | Status: AC
  Administered 2013-11-18: 80 mg via INTRAMUSCULAR

## 2013-11-18 MED ORDER — PREDNISONE (PAK) 5 MG PO TABS: 5.0000 mg | ORAL_TABLET | ORAL | Status: DC

## 2013-11-18 MED ORDER — KETOROLAC TROMETHAMINE 60 MG/2ML IM SOLN
60.0000 mg | Freq: Once | INTRAMUSCULAR | Status: AC
  Administered 2013-11-18: 60 mg via INTRAMUSCULAR

## 2013-11-18 MED ORDER — IBUPROFEN 800 MG PO TABS: 800.0000 mg | ORAL_TABLET | Freq: Three times a day (TID) | ORAL | Status: DC | PRN

## 2013-11-18 NOTE — Progress Notes (Signed)
Patient received injections of toradol 60mg  and depo 80mg  per Dr. Deborha Payment were postponed from her last visit due to an infected cyst that needed treatment first. Given with no complications. Rx's sent in for prednisone and Ibuprofen

## 2013-11-23 ENCOUNTER — Ambulatory Visit: Payer: 59 | Admitting: Family Medicine

## 2014-03-04 ENCOUNTER — Other Ambulatory Visit: Payer: Self-pay | Admitting: Family Medicine

## 2014-03-09 ENCOUNTER — Other Ambulatory Visit: Payer: Self-pay | Admitting: Family Medicine

## 2014-03-09 LAB — CBC WITH DIFFERENTIAL/PLATELET
BASOS ABS: 0 10*3/uL (ref 0.0–0.1)
Basophils Relative: 1 % (ref 0–1)
Eosinophils Absolute: 0.6 10*3/uL (ref 0.0–0.7)
Eosinophils Relative: 10 % — ABNORMAL HIGH (ref 0–5)
HEMATOCRIT: 41.1 % (ref 36.0–46.0)
Hemoglobin: 13.5 g/dL (ref 12.0–15.0)
LYMPHS PCT: 34 % (ref 12–46)
Lymphs Abs: 2.2 10*3/uL (ref 0.7–4.0)
MCH: 26.6 pg (ref 26.0–34.0)
MCHC: 32.8 g/dL (ref 30.0–36.0)
MCV: 81.1 fL (ref 78.0–100.0)
MONO ABS: 0.6 10*3/uL (ref 0.1–1.0)
Monocytes Relative: 9 % (ref 3–12)
NEUTROS ABS: 3 10*3/uL (ref 1.7–7.7)
NEUTROS PCT: 46 % (ref 43–77)
Platelets: 298 10*3/uL (ref 150–400)
RBC: 5.07 MIL/uL (ref 3.87–5.11)
RDW: 15.2 % (ref 11.5–15.5)
WBC: 6.4 10*3/uL (ref 4.0–10.5)

## 2014-03-09 LAB — LIPID PANEL
Cholesterol: 142 mg/dL (ref 0–200)
HDL: 51 mg/dL (ref >39)
LDL CALC: 80 mg/dL (ref 0–99)
Total CHOL/HDL Ratio: 2.8 Ratio
Triglycerides: 53 mg/dL (ref <150)
VLDL: 11 mg/dL (ref 0–40)

## 2014-03-09 LAB — HEMOGLOBIN A1C
HEMOGLOBIN A1C: 5.8 % — AB (ref <5.7)
Mean Plasma Glucose: 120 mg/dL — ABNORMAL HIGH (ref <117)

## 2014-03-09 LAB — BASIC METABOLIC PANEL
BUN: 10 mg/dL (ref 6–23)
CALCIUM: 8.9 mg/dL (ref 8.4–10.5)
CO2: 26 mEq/L (ref 19–32)
CREATININE: 0.79 mg/dL (ref 0.50–1.10)
Chloride: 108 mEq/L (ref 96–112)
GLUCOSE: 90 mg/dL (ref 70–99)
Potassium: 3.7 mEq/L (ref 3.5–5.3)
SODIUM: 142 meq/L (ref 135–145)

## 2014-03-09 LAB — TSH: TSH: 4.787 u[IU]/mL — ABNORMAL HIGH (ref 0.350–4.500)

## 2014-03-11 ENCOUNTER — Ambulatory Visit (INDEPENDENT_AMBULATORY_CARE_PROVIDER_SITE_OTHER): Payer: 59 | Admitting: Family Medicine

## 2014-03-11 ENCOUNTER — Encounter (INDEPENDENT_AMBULATORY_CARE_PROVIDER_SITE_OTHER): Payer: Self-pay

## 2014-03-11 ENCOUNTER — Encounter: Payer: Self-pay | Admitting: Family Medicine

## 2014-03-11 VITALS — BP 140/78 | HR 61 | Resp 16 | Wt 224.1 lb

## 2014-03-11 DIAGNOSIS — Z13820 Encounter for screening for osteoporosis: Secondary | ICD-10-CM

## 2014-03-11 DIAGNOSIS — E669 Obesity, unspecified: Secondary | ICD-10-CM

## 2014-03-11 DIAGNOSIS — I10 Essential (primary) hypertension: Secondary | ICD-10-CM

## 2014-03-11 DIAGNOSIS — Z1231 Encounter for screening mammogram for malignant neoplasm of breast: Secondary | ICD-10-CM

## 2014-03-11 DIAGNOSIS — E785 Hyperlipidemia, unspecified: Secondary | ICD-10-CM

## 2014-03-11 DIAGNOSIS — R7303 Prediabetes: Secondary | ICD-10-CM

## 2014-03-11 DIAGNOSIS — R7309 Other abnormal glucose: Secondary | ICD-10-CM

## 2014-03-11 LAB — T4, FREE: FREE T4: 0.97 ng/dL (ref 0.80–1.80)

## 2014-03-11 LAB — T3, FREE: T3 FREE: 3 pg/mL (ref 2.3–4.2)

## 2014-03-11 MED ORDER — AMLODIPINE BESYLATE 10 MG PO TABS: ORAL_TABLET | ORAL | Status: DC

## 2014-03-11 MED ORDER — METOPROLOL SUCCINATE ER 25 MG PO TB24: ORAL_TABLET | ORAL | Status: DC

## 2014-03-11 MED ORDER — HYDROCHLOROTHIAZIDE 25 MG PO TABS: 25.0000 mg | ORAL_TABLET | Freq: Every day | ORAL | Status: DC

## 2014-03-11 NOTE — Patient Instructions (Signed)
Annual physical exam in end August  call if you need me before   HBA1C in 4 month, and cmp in 4 month, non fast  Increase in hCTZ to 25 mg daily, since bP is high  We will sched mammogram morning as preferred, also you are referred for bone density  We will add thyroid hormone levels and contact you with results  Leg swelling is due to excessive heat and keeping legs down, PLS  Use support hose if you know you will have legs down for a long time  There is cause for concern if swollen legs remain swollen overnight .  Your kidney function is normal, and you have no heart failure symptoms or signs

## 2014-03-12 NOTE — Progress Notes (Signed)
   Subjective:    Patient ID: Tammy Galvan, female    DOB: [birth year], 80 y.o.   MRN: [id number]  HPI The PT is here for follow up and re-evaluation of chronic medical conditions, medication management and review of any available recent lab and radiology data.  Preventive health is updated, specifically  Cancer screening and Immunization.    The PT denies any adverse reactions to current medications since the last visit.  C/o increased leg swelling noted in past several weeks, resolves overnight, denies any specific heart failure symptoms when questioned. No commitment to regular exercise and still "loves to eat" , resultant weight gain, has however reduced sweet intake with improved HBA1C which is good      Review of Systems See HPI Denies recent fever or chills. Denies sinus pressure, nasal congestion, ear pain or sore throat. Denies chest congestion, productive cough or wheezing. Denies chest pains, palpitations , pND or orthopnea Denies abdominal pain, nausea, vomiting,diarrhea or constipation.   Denies dysuria, frequency, hesitancy or incontinence. Denies joint pain, swelling and limitation in mobility. Denies headaches, seizures, numbness, or tingling. Denies depression, anxiety or insomnia. Denies skin break down or rash.    \    Objective:   Physical Exam BP 140/78  Pulse 61  Resp 16  Wt 224 lb 1.9 oz (101.66 kg)  SpO2 97% Patient alert and oriented and in no cardiopulmonary distress.  HEENT: No facial asymmetry, EOMI, no sinus tenderness,  oropharynx pink and moist.  Neck supple no adenopathy.  Chest: Clear to auscultation bilaterally.  CVS: S1, S2 no murmurs, no S3.  ABD: Soft non tender. Bowel sounds normal.  Ext: No edema  MS: Adequate ROM spine, shoulders, hips and knees.  Skin: Intact, no ulcerations or rash noted.  Psych: Good eye contact, normal affect. Memory intact not anxious or depressed appearing.  CNS: CN 2-12 intact, power, tone and  sensation normal throughout.        Assessment & Plan:  HYPERTENSION Uncontrolled, inc dose of HCTZ  DASH diet and commitment to daily physical activity for a minimum of 30 minutes discussed and encouraged, as a part of hypertension management. The importance of attaining a healthy weight is also discussed.   Prediabetes Improved Patient educated about the importance of limiting  Carbohydrate intake , the need to commit to daily physical activity for a minimum of 30 minutes , and to commit weight loss. The fact that changes in all these areas will reduce or eliminate all together the development of diabetes is stressed.     OBESITY, UNSPECIFIED Deteriorated. Patient re-educated about  the importance of commitment to a  minimum of 150 minutes of exercise per week. The importance of healthy food choices with portion control discussed. Encouraged to start a food diary, count calories and to consider  joining a support group. Sample diet sheets offered. Goals set by the patient for the next several months.     Other and unspecified hyperlipidemia Controlled, no change in medication Hyperlipidemia:Low fat diet discussed and encouraged.

## 2014-03-12 NOTE — Assessment & Plan Note (Signed)
Deteriorated. Patient re-educated about  the importance of commitment to a  minimum of 150 minutes of exercise per week. The importance of healthy food choices with portion control discussed. Encouraged to start a food diary, count calories and to consider  joining a support group. Sample diet sheets offered. Goals set by the patient for the next several months.    

## 2014-03-12 NOTE — Assessment & Plan Note (Signed)
Uncontrolled, inc dose of HCTZ  DASH diet and commitment to daily physical activity for a minimum of 30 minutes discussed and encouraged, as a part of hypertension management. The importance of attaining a healthy weight is also discussed.

## 2014-03-12 NOTE — Assessment & Plan Note (Signed)
Controlled, no change in medication Hyperlipidemia:Low fat diet discussed and encouraged.  \ 

## 2014-03-12 NOTE — Assessment & Plan Note (Signed)
Improved Patient educated about the importance of limiting  Carbohydrate intake , the need to commit to daily physical activity for a minimum of 30 minutes , and to commit weight loss. The fact that changes in all these areas will reduce or eliminate all together the development of diabetes is stressed.

## 2014-03-18 ENCOUNTER — Ambulatory Visit (HOSPITAL_COMMUNITY)
Admission: RE | Admit: 2014-03-18 | Discharge: 2014-03-18 | Disposition: A | Discharge status: Home / Self Care | Payer: Medicare Other | Source: Ambulatory Visit | Attending: Family Medicine | Admitting: Family Medicine

## 2014-03-18 DIAGNOSIS — Z1231 Encounter for screening mammogram for malignant neoplasm of breast: Secondary | ICD-10-CM | POA: Insufficient documentation

## 2014-03-18 DIAGNOSIS — Z13820 Encounter for screening for osteoporosis: Secondary | ICD-10-CM | POA: Insufficient documentation

## 2014-03-28 ENCOUNTER — Encounter: Payer: 59 | Admitting: Family Medicine

## 2014-06-06 ENCOUNTER — Other Ambulatory Visit: Payer: Self-pay | Admitting: Family Medicine

## 2014-06-09 LAB — COMPREHENSIVE METABOLIC PANEL
ALBUMIN: 3.7 g/dL (ref 3.5–5.2)
ALT: 15 U/L (ref 0–35)
AST: 14 U/L (ref 0–37)
Alkaline Phosphatase: 60 U/L (ref 39–117)
BUN: 11 mg/dL (ref 6–23)
CO2: 26 meq/L (ref 19–32)
Calcium: 9 mg/dL (ref 8.4–10.5)
Chloride: 106 mEq/L (ref 96–112)
Creat: 0.8 mg/dL (ref 0.50–1.10)
GLUCOSE: 99 mg/dL (ref 70–99)
POTASSIUM: 4.2 meq/L (ref 3.5–5.3)
SODIUM: 140 meq/L (ref 135–145)
TOTAL PROTEIN: 6.1 g/dL (ref 6.0–8.3)
Total Bilirubin: 0.5 mg/dL (ref 0.2–1.2)

## 2014-06-09 LAB — HEMOGLOBIN A1C
Hgb A1c MFr Bld: 6.2 % — ABNORMAL HIGH (ref <5.7)
Mean Plasma Glucose: 131 mg/dL — ABNORMAL HIGH (ref <117)

## 2014-06-14 ENCOUNTER — Encounter: Payer: Self-pay | Admitting: Family Medicine

## 2014-06-14 ENCOUNTER — Other Ambulatory Visit (HOSPITAL_COMMUNITY)
Admission: RE | Admit: 2014-06-14 | Discharge: 2014-06-14 | Disposition: A | Discharge status: Home / Self Care | Payer: Medicare Other | Source: Ambulatory Visit | Attending: Family Medicine | Admitting: Family Medicine

## 2014-06-14 ENCOUNTER — Encounter (INDEPENDENT_AMBULATORY_CARE_PROVIDER_SITE_OTHER): Payer: Self-pay

## 2014-06-14 ENCOUNTER — Ambulatory Visit (INDEPENDENT_AMBULATORY_CARE_PROVIDER_SITE_OTHER): Payer: Medicare Other | Admitting: Family Medicine

## 2014-06-14 VITALS — BP 120/72 | HR 65 | Resp 16 | Ht 68.0 in | Wt 224.4 lb

## 2014-06-14 DIAGNOSIS — R7303 Prediabetes: Secondary | ICD-10-CM

## 2014-06-14 DIAGNOSIS — Z23 Encounter for immunization: Secondary | ICD-10-CM

## 2014-06-14 DIAGNOSIS — E785 Hyperlipidemia, unspecified: Secondary | ICD-10-CM

## 2014-06-14 DIAGNOSIS — Z1272 Encounter for screening for malignant neoplasm of vagina: Secondary | ICD-10-CM

## 2014-06-14 DIAGNOSIS — Z124 Encounter for screening for malignant neoplasm of cervix: Secondary | ICD-10-CM | POA: Insufficient documentation

## 2014-06-14 DIAGNOSIS — Z0000 Encounter for general adult medical examination without abnormal findings: Secondary | ICD-10-CM | POA: Insufficient documentation

## 2014-06-14 DIAGNOSIS — I10 Essential (primary) hypertension: Secondary | ICD-10-CM

## 2014-06-14 DIAGNOSIS — Z1211 Encounter for screening for malignant neoplasm of colon: Secondary | ICD-10-CM

## 2014-06-14 LAB — POC HEMOCCULT BLD/STL (OFFICE/1-CARD/DIAGNOSTIC): Fecal Occult Blood, POC: NEGATIVE

## 2014-06-14 NOTE — Patient Instructions (Addendum)
F/u in 4 month, prevnar at that visit. call if you need me before Flu vaccine today  Pls work on changing food choices, to lower sweet and carb intake, also smaller portion sizes, blood sugar has increased  Pls commit to 30 minutes of physical activity every day, this will help blood sugar  Fasting lipid, cmp, hBA1C  In 4 month

## 2014-06-20 LAB — CYTOLOGY - PAP

## 2014-06-27 DIAGNOSIS — Z23 Encounter for immunization: Secondary | ICD-10-CM | POA: Insufficient documentation

## 2014-06-27 DIAGNOSIS — Z0000 Encounter for general adult medical examination without abnormal findings: Secondary | ICD-10-CM | POA: Insufficient documentation

## 2014-06-27 NOTE — Assessment & Plan Note (Signed)
Annual exam as documented. Counseling done  re healthy lifestyle involving commitment to 150 minutes exercise per week, heart healthy diet, and attaining healthy weight.The importance of adequate sleep also discussed. Regular seat belt use , is also discussed. Changes in health habits are decided on by the patient with goals and time frames  set for achieving them. Immunization and cancer screening needs are specifically addressed at this visit.

## 2014-06-27 NOTE — Assessment & Plan Note (Signed)
Vaccine administered at visit.  

## 2014-06-27 NOTE — Progress Notes (Signed)
   Subjective:    Patient ID: Tammy Galvan, female    DOB: 1933/04/11, 80 y.o.   MRN: [id number]  HPI Patient is in for annual physical  exam. Flu vaccine is administered   Review of Systems See HPI     Objective:   Physical Exam BP 120/72  Pulse 65  Resp 16  Ht 5\' 8"  (1.727 m)  Wt 224 lb 6.4 oz (101.787 kg)  BMI 34.13 kg/m2  SpO2 98%  Pleasant well nourished female, alert and oriented x 3, in no cardio-pulmonary distress. Afebrile. HEENT No facial trauma or asymetry. Sinuses non tender.  EOMI, PERTL, fundoscopic exam  no hemorhage or exudate.  External ears normal, tympanic membranes clear. Oropharynx moist, no exudate,fairly  good dentition. Neck: supple, no adenopathy,JVD or thyromegaly.No bruits.  Chest: Clear to ascultation bilaterally.No crackles or wheezes. Non tender to palpation  Breast: No asymetry,no masses or lumps. No tenderness. No nipple discharge or inversion. No axillary or supraclavicular adenopathy  Cardiovascular system; Heart sounds normal,  S1 and  S2 ,no S3.  No murmur, or thrill. Apical beat not displaced Peripheral pulses normal.  Abdomen: Soft, non tender, no organomegaly or masses. No bruits. Bowel sounds normal. No guarding, tenderness or rebound.  Rectal:  Normal sphincter tone. No mass.No rectal masses.  Guaiac negative stool.  GU: External genitalia normal female genitalia , female distribution of hair. No lesions. Urethral meatus normal in size, no  Prolapse, no lesions visibly  Present. Bladder non tender. Vagina pink and moist , with no visible lesions , discharge present . Adequate pelvic support no  cystocele or rectocele noted  Uterus absent, no adnexal masses, no adnexal tenderness.   Musculoskeletal exam: Adequate though reduced ROM of spine, hips , shoulders and knees. No deformity ,swelling or crepitus noted. No muscle wasting or atrophy.   Neurologic: Cranial nerves 2 to 12 intact. Power, tone  ,sensation and reflexes normal throughout. No disturbance in gait. No tremor.  Skin: Intact, no ulceration, erythema , scaling or rash noted. Pigmentation normal throughout  Psych; Normal mood and affect. Judgement and concentration normal        Assessment & Plan:  Encounter for annual physical exam Annual exam as documented. Counseling done  re healthy lifestyle involving commitment to 150 minutes exercise per week, heart healthy diet, and attaining healthy weight.The importance of adequate sleep also discussed. Regular seat belt use , is also discussed. Changes in health habits are decided on by the patient with goals and time frames  set for achieving them. Immunization and cancer screening needs are specifically addressed at this visit.   Need for prophylactic vaccination and inoculation against influenza Vaccine administered at visit.

## 2014-09-04 ENCOUNTER — Other Ambulatory Visit: Payer: Self-pay | Admitting: Family Medicine

## 2014-12-03 ENCOUNTER — Other Ambulatory Visit: Payer: Self-pay | Admitting: Family Medicine

## 2015-01-11 ENCOUNTER — Other Ambulatory Visit: Payer: Self-pay | Admitting: Family Medicine

## 2015-01-11 LAB — COMPREHENSIVE METABOLIC PANEL
ALT: 15 U/L (ref 0–35)
AST: 16 U/L (ref 0–37)
Albumin: 3.7 g/dL (ref 3.5–5.2)
Alkaline Phosphatase: 68 U/L (ref 39–117)
BUN: 14 mg/dL (ref 6–23)
CALCIUM: 9.1 mg/dL (ref 8.4–10.5)
CHLORIDE: 104 meq/L (ref 96–112)
CO2: 27 mEq/L (ref 19–32)
CREATININE: 0.79 mg/dL (ref 0.50–1.10)
Glucose, Bld: 95 mg/dL (ref 70–99)
Potassium: 3.9 mEq/L (ref 3.5–5.3)
SODIUM: 139 meq/L (ref 135–145)
Total Bilirubin: 0.6 mg/dL (ref 0.2–1.2)
Total Protein: 6.2 g/dL (ref 6.0–8.3)

## 2015-01-11 LAB — LIPID PANEL
Cholesterol: 178 mg/dL (ref 0–200)
HDL: 50 mg/dL (ref >=46)
LDL CALC: 116 mg/dL — AB (ref 0–99)
TRIGLYCERIDES: 60 mg/dL (ref <150)
Total CHOL/HDL Ratio: 3.6 Ratio
VLDL: 12 mg/dL (ref 0–40)

## 2015-01-12 LAB — HEMOGLOBIN A1C
Hgb A1c MFr Bld: 6 % — ABNORMAL HIGH (ref <5.7)
Mean Plasma Glucose: 126 mg/dL — ABNORMAL HIGH (ref <117)

## 2015-01-18 ENCOUNTER — Encounter: Payer: Self-pay | Admitting: Family Medicine

## 2015-01-18 ENCOUNTER — Ambulatory Visit (INDEPENDENT_AMBULATORY_CARE_PROVIDER_SITE_OTHER): Payer: Medicare Other | Admitting: Family Medicine

## 2015-01-18 VITALS — BP 130/80 | HR 65 | Resp 16 | Ht 68.0 in | Wt 224.0 lb

## 2015-01-18 DIAGNOSIS — E785 Hyperlipidemia, unspecified: Secondary | ICD-10-CM | POA: Diagnosis not present

## 2015-01-18 DIAGNOSIS — I10 Essential (primary) hypertension: Secondary | ICD-10-CM

## 2015-01-18 DIAGNOSIS — R7303 Prediabetes: Secondary | ICD-10-CM

## 2015-01-18 DIAGNOSIS — E669 Obesity, unspecified: Secondary | ICD-10-CM

## 2015-01-18 DIAGNOSIS — R7309 Other abnormal glucose: Secondary | ICD-10-CM

## 2015-01-18 MED ORDER — POTASSIUM CHLORIDE CRYS ER 20 MEQ PO TBCR: EXTENDED_RELEASE_TABLET | ORAL | Status: DC

## 2015-01-18 MED ORDER — PRAVASTATIN SODIUM 10 MG PO TABS: 10.0000 mg | ORAL_TABLET | Freq: Every day | ORAL | Status: DC

## 2015-01-18 NOTE — Assessment & Plan Note (Addendum)
Improved, HBA1C is 6.0 in 03/20156 Patient educated about the importance of limiting  Carbohydrate intake , the need to commit to daily physical activity for a minimum of 30 minutes , and to commit weight loss. The fact that changes in all these areas will reduce or eliminate all together the development of diabetes is stressed.   Diabetic Labs Latest Ref Rng 06/09/2014 03/09/2014 11/04/2013 06/25/2013 01/19/2013  HbA1c <5.7 % 6.2(H) 5.8(H) 6.0(H) 6.1(H) 6.0(H)  Chol 0 - 200 mg/dL - 142 151 - 188  HDL >39 mg/dL - 51 50 - 42  Calc LDL 0 - 99 mg/dL - 80 91 - 134(H)  Triglycerides <150 mg/dL - 53 49 - 60  Creatinine 0.50 - 1.10 mg/dL 0.80 0.79 0.81 0.84 0.85   BP/Weight 01/18/2015 06/14/2014 03/11/2014 11/18/2013 11/04/2013 12/28/4074 8/0/8811  Systolic BP 031 594 585 929 244 628 638  Diastolic BP 80 72 78 80 78 80 72  Wt. (Lbs) 224 224.4 224.12 219 221.4 216.12 224.04  BMI 34.07 34.13 34.09 33.31 33.67 32.87 34.07   No flowsheet data found.

## 2015-01-18 NOTE — Patient Instructions (Signed)
Annual wellness in 4 months, call if you need me before  Need to start pravastain 10 mg every day with your other meds for cholesterol  Blood sugar has improved, keep it up  It is important that you exercise regularly at least 30 minutes 5 times a week. If you develop chest pain, have severe difficulty breathing, or feel very tired, stop exercising immediately and seek medical attention    A healthy diet is rich in fruit, vegetables and whole grains. Poultry fish, nuts and beans are a healthy choice for protein rather then red meat. A low sodium diet and drinking 64 ounces of water daily is generally recommended. Oils and sweet should be limited. Carbohydrates especially for those who are diabetic or overweight, should be limited to 60-45 gram per meal. It is important to eat on a regular schedule, at least 3 times daily. Snacks should be primarily fruits, vegetables or nuts.  CBC, fasting lipid, cmp and EGFr, TSH, vit D in 4 month  Thanks for choosing [facility], we consider it a privelige to serve you.

## 2015-01-18 NOTE — Assessment & Plan Note (Signed)
uncontrolled Needs to resume pravachol  Hyperlipidemia:Low fat diet discussed and encouraged.  CMP Latest Ref Rng 06/09/2014 03/09/2014 11/04/2013  Glucose 70 - 99 mg/dL 99 90 77  BUN 6 - 23 mg/dL 11 10 13   Creatinine 0.50 - 1.10 mg/dL 0.80 0.79 0.81  Sodium 135 - 145 mEq/L 140 142 141  Potassium 3.5 - 5.3 mEq/L 4.2 3.7 4.2  Chloride 96 - 112 mEq/L 106 108 105  CO2 19 - 32 mEq/L 26 26 29   Calcium 8.4 - 10.5 mg/dL 9.0 8.9 9.2  Total Protein 6.0 - 8.3 g/dL 6.1 - 6.3  Total Bilirubin 0.2 - 1.2 mg/dL 0.5 - 0.4  Alkaline Phos 39 - 117 U/L 60 - 70  AST 0 - 37 U/L 14 - 15  ALT 0 - 35 U/L 15 - 15    Lipid Panel     Component Value Date/Time   CHOL 142 03/09/2014 0920   TRIG 53 03/09/2014 0920   HDL 51 03/09/2014 0920   CHOLHDL 2.8 03/09/2014 0920   VLDL 11 03/09/2014 0920   LDLCALC 80 03/09/2014 0920

## 2015-01-21 ENCOUNTER — Encounter: Payer: Self-pay | Admitting: Family Medicine

## 2015-01-21 NOTE — Assessment & Plan Note (Signed)
UnchangedPatient re-educated about  the importance of commitment to a  minimum of 150 minutes of exercise per week.  The importance of healthy food choices with portion control discussed. Encouraged to start a food diary, count calories and to consider  joining a support group. Sample diet sheets offered. Goals set by the patient for the next several months.   Wt Readings from Last 3 Encounters:  01/18/15 224 lb (101.606 kg)  06/14/14 224 lb 6.4 oz (101.787 kg)  03/11/14 224 lb 1.9 oz (101.66 kg)    Body mass index is 34.07 kg/(m^2).  Current exercise per week 50 minutes.

## 2015-01-21 NOTE — Assessment & Plan Note (Signed)
Controlled, no change in medication DASH diet and commitment to daily physical activity for a minimum of 30 minutes discussed and encouraged, as a part of hypertension management. The importance of attaining a healthy weight is also discussed.  BP/Weight 01/18/2015 06/14/2014 03/11/2014 11/18/2013 11/04/2013 4/66/5993 02/25/176  Systolic BP 939 030 092 330 076 226 333  Diastolic BP 80 72 78 80 78 80 72  Wt. (Lbs) 224 224.4 224.12 219 221.4 216.12 224.04  BMI 34.07 34.13 34.09 33.31 33.67 32.87 34.07    CMP Latest Ref Rng 06/09/2014 03/09/2014 11/04/2013  Glucose 70 - 99 mg/dL 99 90 77  BUN 6 - 23 mg/dL 11 10 13   Creatinine 0.50 - 1.10 mg/dL 0.80 0.79 0.81  Sodium 135 - 145 mEq/L 140 142 141  Potassium 3.5 - 5.3 mEq/L 4.2 3.7 4.2  Chloride 96 - 112 mEq/L 106 108 105  CO2 19 - 32 mEq/L 26 26 29   Calcium 8.4 - 10.5 mg/dL 9.0 8.9 9.2  Total Protein 6.0 - 8.3 g/dL 6.1 - 6.3  Total Bilirubin 0.2 - 1.2 mg/dL 0.5 - 0.4  Alkaline Phos 39 - 117 U/L 60 - 70  AST 0 - 37 U/L 14 - 15  ALT 0 - 35 U/L 15 - 15

## 2015-01-21 NOTE — Progress Notes (Signed)
Tammy Galvan     MRN: [id number]      DOB: 09-Mar-1933   HPI Tammy Galvan is here for follow up and re-evaluation of chronic medical conditions, medication management and review of any available recent lab and radiology data.  Preventive health is updated, specifically  Cancer screening and Immunization.   Questions or concerns regarding consultations or procedures which the PT has had in the interim are  addressed. The PT denies any adverse reactions to current medications since the last visit.  There are no new concerns.  There are no specific complaints   ROS Denies recent fever or chills. Denies sinus pressure, nasal congestion, ear pain or sore throat. Denies chest congestion, productive cough or wheezing. Denies chest pains, palpitations and leg swelling Denies abdominal pain, nausea, vomiting,diarrhea or constipation.   Denies dysuria, frequency, hesitancy or incontinence. Denies joint pain, swelling and limitation in mobility. Denies headaches, seizures, numbness, or tingling. Denies depression, anxiety or insomnia. Denies skin break down or rash.   PE  BP 130/80 mmHg  Pulse 65  Resp 16  Ht 5\' 8"  (1.727 m)  Wt 224 lb (101.606 kg)  BMI 34.07 kg/m2  SpO2 98%  Patient alert and oriented and in no cardiopulmonary distress.  HEENT: No facial asymmetry, EOMI,   oropharynx pink and moist.  Neck supple no JVD, no mass.  Chest: Clear to auscultation bilaterally.  CVS: S1, S2 no murmurs, no S3.Regular rate.  ABD: Soft non tender.   Ext: No edema  MS: Adequate ROM spine, shoulders, hips and knees.  Skin: Intact, no ulcerations or rash noted.  Psych: Good eye contact, normal affect. Memory intact not anxious or depressed appearing.  CNS: CN 2-12 intact, power,  normal throughout.no focal deficits noted.   Assessment & Plan   Prediabetes Improved, HBA1C is 6.0 in 03/20156 Patient educated about the importance of limiting  Carbohydrate intake , the need to  commit to daily physical activity for a minimum of 30 minutes , and to commit weight loss. The fact that changes in all these areas will reduce or eliminate all together the development of diabetes is stressed.   Diabetic Labs Latest Ref Rng 06/09/2014 03/09/2014 11/04/2013 06/25/2013 01/19/2013  HbA1c <5.7 % 6.2(H) 5.8(H) 6.0(H) 6.1(H) 6.0(H)  Chol 0 - 200 mg/dL - 142 151 - 188  HDL >39 mg/dL - 51 50 - 42  Calc LDL 0 - 99 mg/dL - 80 91 - 134(H)  Triglycerides <150 mg/dL - 53 49 - 60  Creatinine 0.50 - 1.10 mg/dL 0.80 0.79 0.81 0.84 0.85   BP/Weight 01/18/2015 06/14/2014 03/11/2014 11/18/2013 11/04/2013 1/44/3154 0/0/8676  Systolic BP 195 093 267 124 580 998 338  Diastolic BP 80 72 78 80 78 80 72  Wt. (Lbs) 224 224.4 224.12 219 221.4 216.12 224.04  BMI 34.07 34.13 34.09 33.31 33.67 32.87 34.07   No flowsheet data found.      Hyperlipidemia LDL goal <100 uncontrolled Needs to resume pravachol  Hyperlipidemia:Low fat diet discussed and encouraged.  CMP Latest Ref Rng 06/09/2014 03/09/2014 11/04/2013  Glucose 70 - 99 mg/dL 99 90 77  BUN 6 - 23 mg/dL 11 10 13   Creatinine 0.50 - 1.10 mg/dL 0.80 0.79 0.81  Sodium 135 - 145 mEq/L 140 142 141  Potassium 3.5 - 5.3 mEq/L 4.2 3.7 4.2  Chloride 96 - 112 mEq/L 106 108 105  CO2 19 - 32 mEq/L 26 26 29   Calcium 8.4 - 10.5 mg/dL 9.0 8.9 9.2  Total Protein  6.0 - 8.3 g/dL 6.1 - 6.3  Total Bilirubin 0.2 - 1.2 mg/dL 0.5 - 0.4  Alkaline Phos 39 - 117 U/L 60 - 70  AST 0 - 37 U/L 14 - 15  ALT 0 - 35 U/L 15 - 15    Lipid Panel     Component Value Date/Time   CHOL 142 03/09/2014 0920   TRIG 53 03/09/2014 0920   HDL 51 03/09/2014 0920   CHOLHDL 2.8 03/09/2014 0920   VLDL 11 03/09/2014 0920   LDLCALC 80 03/09/2014 0920         Essential hypertension Controlled, no change in medication DASH diet and commitment to daily physical activity for a minimum of 30 minutes discussed and encouraged, as a part of hypertension management. The importance of  attaining a healthy weight is also discussed.  BP/Weight 01/18/2015 06/14/2014 03/11/2014 11/18/2013 11/04/2013 4/54/0981 10/29/1476  Systolic BP 295 621 308 657 846 962 952  Diastolic BP 80 72 78 80 78 80 72  Wt. (Lbs) 224 224.4 224.12 219 221.4 216.12 224.04  BMI 34.07 34.13 34.09 33.31 33.67 32.87 34.07    CMP Latest Ref Rng 06/09/2014 03/09/2014 11/04/2013  Glucose 70 - 99 mg/dL 99 90 77  BUN 6 - 23 mg/dL 11 10 13   Creatinine 0.50 - 1.10 mg/dL 0.80 0.79 0.81  Sodium 135 - 145 mEq/L 140 142 141  Potassium 3.5 - 5.3 mEq/L 4.2 3.7 4.2  Chloride 96 - 112 mEq/L 106 108 105  CO2 19 - 32 mEq/L 26 26 29   Calcium 8.4 - 10.5 mg/dL 9.0 8.9 9.2  Total Protein 6.0 - 8.3 g/dL 6.1 - 6.3  Total Bilirubin 0.2 - 1.2 mg/dL 0.5 - 0.4  Alkaline Phos 39 - 117 U/L 60 - 70  AST 0 - 37 U/L 14 - 15  ALT 0 - 35 U/L 15 - 15        Obesity UnchangedPatient re-educated about  the importance of commitment to a  minimum of 150 minutes of exercise per week.  The importance of healthy food choices with portion control discussed. Encouraged to start a food diary, count calories and to consider  joining a support group. Sample diet sheets offered. Goals set by the patient for the next several months.   Wt Readings from Last 3 Encounters:  01/18/15 224 lb (101.606 kg)  06/14/14 224 lb 6.4 oz (101.787 kg)  03/11/14 224 lb 1.9 oz (101.66 kg)    Body mass index is 34.07 kg/(m^2).  Current exercise per week 50 minutes.

## 2015-02-09 ENCOUNTER — Encounter: Payer: Self-pay | Admitting: Family Medicine

## 2015-06-06 ENCOUNTER — Other Ambulatory Visit: Payer: Self-pay | Admitting: Family Medicine

## 2015-06-07 LAB — LIPID PANEL
CHOL/HDL RATIO: 2.8 ratio (ref <=5.0)
CHOLESTEROL: 136 mg/dL (ref 125–200)
HDL: 48 mg/dL (ref >=46)
LDL Cholesterol: 72 mg/dL (ref <130)
Triglycerides: 81 mg/dL (ref <150)
VLDL: 16 mg/dL (ref <30)

## 2015-06-07 LAB — COMPLETE METABOLIC PANEL WITH GFR
ALT: 14 U/L (ref 6–29)
AST: 16 U/L (ref 10–35)
Albumin: 3.5 g/dL — ABNORMAL LOW (ref 3.6–5.1)
Alkaline Phosphatase: 58 U/L (ref 33–130)
BUN: 14 mg/dL (ref 7–25)
CO2: 26 mmol/L (ref 20–31)
Calcium: 9.2 mg/dL (ref 8.6–10.4)
Chloride: 107 mmol/L (ref 98–110)
Creat: 0.76 mg/dL (ref 0.60–0.93)
GFR, Est African American: 86 mL/min (ref >=60)
GFR, Est Non African American: 75 mL/min (ref >=60)
GLUCOSE: 85 mg/dL (ref 65–99)
Potassium: 4 mmol/L (ref 3.5–5.3)
SODIUM: 141 mmol/L (ref 135–146)
Total Bilirubin: 0.6 mg/dL (ref 0.2–1.2)
Total Protein: 6.2 g/dL (ref 6.1–8.1)

## 2015-06-07 LAB — CBC
HEMATOCRIT: 43.9 % (ref 36.0–46.0)
HEMOGLOBIN: 14 g/dL (ref 12.0–15.0)
MCH: 26.1 pg (ref 26.0–34.0)
MCHC: 31.9 g/dL (ref 30.0–36.0)
MCV: 81.9 fL (ref 78.0–100.0)
MPV: 9.6 fL (ref 8.6–12.4)
Platelets: 291 10*3/uL (ref 150–400)
RBC: 5.36 MIL/uL — ABNORMAL HIGH (ref 3.87–5.11)
RDW: 15.3 % (ref 11.5–15.5)
WBC: 5.9 10*3/uL (ref 4.0–10.5)

## 2015-06-07 LAB — TSH: TSH: 6.076 u[IU]/mL — ABNORMAL HIGH (ref 0.350–4.500)

## 2015-06-07 LAB — HEMOGLOBIN A1C
HEMOGLOBIN A1C: 6 % — AB (ref <5.7)
MEAN PLASMA GLUCOSE: 126 mg/dL — AB (ref <117)

## 2015-06-07 LAB — VITAMIN D 25 HYDROXY (VIT D DEFICIENCY, FRACTURES): VIT D 25 HYDROXY: 16 ng/mL — AB (ref 30–100)

## 2015-06-08 LAB — T3, FREE: T3, Free: 3.1 pg/mL (ref 2.3–4.2)

## 2015-06-08 LAB — T4, FREE: Free T4: 1.03 ng/dL (ref 0.80–1.80)

## 2015-06-15 ENCOUNTER — Encounter: Payer: Self-pay | Admitting: Family Medicine

## 2015-06-15 ENCOUNTER — Ambulatory Visit (INDEPENDENT_AMBULATORY_CARE_PROVIDER_SITE_OTHER): Payer: Medicare Other | Admitting: Family Medicine

## 2015-06-15 VITALS — BP 124/80 | HR 73 | Resp 16 | Ht 68.0 in | Wt 219.0 lb

## 2015-06-15 DIAGNOSIS — Z0000 Encounter for general adult medical examination without abnormal findings: Secondary | ICD-10-CM

## 2015-06-15 DIAGNOSIS — E785 Hyperlipidemia, unspecified: Secondary | ICD-10-CM

## 2015-06-15 DIAGNOSIS — R7303 Prediabetes: Secondary | ICD-10-CM

## 2015-06-15 DIAGNOSIS — R7989 Other specified abnormal findings of blood chemistry: Secondary | ICD-10-CM

## 2015-06-15 DIAGNOSIS — R7309 Other abnormal glucose: Secondary | ICD-10-CM

## 2015-06-15 DIAGNOSIS — Z23 Encounter for immunization: Secondary | ICD-10-CM | POA: Diagnosis not present

## 2015-06-15 DIAGNOSIS — Z1231 Encounter for screening mammogram for malignant neoplasm of breast: Secondary | ICD-10-CM

## 2015-06-15 DIAGNOSIS — I10 Essential (primary) hypertension: Secondary | ICD-10-CM

## 2015-06-15 NOTE — Assessment & Plan Note (Signed)
After obtaining informed consent, the vaccine is  administered by LPN.  

## 2015-06-15 NOTE — Assessment & Plan Note (Signed)

## 2015-06-15 NOTE — Patient Instructions (Addendum)
F/u in 4 month, call if you need me before Flu vaccine available from Monday to Thursday starting in September.  Please come and get your vaccine to protect yourself  and your family.   Mammogram to be scheduled  You are referred for [place] of thyroid gland and will likely be started on low dose of thyroid medication   Prevnar today   Non fast chem 7 , hBA1C and TSH in January

## 2015-06-15 NOTE — Progress Notes (Signed)
Subjective:    Patient ID: Tammy Galvan, female    DOB: 1932/12/23, 81 y.o.   MRN: [id number]  HPI Preventive Screening-Counseling & Management   Patient present here today for a Medicare annual wellness visit.   Current Problems (verified)   Medications Prior to Visit Allergies (verified)   PAST HISTORY  Family History (verified)  Social History Widowed since 2001, 3 children and her grandson lives with her currently. Retired from [employer]  Current exercise habits: Some but wants to do more, mostly walks a few days a week but she plans to do more since temps are cooling off. Also bowls     Dietary issues discussed: Low carb low fat and eating more fruits and vegatables and lean white meat    Cardiac risk factors: Father had heart disease   Depression Screen  (Note: if answer to either of the following is "Yes", a more complete depression screening is indicated)   Over the past two weeks, have you felt down, depressed or hopeless? No  Over the past two weeks, have you felt little interest or pleasure in doing things? No  Have you lost interest or pleasure in daily life? No  Do you often feel hopeless? No  Do you cry easily over simple problems? No   Activities of Daily Living  In your present state of health, do you have any difficulty performing the following activities?  Driving?: No Managing money?: No Feeding yourself?:No Getting from bed to chair?:No Climbing a flight of stairs?:No Preparing food and eating?:No Bathing or showering?:No Getting dressed?:No Getting to the toilet?:No Using the toilet?:No Moving around from place to place?: No  Fall Risk Assessment In the past year have you fallen or had a near fall?:No Are you currently taking any medications that make you dizzy:No   Hearing Difficulties: No Do you often ask people to speak up or repeat themselves?: doesn't hear very well bilaterally but hearing clinic can't find a  reason  Do you experience ringing or noises in your ears?:No Do you have difficulty understanding soft or whispered voices?:No  Cognitive Testing  Alert? Yes Normal Appearance?Yes  Oriented to person? Yes Place? Yes  Time? Yes  Displays appropriate judgment?Yes  Can read the correct time from a watch face? yes Are you having problems remembering things?No  Advanced Directives have been discussed with the patient?Yes, no will but brochure given     List the Names of Other Physician/Practitioners you currently use:  None   Indicate any recent Medical Services you may have received from other than [facility] providers in the past year (date may be approximate).   Assessment:    Annual Wellness Exam   Plan:     Medicare Attestation  I have personally reviewed:  The patient's medical and social history  Their use of alcohol, tobacco or illicit drugs  Their current medications and supplements  The patient's functional ability including ADLs,fall risks, home safety risks, cognitive, and hearing and visual impairment  Diet and physical activities  Evidence for depression or mood disorders  The patient's weight, height, BMI, and visual acuity have been recorded in the chart. I have made referrals, counseling, and provided education to the patient based on review of the above and I have provided the patient with a written personalized care plan for preventive services.      Review of Systems     Objective:   Physical Exam  BP 124/80 mmHg  Pulse  73  Resp 16  Ht 5\' 8"  (1.727 m)  Wt 219 lb (99.338 kg)  BMI 33.31 kg/m2  SpO2 96%  Lab review: increased TSH, needs thyroid US and will  be started on low dose  supplement if [place] is normal     Assessment & Plan:  Medicare annual wellness visit, subsequent Annual exam as documented. Counseling done  re healthy lifestyle involving commitment to 150 minutes exercise per week, heart healthy diet, and attaining healthy weight.The  importance of adequate sleep also discussed. Regular seat belt use and home safety, is also discussed. Changes in health habits are decided on by the patient with goals and time frames  set for achieving them. Immunization and cancer screening needs are specifically addressed at this visit.   Need for vaccination with 13-polyvalent pneumococcal conjugate vaccine After obtaining informed consent, the vaccine is  administered by LPN.   Abnormal thyroid stimulating hormone (TSH) level Progressively worsening TSH level, despite normal T3 and T4 , pt has subclinical hypothyroidism, and should  Benefit from low dose supplement, needs [place] to establish anatomy prior to starting medication , order entered. No goiter on exam

## 2015-06-17 NOTE — Assessment & Plan Note (Signed)
Progressively worsening TSH level, despite normal T3 and T4 , pt has subclinical hypothyroidism, and should  Benefit from low dose supplement, needs [place] to establish anatomy prior to starting medication , order entered. No goiter on exam

## 2015-06-20 ENCOUNTER — Other Ambulatory Visit: Payer: Self-pay | Admitting: Family Medicine

## 2015-06-20 ENCOUNTER — Ambulatory Visit (HOSPITAL_COMMUNITY)
Admission: RE | Admit: 2015-06-20 | Discharge: 2015-06-20 | Disposition: A | Discharge status: Home / Self Care | Payer: Medicare Other | Source: Ambulatory Visit | Attending: Family Medicine | Admitting: Family Medicine

## 2015-06-20 DIAGNOSIS — E041 Nontoxic single thyroid nodule: Secondary | ICD-10-CM | POA: Diagnosis not present

## 2015-06-20 DIAGNOSIS — R7989 Other specified abnormal findings of blood chemistry: Secondary | ICD-10-CM

## 2015-06-20 DIAGNOSIS — R946 Abnormal results of thyroid function studies: Secondary | ICD-10-CM | POA: Insufficient documentation

## 2015-06-20 DIAGNOSIS — E039 Hypothyroidism, unspecified: Secondary | ICD-10-CM | POA: Insufficient documentation

## 2015-06-20 DIAGNOSIS — E049 Nontoxic goiter, unspecified: Secondary | ICD-10-CM

## 2015-06-22 ENCOUNTER — Ambulatory Visit (HOSPITAL_COMMUNITY)
Admission: RE | Admit: 2015-06-22 | Discharge: 2015-06-22 | Disposition: A | Discharge status: Home / Self Care | Payer: Medicare Other | Source: Ambulatory Visit | Attending: Family Medicine | Admitting: Family Medicine

## 2015-06-22 ENCOUNTER — Ambulatory Visit (HOSPITAL_COMMUNITY): Payer: Medicare Other

## 2015-06-22 DIAGNOSIS — Z1231 Encounter for screening mammogram for malignant neoplasm of breast: Secondary | ICD-10-CM | POA: Insufficient documentation

## 2015-07-25 ENCOUNTER — Other Ambulatory Visit: Payer: Self-pay | Admitting: Family Medicine

## 2015-08-15 ENCOUNTER — Ambulatory Visit (INDEPENDENT_AMBULATORY_CARE_PROVIDER_SITE_OTHER): Payer: Medicare Other | Admitting: "Endocrinology

## 2015-08-15 ENCOUNTER — Encounter: Payer: Self-pay | Admitting: "Endocrinology

## 2015-08-15 VITALS — BP 131/76 | HR 65 | Ht 68.0 in | Wt 220.0 lb

## 2015-08-15 DIAGNOSIS — E042 Nontoxic multinodular goiter: Secondary | ICD-10-CM | POA: Diagnosis not present

## 2015-08-15 NOTE — Progress Notes (Signed)
HPI  Tammy Galvan is a 81 y.o.-year-old female, referred by her PCP, Dr.Simpson, for evaluation for multinodular goiter.  Thyroid U/S: From 06/20/2015 was reviewed. It showed right lobe 4.1 cm with a 0.3 cm hypoechoic solid nodule on the superior aspect. Left lobe was measuring 4.2 cm with 2 x 1 x 1.4 cm mixed echogenic partially cystic predominantly solid nodule on the superior aspect. Isthmus measures 0.3 cm.   for proceeding TSH on 06/06/2015 was abnormally high at 6.076. Patient is not on thyroid hormones nor antithyroid medications. She denies history of goiter, family history of thyroid cancer, and any exposure to neck radiation. Pt denies - feeling nodules in neck - hoarseness - dysphagia - choking - SOB with lying down  I reviewed pt's thyroid tests: Lab Results  Component Value Date   TSH 6.076* 06/06/2015   TSH 4.787* 03/09/2014   TSH 4.042 01/19/2013   TSH 3.313 08/23/2011   TSH 4.480 07/13/2010   TSH 4.655* 08/31/2009   TSH 6.273* 12/06/2008   TSH 4.390 09/02/2007   FREET4 1.03 06/06/2015   FREET4 0.97 03/09/2014   FREET4 0.99 12/07/2008     Pt  denies : - fatigue - heat intolerance/cold intolerance - tremors - palpitations - anxiety/depression - hyperdefecation/constipation - weight loss/weight gain - dry skin - hair loss she reports progressive weight gain.  .   ROS: Constitutional: +progressive weight gain/loss, no fatigue, no subjective hyperthermia/hypothermia Eyes: no blurry vision, no xerophthalmia ENT: no sore throat, no nodules palpated in throat, no dysphagia/odynophagia, no hoarseness Cardiovascular: no CP/SOB/palpitations/leg swelling Respiratory: no cough/SOB Gastrointestinal: no N/V/D/C Musculoskeletal: no muscle/joint aches Skin: no rashes Neurological: no tremors/numbness/tingling/dizziness Psychiatric: no depression/anxiety  PE: BP 131/76 mmHg  Pulse 65  Ht 5\' 8"  (1.727 m)  Wt 220 lb (99.791 kg)  BMI 33.46 kg/m2  SpO2 96% Wt  Readings from Last 3 Encounters:  08/15/15 220 lb (99.791 kg)  06/15/15 219 lb (99.338 kg)  01/18/15 224 lb (101.606 kg)    Constitutional: overweight, in NAD Eyes: PERRLA, EOMI, no exophthalmos ENT: moist mucous membranes, left lobe thyroid nodule is palpable, no cervical lymphadenopathy Cardiovascular: RRR, No MRG Respiratory: CTA B Gastrointestinal: abdomen soft, NT, ND, BS+ Musculoskeletal: no deformities, strength intact in all 4;  Skin: moist, warm, no rashes Neurological: no tremor with outstretched hands, DTR normal in all 4  ASSESSMENT: 1. MNG 2. Subclinical hypothyroidism  PLAN: 1. MNG  - I reviewed the images of her thyroid ultrasound along with the patient. I pointed out that the dominant nodule  on the left lobe is large enough to need further study to r/o malignancy.  Accordingly I have recommended fine needle aspiration of the dominant nodule on the left lobe.  The rest of the nodules on the right lobe are too small to pose any malignancy risk.  - she will return in 3 weeks to discuss biopsy results with me. If findings are suspicious for malignancy thyroidectomy is  therapy of choice. If her FNA  findings are benign, she will be considered for low-dose levothyroxine to correct TSH to target.  I advised her to maintain close follow up with her primary care physician for her primary care needs.  Glade Lloyd, MD  Tel. [phone], Fax [phone]

## 2015-08-15 NOTE — Patient Instructions (Signed)
You have a nodule in your thyroid.  I would recommend that we obtain a sample of cells from the nodule ( biopsy) to make sure this is not malignant ( cancer). This is done in interventional radiology department, with ultrasound guidance. It is called a fine needle aspiration (FNA), and the procedure is usually done by inserting a needle into the nodule and taking out cells for further examination under the microscope. We will ger results in  Approximately 2 weeks.

## 2015-08-23 ENCOUNTER — Ambulatory Visit (HOSPITAL_COMMUNITY)
Admission: RE | Admit: 2015-08-23 | Discharge: 2015-08-23 | Disposition: A | Discharge status: Home / Self Care | Payer: Medicare Other | Source: Ambulatory Visit | Attending: "Endocrinology | Admitting: "Endocrinology

## 2015-08-23 DIAGNOSIS — E041 Nontoxic single thyroid nodule: Secondary | ICD-10-CM | POA: Insufficient documentation

## 2015-08-23 DIAGNOSIS — E042 Nontoxic multinodular goiter: Secondary | ICD-10-CM

## 2015-08-23 MED ORDER — LIDOCAINE HCL (PF) 2 % IJ SOLN
INTRAMUSCULAR | Status: AC
  Filled 2015-08-23: qty 10

## 2015-08-23 NOTE — Discharge Instructions (Signed)

## 2015-08-23 NOTE — Procedures (Signed)
PreOperative Dx: LEFT thyroid nodule Postoperative Dx: LEFT thyroid nodule Procedure:   US guided FNA of LEFT thyroid nodule Radiologist:  Thornton Papas Anesthesia:  2 ml ml of 2% lidocaine Specimen:  FNA x 3  EBL:   < 1 ml Complications: None

## 2015-08-29 ENCOUNTER — Encounter: Payer: Self-pay | Admitting: "Endocrinology

## 2015-08-29 ENCOUNTER — Ambulatory Visit (INDEPENDENT_AMBULATORY_CARE_PROVIDER_SITE_OTHER): Payer: Medicare Other | Admitting: "Endocrinology

## 2015-08-29 VITALS — BP 127/79 | HR 79 | Ht 68.0 in | Wt 221.0 lb

## 2015-08-29 DIAGNOSIS — E042 Nontoxic multinodular goiter: Secondary | ICD-10-CM

## 2015-08-29 DIAGNOSIS — E038 Other specified hypothyroidism: Secondary | ICD-10-CM

## 2015-08-29 DIAGNOSIS — E039 Hypothyroidism, unspecified: Secondary | ICD-10-CM | POA: Insufficient documentation

## 2015-08-29 MED ORDER — LEVOTHYROXINE SODIUM 50 MCG PO CAPS: 50.0000 ug | ORAL_CAPSULE | Freq: Every day | ORAL | Status: DC

## 2015-08-29 NOTE — Progress Notes (Signed)
HPI  Tammy Galvan is a 81 y.o.-year-old female, referred by her PCP, Dr.Simpson, for evaluation for multinodular goiter. Her fine-needle aspiration showed benign follicular adenoma, [name].  Thyroid U/S: From 06/20/2015 was reviewed. It showed right lobe 4.1 cm with a 0.3 cm hypoechoic solid nodule on the superior aspect. Left lobe was measuring 4.2 cm with 2 x 1 x 1.4 cm mixed echogenic partially cystic predominantly solid nodule on the superior aspect. Isthmus measures 0.3 cm.  Her  proceeding TSH on 06/06/2015 was abnormally high at 6.076. Patient is not on thyroid hormones nor antithyroid medications. She denies history of goiter, family history of thyroid cancer, and any exposure to neck radiation. Pt denies - feeling nodules in neck - hoarseness - dysphagia - choking - SOB with lying down  I reviewed pt's thyroid tests: Lab Results  Component Value Date   TSH 6.076* 06/06/2015   TSH 4.787* 03/09/2014   TSH 4.042 01/19/2013   TSH 3.313 08/23/2011   TSH 4.480 07/13/2010   TSH 4.655* 08/31/2009   TSH 6.273* 12/06/2008   TSH 4.390 09/02/2007   FREET4 1.03 06/06/2015   FREET4 0.97 03/09/2014   FREET4 0.99 12/07/2008     Pt  denies : - fatigue - heat intolerance/cold intolerance - tremors - palpitations - anxiety/depression - hyperdefecation/constipation - weight loss/weight gain - dry skin - hair loss she reports progressive weight gain.    ROS: Constitutional: +progressive weight gain/loss, no fatigue, no subjective hyperthermia/hypothermia Eyes: no blurry vision, no xerophthalmia ENT: no sore throat, no nodules palpated in throat, no dysphagia/odynophagia, no hoarseness Cardiovascular: no CP/SOB/palpitations/leg swelling Respiratory: no cough/SOB Gastrointestinal: no N/V/D/C Musculoskeletal: no muscle/joint aches Skin: no rashes Neurological: no tremors/numbness/tingling/dizziness Psychiatric: no depression/anxiety  PE: BP 127/79 mmHg  Pulse  79  Ht 5\' 8"  (1.727 m)  Wt 221 lb (100.245 kg)  BMI 33.61 kg/m2  SpO2 99% Wt Readings from Last 3 Encounters:  08/29/15 221 lb (100.245 kg)  08/15/15 220 lb (99.791 kg)  06/15/15 219 lb (99.338 kg)    Constitutional: overweight, in NAD Eyes: PERRLA, EOMI, no exophthalmos ENT: moist mucous membranes, left lobe thyroid nodule is palpable, no cervical lymphadenopathy Cardiovascular: RRR, No MRG Respiratory: CTA B Gastrointestinal: abdomen soft, NT, ND, BS+ Musculoskeletal: no deformities, strength intact in all 4;  Skin: moist, warm, no rashes Neurological: no tremor with outstretched hands, DTR normal in all 4  ASSESSMENT: 1. MNG 2. Subclinical hypothyroidism  PLAN: 1. MNG  -She is status post fine needle aspiration which was reported to be benign follicular adenoma-[name]. -She will not need intervention for this at this point. -She may need periodic surveillance with repeat ultrasound if necessary.  -Due to high TSH, which is significantly above target, she is approached for  low-dose levothyroxine to correct TSH to target. I will initiate levothyroxine 50 g by mouth every morning.  - We discussed about correct intake of levothyroxine, at fasting, with water, separated by at least 30 minutes from breakfast, and separated by more than 4 hours from calcium, iron, multivitamins, acid reflux medications (PPIs). -Patient is made aware of the fact that thyroid hormone replacement is needed for life, dose to be adjusted by periodic monitoring of thyroid function tests.  I advised her to maintain close follow up with her primary care physician for her primary care needs.  Glade Lloyd, MD  Tel. [phone], Fax [phone]

## 2015-10-10 ENCOUNTER — Telehealth: Payer: Self-pay | Admitting: Family Medicine

## 2015-10-10 NOTE — Telephone Encounter (Signed)
Patient states that she has had flare in right knee since Friday of last week.  Would like to come in for injections.

## 2015-10-10 NOTE — Telephone Encounter (Signed)
Injections OK for am toradol 60 mg , depo medrol 80 mfg and flyu vaccine pls

## 2015-10-10 NOTE — Telephone Encounter (Signed)
Patient is asking if she can come in to get an injection for right knee pain, please advise?

## 2015-10-10 NOTE — Telephone Encounter (Signed)
Patient aware and will come in for nurse visit

## 2015-10-11 ENCOUNTER — Ambulatory Visit (INDEPENDENT_AMBULATORY_CARE_PROVIDER_SITE_OTHER): Payer: Medicare Other

## 2015-10-11 DIAGNOSIS — M25561 Pain in right knee: Secondary | ICD-10-CM | POA: Diagnosis not present

## 2015-10-11 MED ORDER — METHYLPREDNISOLONE ACETATE 80 MG/ML IJ SUSP
80.0000 mg | Freq: Once | INTRAMUSCULAR | Status: AC
  Administered 2015-10-11: 80 mg via INTRAMUSCULAR

## 2015-10-11 MED ORDER — KETOROLAC TROMETHAMINE 60 MG/2ML IM SOLN
60.0000 mg | Freq: Once | INTRAMUSCULAR | Status: AC
  Administered 2015-10-11: 60 mg via INTRAMUSCULAR

## 2015-10-11 NOTE — Progress Notes (Signed)
Patient in for injections for right knee pain flare x 1 week.  Injections of Toradal and Depo Medrol given as ordered.  Patient refused flu shot for today and states that she will get when she get when she comes for her next visit (end of Dec).

## 2015-10-19 ENCOUNTER — Encounter: Payer: Self-pay | Admitting: Family Medicine

## 2015-10-19 ENCOUNTER — Ambulatory Visit (INDEPENDENT_AMBULATORY_CARE_PROVIDER_SITE_OTHER): Payer: Medicare Other | Admitting: Family Medicine

## 2015-10-19 ENCOUNTER — Ambulatory Visit (HOSPITAL_COMMUNITY)
Admission: RE | Admit: 2015-10-19 | Discharge: 2015-10-19 | Disposition: A | Discharge status: Home / Self Care | Payer: Medicare Other | Source: Ambulatory Visit | Attending: Family Medicine | Admitting: Family Medicine

## 2015-10-19 VITALS — BP 124/78 | HR 64 | Resp 16 | Ht 68.0 in | Wt 221.0 lb

## 2015-10-19 DIAGNOSIS — E669 Obesity, unspecified: Secondary | ICD-10-CM

## 2015-10-19 DIAGNOSIS — Z23 Encounter for immunization: Secondary | ICD-10-CM | POA: Insufficient documentation

## 2015-10-19 DIAGNOSIS — I10 Essential (primary) hypertension: Secondary | ICD-10-CM | POA: Diagnosis not present

## 2015-10-19 DIAGNOSIS — E559 Vitamin D deficiency, unspecified: Secondary | ICD-10-CM | POA: Insufficient documentation

## 2015-10-19 DIAGNOSIS — E785 Hyperlipidemia, unspecified: Secondary | ICD-10-CM | POA: Diagnosis not present

## 2015-10-19 DIAGNOSIS — M25561 Pain in right knee: Secondary | ICD-10-CM | POA: Insufficient documentation

## 2015-10-19 DIAGNOSIS — M25569 Pain in unspecified knee: Secondary | ICD-10-CM | POA: Insufficient documentation

## 2015-10-19 DIAGNOSIS — R7303 Prediabetes: Secondary | ICD-10-CM

## 2015-10-19 MED ORDER — IBUPROFEN 600 MG PO TABS: ORAL_TABLET | ORAL | Status: DC

## 2015-10-19 MED ORDER — PREDNISONE 5 MG PO TABS: ORAL_TABLET | ORAL | Status: DC

## 2015-10-19 MED ORDER — POTASSIUM CHLORIDE CRYS ER 20 MEQ PO TBCR: EXTENDED_RELEASE_TABLET | ORAL | Status: DC

## 2015-10-19 MED ORDER — FAMOTIDINE 40 MG PO TABS: 40.0000 mg | ORAL_TABLET | Freq: Every day | ORAL | Status: DC

## 2015-10-19 MED ORDER — ERGOCALCIFEROL 1.25 MG (50000 UT) PO CAPS: 50000.0000 [IU] | ORAL_CAPSULE | ORAL | Status: DC

## 2015-10-19 NOTE — Assessment & Plan Note (Signed)
Updated lab needed at/ before next visit. Patient educated about the importance of limiting  Carbohydrate intake , the need to commit to daily physical activity for a minimum of 30 minutes , and to commit weight loss. The fact that changes in all these areas will reduce or eliminate all together the development of diabetes is stressed.   Diabetic Labs Latest Ref Rng 06/06/2015 01/11/2015 06/09/2014 03/09/2014 11/04/2013  HbA1c <5.7 % 6.0(H) 6.0(H) 6.2(H) 5.8(H) 6.0(H)  Chol 125 - 200 mg/dL 136 178 - 142 151  HDL >=46 mg/dL 48 50 - 51 50  Calc LDL <130 mg/dL 72 116(H) - 80 91  Triglycerides <150 mg/dL 81 60 - 53 49  Creatinine 0.60 - 0.93 mg/dL 0.76 0.79 0.80 0.79 0.81   BP/Weight 10/19/2015 08/29/2015 08/23/2015 08/15/2015 06/15/2015 01/18/2015 [id number]  Systolic BP A999333 [id number] [id number] A999333 A999333 [id number] [id number]  Diastolic BP 78 79 75 76 80 80 72  Wt. (Lbs) 221 221 - 220 219 224 224.4  BMI 33.61 33.61 - 33.46 33.31 34.07 34.13   No flowsheet data found.

## 2015-10-19 NOTE — Assessment & Plan Note (Signed)
Controlled, no change in medication DASH diet and commitment to daily physical activity for a minimum of 30 minutes discussed and encouraged, as a part of hypertension management. The importance of attaining a healthy weight is also discussed.  BP/Weight 10/19/2015 08/29/2015 08/23/2015 08/15/2015 06/15/2015 01/18/2015 [id number]  Systolic BP A999333 [id number] 123456 A999333 A999333 [id number] [id number]  Diastolic BP 78 79 75 76 80 80 72  Wt. (Lbs) 221 221 - 220 219 224 224.4  BMI 33.61 33.61 - 33.46 33.31 34.07 34.13

## 2015-10-19 NOTE — Assessment & Plan Note (Signed)
Deteriorated. Patient re-educated about  the importance of commitment to a  minimum of 150 minutes of exercise per week.  The importance of healthy food choices with portion control discussed. Encouraged to start a food diary, count calories and to consider  joining a support group. Sample diet sheets offered. Goals set by the patient for the next several months.   Weight /BMI 10/19/2015 08/29/2015 08/15/2015  WEIGHT 221 lb 221 lb 220 lb  HEIGHT 5\' 8"  5\' 8"  5\' 8"   BMI 33.61 kg/m2 33.61 kg/m2 33.46 kg/m2    Current exercise per week 90 minutes.

## 2015-10-19 NOTE — Assessment & Plan Note (Addendum)
2 week history, no known aggravating factor.Exam consistent with mild osteoarthritis, will obtain xray of joint today Improved 80 % with injections IM, will send 5 day couse of additional anti inflammatories

## 2015-10-19 NOTE — Assessment & Plan Note (Signed)
Needs to commit to weekly vit D , she understands and will comply

## 2015-10-19 NOTE — Progress Notes (Signed)
Subjective:    Patient ID: Tammy Galvan, female    DOB: 22-Feb-1933, 81 y.o.   MRN: [id number]  HPI   Tammy Galvan     MRN: [id number]      DOB: 06-02-1933   HPI Tammy Galvan is here for follow up and re-evaluation of chronic medical conditions, medication management and review of any available recent lab and radiology data.  Preventive health is updated, specifically  Cancer screening and Immunization.   Questions or concerns regarding consultations or procedures which the PT has had in the interim are  addressed. The PT denies any adverse reactions to current medications since the last visit.  2 week h/o acute right knee pain and swelling and stiffness, no acute trauma, no instability, first epiode, was rated at a 10 now  A 4 to 5 after IM Toradol and depo medrol 1 week ago  ROS Denies recent fever or chills. Denies sinus pressure, nasal congestion, ear pain or sore throat. Denies chest congestion, productive cough or wheezing. Denies chest pains, palpitations and leg swelling Denies abdominal pain, nausea, vomiting,diarrhea or constipation.   Denies dysuria, frequency, hesitancy or incontinence.  Denies headaches, seizures, numbness, or tingling. Denies depression, anxiety or insomnia. Denies skin break down or rash.   PE  BP 124/78 mmHg  Pulse 64  Resp 16  Ht 5\' 8"  (1.727 m)  Wt 221 lb (100.245 kg)  BMI 33.61 kg/m2  SpO2 97%  Patient alert and oriented and in no cardiopulmonary distress.  HEENT: No facial asymmetry, EOMI,   oropharynx pink and moist.  Neck supple no JVD, no mass.  Chest: Clear to auscultation bilaterally.  CVS: S1, S2 no murmurs, no S3.Regular rate.  ABD: Soft non tender.   Ext: No edema   MS: reduced ROM  in right  Knee which is swollen and has crepitus.  Skin: Intact, no ulcerations or rash noted.  Psych: Good eye contact, normal affect. Memory intact not anxious or depressed appearing.  CNS: CN 2-12 intact, power,  normal  throughout.no focal deficits noted.   Assessment & Plan   Knee pain, acute 2 week history, no known aggravating factor.Exam consistent with mild osteoarthritis, will obtain xray of joint today Improved 80 % with injections IM, will send 5 day couse of additional anti inflammatories  Essential hypertension Controlled, no change in medication DASH diet and commitment to daily physical activity for a minimum of 30 minutes discussed and encouraged, as a part of hypertension management. The importance of attaining a healthy weight is also discussed.  BP/Weight 10/19/2015 08/29/2015 08/23/2015 08/15/2015 06/15/2015 01/18/2015 [id number]  Systolic BP A999333 [id number] 123456 A999333 A999333 [id number] 123456  Diastolic BP 78 79 75 76 80 80 72  Wt. (Lbs) 221 221 - 220 219 224 224.4  BMI 33.61 33.61 - 33.46 33.31 34.07 34.13        Hyperlipidemia LDL goal <100 Hyperlipidemia:Low fat diet discussed and encouraged.   Lipid Panel  Lab Results  Component Value Date   CHOL 136 06/06/2015   HDL 48 06/06/2015   LDLCALC 72 06/06/2015   TRIG 81 06/06/2015   CHOLHDL 2.8 06/06/2015  Controlled, no change in medication Updated lab needed at/ before next visit.      Prediabetes Updated lab needed at/ before next visit. Patient educated about the importance of limiting  Carbohydrate intake , the need to commit to daily physical activity for a minimum of 30 minutes , and to commit weight loss. The fact that changes in  all these areas will reduce or eliminate all together the development of diabetes is stressed.   Diabetic Labs Latest Ref Rng 06/06/2015 01/11/2015 06/09/2014 03/09/2014 11/04/2013  HbA1c <5.7 % 6.0(H) 6.0(H) 6.2(H) 5.8(H) 6.0(H)  Chol 125 - 200 mg/dL 136 178 - 142 151  HDL >=46 mg/dL 48 50 - 51 50  Calc LDL <130 mg/dL 72 116(H) - 80 91  Triglycerides <150 mg/dL 81 60 - 53 49  Creatinine 0.60 - 0.93 mg/dL 0.76 0.79 0.80 0.79 0.81   BP/Weight 10/19/2015 08/29/2015 08/23/2015 08/15/2015 06/15/2015 01/18/2015  [id number]  Systolic BP A999333 [id number] [id number] A999333 A999333 [id number] [id number]  Diastolic BP 78 79 75 76 80 80 72  Wt. (Lbs) 221 221 - 220 219 224 224.4  BMI 33.61 33.61 - 33.46 33.31 34.07 34.13   No flowsheet data found.     Vitamin D deficiency Needs to commit to weekly vit D , she understands and will comply  Obesity Deteriorated. Patient re-educated about  the importance of commitment to a  minimum of 150 minutes of exercise per week.  The importance of healthy food choices with portion control discussed. Encouraged to start a food diary, count calories and to consider  joining a support group. Sample diet sheets offered. Goals set by the patient for the next several months.   Weight /BMI 10/19/2015 08/29/2015 08/15/2015  WEIGHT 221 lb 221 lb 220 lb  HEIGHT 5\' 8"  5\' 8"  5\' 8"   BMI 33.61 kg/m2 33.61 kg/m2 33.46 kg/m2    Current exercise per week 90 minutes.   Need for prophylactic vaccination and inoculation against influenza After obtaining informed consent, the vaccine is  administered by LPN.       Review of Systems     Objective:   Physical Exam        Assessment & Plan:

## 2015-10-19 NOTE — Assessment & Plan Note (Signed)
After obtaining informed consent, the vaccine is  administered by LPN.  

## 2015-10-19 NOTE — Patient Instructions (Signed)
Annual physical exam in 4 month, call if you need me sooner  Flu vaccine today  Medications (3) are sent for knee pain, also get an x ray today. Likely due to arhritis, which is mild on my exam today  New is once weekly vitamin D take for next 12 months, refill every month  Please work on good  health habits so that your health will improve. 1. Commitment to daily physical activity for 30 to 60  minutes, if you are able to do this.  2. Commitment to wise food choices. Aim for half of your  food intake to be vegetable and fruit, one quarter starchy foods, and one quarter protein. Try to eat on a regular schedule  3 meals per day, snacking between meals should be limited to vegetables or fruits or small portions of nuts. 64 ounces of water per day is generally recommended, unless you have specific health conditions, like heart failure or kidney failure where you will need to limit fluid intake.  3. Commitment to sufficient and a  good quality of physical and mental rest daily, generally between 6 to 8 hours per day.  WITH PERSISTANCE AND PERSEVERANCE, THE IMPOSSIBLE , BECOMES THE NORM!  Fasting lipid, cmp , HBA1C in 4 month  Congrats on 74 year birthday, and many more  CONGRATS on 82 years

## 2015-10-19 NOTE — Assessment & Plan Note (Signed)
Hyperlipidemia:Low fat diet discussed and encouraged.   Lipid Panel  Lab Results  Component Value Date   CHOL 136 06/06/2015   HDL 48 06/06/2015   LDLCALC 72 06/06/2015   TRIG 81 06/06/2015   CHOLHDL 2.8 06/06/2015  Controlled, no change in medication Updated lab needed at/ before next visit.

## 2015-11-29 ENCOUNTER — Ambulatory Visit: Payer: Medicare Other | Admitting: "Endocrinology

## 2015-12-05 ENCOUNTER — Encounter: Payer: Self-pay | Admitting: "Endocrinology

## 2015-12-05 ENCOUNTER — Other Ambulatory Visit: Payer: Self-pay | Admitting: "Endocrinology

## 2015-12-05 ENCOUNTER — Encounter: Payer: Medicare Other | Admitting: "Endocrinology

## 2015-12-05 LAB — T4, FREE: Free T4: 1.2 ng/dL (ref 0.8–1.8)

## 2015-12-05 LAB — TSH: TSH: 1.9 m[IU]/L

## 2015-12-14 ENCOUNTER — Ambulatory Visit (INDEPENDENT_AMBULATORY_CARE_PROVIDER_SITE_OTHER): Payer: Medicare Other | Admitting: "Endocrinology

## 2015-12-14 ENCOUNTER — Encounter: Payer: Self-pay | Admitting: "Endocrinology

## 2015-12-14 VITALS — BP 134/73 | HR 61 | Ht 68.0 in | Wt 221.0 lb

## 2015-12-14 DIAGNOSIS — E042 Nontoxic multinodular goiter: Secondary | ICD-10-CM | POA: Diagnosis not present

## 2015-12-14 DIAGNOSIS — E038 Other specified hypothyroidism: Secondary | ICD-10-CM

## 2015-12-14 MED ORDER — LEVOTHYROXINE SODIUM 50 MCG PO CAPS: 50.0000 ug | ORAL_CAPSULE | Freq: Every day | ORAL | Status: DC

## 2015-12-14 NOTE — Progress Notes (Signed)
HPI  Tammy Galvan is a 82 y.o.-year-old female, here to follow-up for hypothyroidism on levothyroxine.   -She was seen for multinodular goiter, status post fine needle aspiration which showed benign follicular adenoma, [name].  Thyroid U/S: From 06/20/2015 was reviewed. It showed right lobe 4.1 cm with a 0.3 cm hypoechoic solid nodule on the superior aspect. Left lobe was measuring 4.2 cm with 2 x 1 x 1.4 cm mixed echogenic partially cystic predominantly solid nodule on the superior aspect. Isthmus measures 0.3 cm.    -She is on levothyroxine 50 g by mouth every morning., Complies very well. She feels better, no new complaints.  I reviewed pt's thyroid tests: Lab Results  Component Value Date   TSH 1.90 12/05/2015   TSH 6.076* 06/06/2015   TSH 4.787* 03/09/2014   TSH 4.042 01/19/2013   TSH 3.313 08/23/2011   TSH 4.480 07/13/2010   TSH 4.655* 08/31/2009   TSH 6.273* 12/06/2008   TSH 4.390 09/02/2007   FREET4 1.2 12/05/2015   FREET4 1.03 06/06/2015   FREET4 0.97 03/09/2014   FREET4 0.99 12/07/2008    ROS: Constitutional: +progressive weight gain/loss, no fatigue, no subjective hyperthermia/hypothermia Eyes: no blurry vision, no xerophthalmia ENT: no sore throat, no nodules palpated in throat, no dysphagia/odynophagia, no hoarseness Cardiovascular: no CP/SOB/palpitations/leg swelling Respiratory: no cough/SOB Gastrointestinal: no N/V/D/C Musculoskeletal: no muscle/joint aches Skin: no rashes Neurological: no tremors/numbness/tingling/dizziness Psychiatric: no depression/anxiety  PE: BP 134/73 mmHg  Pulse 61  Ht 5\' 8"  (1.727 m)  Wt 221 lb (100.245 kg)  BMI 33.61 kg/m2  SpO2 99% Wt Readings from Last 3 Encounters:  12/14/15 221 lb (100.245 kg)  10/19/15 221 lb (100.245 kg)  08/29/15 221 lb (100.245 kg)    Constitutional: overweight, in NAD Eyes: PERRLA, EOMI, no exophthalmos ENT: moist mucous membranes, left lobe thyroid nodule is palpable, no  cervical lymphadenopathy Cardiovascular: RRR, No MRG Respiratory: CTA B Gastrointestinal: abdomen soft, NT, ND, BS+ Musculoskeletal: no deformities, strength intact in all 4;  Skin: moist, warm, no rashes Neurological: no tremor with outstretched hands, DTR normal in all 4  ASSESSMENT: 1. MNG 2. Subclinical hypothyroidism  PLAN: 1. MNG  -She is status post fine needle aspiration which was reported to be benign follicular adenoma-[name]. -She will not need intervention for this at this point. -She may need periodic surveillance with repeat ultrasound if necessary.  -She is responding to levothyroxine 50 g by mouth every morning. I advised her to continue on the same.   - We discussed about correct intake of levothyroxine, at fasting, with water, separated by at least 30 minutes from breakfast, and separated by more than 4 hours from calcium, iron, multivitamins, acid reflux medications (PPIs). -Patient is made aware of the fact that thyroid hormone replacement is needed for life, dose to be adjusted by periodic monitoring of thyroid function tests.  I advised her to maintain close follow up with her primary care physician for her primary care needs. -She will return in 6 months with repeat thyroid function test. Glade Lloyd, MD  Tel. [phone], Fax [phone]

## 2015-12-19 ENCOUNTER — Other Ambulatory Visit: Payer: Self-pay | Admitting: "Endocrinology

## 2015-12-20 NOTE — Progress Notes (Signed)
This encounter was created in error - please disregard.

## 2016-01-30 LAB — HEMOGLOBIN A1C
Hgb A1c MFr Bld: 5.8 % — ABNORMAL HIGH (ref <5.7)
Mean Plasma Glucose: 120 mg/dL

## 2016-01-31 LAB — COMPREHENSIVE METABOLIC PANEL
ALT: 15 U/L (ref 6–29)
AST: 16 U/L (ref 10–35)
Albumin: 3.7 g/dL (ref 3.6–5.1)
Alkaline Phosphatase: 62 U/L (ref 33–130)
BUN: 15 mg/dL (ref 7–25)
CALCIUM: 9.2 mg/dL (ref 8.6–10.4)
CHLORIDE: 105 mmol/L (ref 98–110)
CO2: 26 mmol/L (ref 20–31)
Creat: 0.72 mg/dL (ref 0.60–0.88)
GLUCOSE: 92 mg/dL (ref 65–99)
POTASSIUM: 4.2 mmol/L (ref 3.5–5.3)
Sodium: 139 mmol/L (ref 135–146)
Total Bilirubin: 0.3 mg/dL (ref 0.2–1.2)
Total Protein: 6.3 g/dL (ref 6.1–8.1)

## 2016-01-31 LAB — LIPID PANEL
CHOL/HDL RATIO: 2.7 ratio (ref <=5.0)
CHOLESTEROL: 157 mg/dL (ref 125–200)
HDL: 59 mg/dL (ref >=46)
LDL Cholesterol: 87 mg/dL (ref <130)
Triglycerides: 57 mg/dL (ref <150)
VLDL: 11 mg/dL (ref <30)

## 2016-02-01 ENCOUNTER — Ambulatory Visit (INDEPENDENT_AMBULATORY_CARE_PROVIDER_SITE_OTHER): Payer: Medicare Other | Admitting: Family Medicine

## 2016-02-01 ENCOUNTER — Encounter: Payer: Self-pay | Admitting: Family Medicine

## 2016-02-01 VITALS — BP 120/78 | HR 69 | Resp 16 | Ht 68.0 in | Wt 220.0 lb

## 2016-02-01 DIAGNOSIS — R7303 Prediabetes: Secondary | ICD-10-CM

## 2016-02-01 DIAGNOSIS — Z1211 Encounter for screening for malignant neoplasm of colon: Secondary | ICD-10-CM

## 2016-02-01 DIAGNOSIS — I10 Essential (primary) hypertension: Secondary | ICD-10-CM

## 2016-02-01 DIAGNOSIS — E559 Vitamin D deficiency, unspecified: Secondary | ICD-10-CM

## 2016-02-01 DIAGNOSIS — Z0000 Encounter for general adult medical examination without abnormal findings: Secondary | ICD-10-CM

## 2016-02-01 DIAGNOSIS — E785 Hyperlipidemia, unspecified: Secondary | ICD-10-CM

## 2016-02-01 LAB — POC HEMOCCULT BLD/STL (OFFICE/1-CARD/DIAGNOSTIC): FECAL OCCULT BLD: NEGATIVE

## 2016-02-01 NOTE — Progress Notes (Signed)
   Subjective:    Patient ID: Tammy Galvan, female    DOB: April 21, 1933, 82 y.o.   MRN: [id number]  HPI Patient is in for annual physical exam. No other health concerns are expressed or addressed at the visit. Recent labs, if available are reviewed. Immunization is reviewed , and  updated if needed.   Review of Systems    See HPI  Objective:   Physical Exam BP 120/78 mmHg  Pulse 69  Resp 16  Ht 5\' 8"  (1.727 m)  Wt 220 lb (99.791 kg)  BMI 33.46 kg/m2  SpO2 96%   Pleasant well nourished female, alert and oriented x 3, in no cardio-pulmonary distress. Afebrile. HEENT No facial trauma or asymetry. Sinuses non tender.  Extra occullar muscles intact, pupils equally reactive to light. External ears normal, tympanic membranes clear. Oropharynx moist, no exudate, fairly  good dentition. Neck: supple, no adenopathy,JVD or thyromegaly.No bruits.  Chest: Clear to ascultation bilaterally.No crackles or wheezes. Non tender to palpation  Breast: No asymetry,no masses or lumps. No tenderness. No nipple discharge or inversion. No axillary or supraclavicular adenopathy  Cardiovascular system; Heart sounds normal,  S1 and  S2 ,no S3.  No murmur, or thrill. Apical beat not displaced Peripheral pulses normal.  Abdomen: Soft, non tender, no organomegaly or masses. No bruits. Bowel sounds normal. No guarding, tenderness or rebound.  Rectal:  Normal sphincter tone. No mass.No rectal masses.  Guaiac negative stool.  GU: External genitalia normal female genitalia , female distribution of hair. No lesions. Urethral meatus normal in size, no  Prolapse, no lesions visibly  Present. Bladder non tender. Vagina pink and moist , with no visible lesions , discharge present . Adequate pelvic support no  cystocele or rectocele noted  Internal exam not done Musculoskeletal exam: Decreased , though adequate  ROM of spine, hips , shoulders and knees. No deformity ,swelling or crepitus  noted. No muscle wasting or atrophy.   Neurologic: Cranial nerves 2 to 12 intact. Power, tone ,sensation and reflexes normal throughout. No disturbance in gait. No tremor.  Skin: Intact, no ulceration, erythema , scaling or rash noted.vitiligo of vulva Pigmentation normal throughout  Psych; Normal mood and affect. Judgement and concentration normal        Assessment & Plan:  Encounter for annual physical exam Annual exam as documented. Counseling done  re healthy lifestyle involving commitment to 150 minutes exercise per week, heart healthy diet, and attaining healthy weight.The importance of adequate sleep also discussed. . Immunization and cancer screening needs are specifically addressed at this visit.

## 2016-02-01 NOTE — Patient Instructions (Signed)
Annual wellness 2nd week in October, call if you need me sooner  CONGRATS, excellent labs, sugar is almost normal  Fasting lipid, cmp and eGFr, HBA1C, CBC and Vit D first week in October  Thank you  for choosing [facility]. We consider it a privelige to serve you.  Delivering excellent health care in a caring and  compassionate way is our goal.  Partnering with you,  so that together we can achieve this goal is our strategy.

## 2016-02-01 NOTE — Assessment & Plan Note (Signed)
Annual exam as documented. Counseling done  re healthy lifestyle involving commitment to 150 minutes exercise per week, heart healthy diet, and attaining healthy weight.The importance of adequate sleep also discussed.  Immunization and cancer screening needs are specifically addressed at this visit.  

## 2016-03-08 ENCOUNTER — Other Ambulatory Visit: Payer: Self-pay | Admitting: Family Medicine

## 2016-03-25 ENCOUNTER — Other Ambulatory Visit: Payer: Self-pay | Admitting: Family Medicine

## 2016-05-22 ENCOUNTER — Telehealth: Payer: Self-pay | Admitting: "Endocrinology

## 2016-05-22 NOTE — Telephone Encounter (Signed)
Patient states that her levothyroxine is making her itch.on her legs. Someone told her that they had the same thing happen when they took the same medication. She denies having rashes or bumps, it just itches all of the time. She wants to know if she needs to switch to something else?

## 2016-05-22 NOTE — Telephone Encounter (Signed)
It is about time for her to see me with repeat labs. We will discuss her options when she returns for visit.

## 2016-06-12 ENCOUNTER — Other Ambulatory Visit: Payer: Self-pay | Admitting: "Endocrinology

## 2016-06-12 LAB — T4, FREE: FREE T4: 1.2 ng/dL (ref 0.8–1.8)

## 2016-06-12 LAB — TSH: TSH: 2.4 mIU/L

## 2016-06-13 ENCOUNTER — Other Ambulatory Visit: Payer: Self-pay | Admitting: Family Medicine

## 2016-06-17 ENCOUNTER — Encounter: Payer: Self-pay | Admitting: "Endocrinology

## 2016-06-17 ENCOUNTER — Ambulatory Visit (INDEPENDENT_AMBULATORY_CARE_PROVIDER_SITE_OTHER): Payer: Medicare Other | Admitting: "Endocrinology

## 2016-06-17 VITALS — BP 137/72 | HR 70 | Ht 68.0 in | Wt 218.0 lb

## 2016-06-17 DIAGNOSIS — E038 Other specified hypothyroidism: Secondary | ICD-10-CM | POA: Diagnosis not present

## 2016-06-17 DIAGNOSIS — E042 Nontoxic multinodular goiter: Secondary | ICD-10-CM | POA: Diagnosis not present

## 2016-06-17 MED ORDER — LEVOTHYROXINE SODIUM 50 MCG PO TABS: ORAL_TABLET | ORAL | 6 refills | Status: DC

## 2016-06-17 NOTE — Progress Notes (Signed)
HPI  Tammy Galvan is a 82 y.o.-year-old female, here to follow-up for hypothyroidism on levothyroxine.   -She was seen for multinodular goiter, status post fine needle aspiration which showed benign follicular adenoma, [name].  Thyroid U/S: From 06/20/2015 was reviewed. It showed right lobe 4.1 cm with a 0.3 cm hypoechoic solid nodule on the superior aspect. Left lobe was measuring 4.2 cm with 2 x 1 x 1.4 cm mixed echogenic partially cystic predominantly solid nodule on the superior aspect. Isthmus measures 0.3 cm.    -She is on levothyroxine 50 g by mouth every morning., Complies very well. She feels better. She c/o bilateral lower ext itching. ROS: Constitutional: +progressive weight gain/loss, no fatigue, no subjective hyperthermia/hypothermia Eyes: no blurry vision, no xerophthalmia ENT: no sore throat, no nodules palpated in throat, no dysphagia/odynophagia, no hoarseness Cardiovascular: no CP/SOB/palpitations/leg swelling Respiratory: no cough/SOB Gastrointestinal: no N/V/D/C Musculoskeletal: no muscle/joint aches Skin: no rashes Neurological: no tremors/numbness/tingling/dizziness Psychiatric: no depression/anxiety  PE: BP 137/72   Pulse 70   Ht 5\' 8"  (1.727 m)   Wt 218 lb (98.9 kg)   BMI 33.15 kg/m  Wt Readings from Last 3 Encounters:  06/17/16 218 lb (98.9 kg)  02/01/16 220 lb (99.8 kg)  12/14/15 221 lb (100.2 kg)    Constitutional: overweight, in NAD Eyes: PERRLA, EOMI, no exophthalmos ENT: moist mucous membranes, left lobe thyroid nodule is palpable, no cervical lymphadenopathy Cardiovascular: RRR, No MRG Respiratory: CTA B Gastrointestinal: abdomen soft, NT, ND, BS+ Musculoskeletal: no deformities, strength intact in all 4;  Skin: moist, warm, she has stasis dermatitis on BLE Neurological: no tremor with outstretched hands, DTR normal in all 4  Recent Results (from the past 2160 hour(s))  TSH     Status: None   Collection Time: 06/12/16 10:24  AM  Result Value Ref Range   TSH 2.40 mIU/L    Comment:   Reference Range   > or = 20 Years  0.40-4.50   Pregnancy Range First trimester  0.26-2.66 Second trimester 0.55-2.73 Third trimester  0.43-2.91     T4, free     Status: None   Collection Time: 06/12/16 10:24 AM  Result Value Ref Range   Free T4 1.2 0.8 - 1.8 ng/dL    ASSESSMENT: 1. MNG 2. hypothyroidism  PLAN: 1. MNG  -She is status post fine needle aspiration which was reported to be benign follicular adenoma-[name]. -She will not need intervention for this at this point. -She may need periodic surveillance with repeat ultrasound if necessary.  -She is responding to levothyroxine 50 g by mouth every morning. I advised her to continue on the same.   - We discussed about correct intake of levothyroxine, at fasting, with water, separated by at least 30 minutes from breakfast, and separated by more than 4 hours from calcium, iron, multivitamins, acid reflux medications (PPIs). -Patient is made aware of the fact that thyroid hormone replacement is needed for life, dose to be adjusted by periodic monitoring of thyroid function tests.  - I advised her To get compression stockings to help with stasis dermatitis on bilateral lower extremities.  I advised her to maintain close follow up with her primary care physician for her primary care needs. -She will return in 6 months with repeat thyroid function test. Glade Lloyd, MD  Tel. [phone], Fax [phone]

## 2016-06-20 ENCOUNTER — Telehealth: Payer: Self-pay | Admitting: "Endocrinology

## 2016-06-20 ENCOUNTER — Other Ambulatory Visit: Payer: Self-pay

## 2016-06-20 MED ORDER — LEVOTHYROXINE SODIUM 50 MCG PO TABS: ORAL_TABLET | ORAL | 6 refills | Status: DC

## 2016-06-20 NOTE — Telephone Encounter (Signed)
Needs refill on levothyroxine please

## 2016-06-21 ENCOUNTER — Other Ambulatory Visit: Payer: Self-pay | Admitting: "Endocrinology

## 2016-07-17 ENCOUNTER — Telehealth: Payer: Self-pay

## 2016-07-17 DIAGNOSIS — I10 Essential (primary) hypertension: Secondary | ICD-10-CM

## 2016-07-17 DIAGNOSIS — E559 Vitamin D deficiency, unspecified: Secondary | ICD-10-CM

## 2016-07-17 DIAGNOSIS — R7303 Prediabetes: Secondary | ICD-10-CM

## 2016-07-17 DIAGNOSIS — Z0000 Encounter for general adult medical examination without abnormal findings: Secondary | ICD-10-CM

## 2016-07-17 LAB — LIPID PANEL
CHOL/HDL RATIO: 3 ratio (ref <=5.0)
CHOLESTEROL: 160 mg/dL (ref 125–200)
HDL: 54 mg/dL (ref >=46)
LDL Cholesterol: 92 mg/dL (ref <130)
Triglycerides: 71 mg/dL (ref <150)
VLDL: 14 mg/dL (ref <30)

## 2016-07-17 LAB — CBC
HCT: 43.9 % (ref 35.0–45.0)
Hemoglobin: 13.8 g/dL (ref 11.7–15.5)
MCH: 26.2 pg — ABNORMAL LOW (ref 27.0–33.0)
MCHC: 31.4 g/dL — ABNORMAL LOW (ref 32.0–36.0)
MCV: 83.3 fL (ref 80.0–100.0)
MPV: 10.1 fL (ref 7.5–12.5)
Platelets: 301 10*3/uL (ref 140–400)
RBC: 5.27 MIL/uL — ABNORMAL HIGH (ref 3.80–5.10)
RDW: 15.1 % — ABNORMAL HIGH (ref 11.0–15.0)
WBC: 6.1 10*3/uL (ref 3.8–10.8)

## 2016-07-17 LAB — COMPLETE METABOLIC PANEL WITH GFR
ALBUMIN: 3.7 g/dL (ref 3.6–5.1)
ALK PHOS: 60 U/L (ref 33–130)
ALT: 12 U/L (ref 6–29)
AST: 17 U/L (ref 10–35)
BUN: 13 mg/dL (ref 7–25)
CALCIUM: 8.9 mg/dL (ref 8.6–10.4)
CO2: 28 mmol/L (ref 20–31)
Chloride: 105 mmol/L (ref 98–110)
Creat: 0.8 mg/dL (ref 0.60–0.88)
GFR, EST AFRICAN AMERICAN: 81 mL/min (ref >=60)
GFR, EST NON AFRICAN AMERICAN: 70 mL/min (ref >=60)
Glucose, Bld: 86 mg/dL (ref 65–99)
POTASSIUM: 4.2 mmol/L (ref 3.5–5.3)
SODIUM: 140 mmol/L (ref 135–146)
Total Bilirubin: 0.5 mg/dL (ref 0.2–1.2)
Total Protein: 6.3 g/dL (ref 6.1–8.1)

## 2016-07-18 ENCOUNTER — Encounter: Payer: Self-pay | Admitting: Family Medicine

## 2016-07-18 ENCOUNTER — Other Ambulatory Visit: Payer: Self-pay | Admitting: "Endocrinology

## 2016-07-18 LAB — HEMOGLOBIN A1C
HEMOGLOBIN A1C: 5.6 % (ref <5.7)
MEAN PLASMA GLUCOSE: 114 mg/dL

## 2016-07-18 LAB — VITAMIN D 25 HYDROXY (VIT D DEFICIENCY, FRACTURES): VIT D 25 HYDROXY: 38 ng/mL (ref 30–100)

## 2016-07-18 MED ORDER — SYNTHROID 50 MCG PO TABS: 50.0000 ug | ORAL_TABLET | Freq: Every day | ORAL | 5 refills | Status: DC

## 2016-07-18 NOTE — Telephone Encounter (Signed)
We can change it to Synthroid brand. I wills end an rx.

## 2016-07-19 NOTE — Telephone Encounter (Signed)
Patient aware.

## 2016-07-19 NOTE — Progress Notes (Signed)
Patient aware. Thank you for changing it

## 2016-07-23 ENCOUNTER — Encounter: Payer: Medicare Other | Admitting: Family Medicine

## 2016-07-23 ENCOUNTER — Ambulatory Visit (INDEPENDENT_AMBULATORY_CARE_PROVIDER_SITE_OTHER): Payer: Medicare Other

## 2016-07-23 VITALS — BP 106/70 | HR 64 | Resp 18 | Ht 68.0 in | Wt 223.0 lb

## 2016-07-23 DIAGNOSIS — Z0000 Encounter for general adult medical examination without abnormal findings: Secondary | ICD-10-CM | POA: Diagnosis not present

## 2016-07-23 NOTE — Patient Instructions (Addendum)
  Thank you for choosing [facility] for your health care needs  The Annual Wellness Visit is designed to allow [place] the chance to assist you in preserving and improving you health.   Dr. Moshe Cipro will see you back in 5 months  Any needed labs will be mailed to you with the date to have them drawn  Please return to get your flu shot or go to your local pharmacy  If you have any concerns please don't hesitate to call.  The new # is [phone]    Hypothyroidism Hypothyroidism is a disorder of the thyroid. The thyroid is a large gland that is located in the lower front of the neck. The thyroid releases hormones that control how the body works. With hypothyroidism, the thyroid does not make enough of these hormones. CAUSES Causes of hypothyroidism may include:  Viral infections.  Pregnancy.  Your own defense system (immune system) attacking your thyroid.  Certain medicines.  Birth defects.  Past radiation treatments to your head or neck.  Past treatment with radioactive iodine.  Past surgical removal of part or all of your thyroid.  Problems with the gland that is located in the center of your brain (pituitary). SIGNS AND SYMPTOMS Signs and symptoms of hypothyroidism may include:  Feeling as though you have no energy (lethargy).  Inability to tolerate cold.  Weight gain that is not explained by a change in diet or exercise habits.  Dry skin.  Coarse hair.  Menstrual irregularity.  Slowing of thought processes.  Constipation.  Sadness or depression. DIAGNOSIS  Your health care provider may diagnose hypothyroidism with blood tests and ultrasound tests. TREATMENT Hypothyroidism is treated with medicine that replaces the hormones that your body does not make. After you begin treatment, it may take several weeks for symptoms to go away. HOME CARE INSTRUCTIONS   Take medicines only as directed by your health care provider.  If you start taking any new  medicines, tell your health care provider.  Keep all follow-up visits as directed by your health care provider. This is important. As your condition improves, your dosage needs may change. You will need to have blood tests regularly so that your health care provider can watch your condition. SEEK MEDICAL CARE IF:  Your symptoms do not get better with treatment.  You are taking thyroid replacement medicine and:  You sweat excessively.  You have tremors.  You feel anxious.  You lose weight rapidly.  You cannot tolerate heat.  You have emotional swings.  You have diarrhea.  You feel weak. SEEK IMMEDIATE MEDICAL CARE IF:   You develop chest pain.  You develop an irregular heartbeat.  You develop a rapid heartbeat.   This information is not intended to replace advice given to you by your health care provider. Make sure you discuss any questions you have with your health care provider.   Document Released: 10/07/2005 Document Revised: 10/28/2014 Document Reviewed: 02/22/2014 Elsevier Interactive Patient Education [employer].

## 2016-07-23 NOTE — Progress Notes (Signed)
Subjective:    Tammy Galvan is a 82 y.o. female who presents for Medicare Annual/Subsequent preventive examination.  Preventive Screening-Counseling & Management  Tobacco History  Smoking Status  . Never Smoker  Smokeless Tobacco  . Never Used     Current Problems (verified) Patient Active Problem List   Diagnosis Date Noted  . Encounter for annual physical exam 02/01/2016  . Knee pain, acute 10/19/2015  . Vitamin D deficiency 10/19/2015  . Need for prophylactic vaccination and inoculation against influenza 10/19/2015  . Other specified hypothyroidism 08/29/2015  . Nontoxic multinodular goiter 08/15/2015  . Medicare annual wellness visit, subsequent 06/15/2015  . Abnormal thyroid stimulating hormone (TSH) level 06/15/2015  . Osteoarthritis of both knees 11/07/2013  . Back pain with radiation 11/04/2013  . Prediabetes 08/31/2011  . Hyperlipidemia LDL goal <100 08/31/2009  . Obesity 10/01/2008  . GLAUCOMA 01/27/2008  . Essential hypertension 01/27/2008  . DEGENERATIVE JOINT DISEASE, SPINE 01/27/2008    Medications Prior to Visit Current Outpatient Prescriptions on File Prior to Visit  Medication Sig Dispense Refill  . amLODipine (NORVASC) 10 MG tablet TAKE 1 TABLET EVERY DAY 90 tablet 1  . aspirin 325 MG tablet Take 325 mg by mouth daily.      . brimonidine-timolol (COMBIGAN) 0.2-0.5 % ophthalmic solution Place 1 drop into both eyes 2 (two) times daily.    . ergocalciferol (VITAMIN D2) 50000 units capsule Take 1 capsule (50,000 Units total) by mouth once a week. One capsule once weekly 4 capsule 11  . hydrochlorothiazide (HYDRODIURIL) 25 MG tablet TAKE 1 TABLET BY MOUTH EVERY DAY 90 tablet 3  . KLOR-CON M20 20 MEQ tablet TAKE 1 TABLET (20 MEQ TOTAL) BY MOUTH DAILY. 90 tablet 1  . metoprolol succinate (TOPROL-XL) 25 MG 24 hr tablet TAKE 1 TABLET EVERY DAY 90 tablet 1  . pravastatin (PRAVACHOL) 10 MG tablet TAKE 1 TABLET (10 MG TOTAL) BY MOUTH DAILY. 90 tablet 1  .  SYNTHROID 50 MCG tablet Take 1 tablet (50 mcg total) by mouth daily before breakfast. 30 tablet 5   No current facility-administered medications on file prior to visit.     Current Medications (verified) Current Outpatient Prescriptions  Medication Sig Dispense Refill  . amLODipine (NORVASC) 10 MG tablet TAKE 1 TABLET EVERY DAY 90 tablet 1  . aspirin 325 MG tablet Take 325 mg by mouth daily.      . brimonidine-timolol (COMBIGAN) 0.2-0.5 % ophthalmic solution Place 1 drop into both eyes 2 (two) times daily.    . ergocalciferol (VITAMIN D2) 50000 units capsule Take 1 capsule (50,000 Units total) by mouth once a week. One capsule once weekly 4 capsule 11  . hydrochlorothiazide (HYDRODIURIL) 25 MG tablet TAKE 1 TABLET BY MOUTH EVERY DAY 90 tablet 3  . KLOR-CON M20 20 MEQ tablet TAKE 1 TABLET (20 MEQ TOTAL) BY MOUTH DAILY. 90 tablet 1  . metoprolol succinate (TOPROL-XL) 25 MG 24 hr tablet TAKE 1 TABLET EVERY DAY 90 tablet 1  . pravastatin (PRAVACHOL) 10 MG tablet TAKE 1 TABLET (10 MG TOTAL) BY MOUTH DAILY. 90 tablet 1  . SYNTHROID 50 MCG tablet Take 1 tablet (50 mcg total) by mouth daily before breakfast. 30 tablet 5   No current facility-administered medications for this visit.      Allergies (verified) Ace inhibitors   PAST HISTORY  Family History Family History  Problem Relation Age of Onset  . Arthritis Mother   . Diabetes Father   . Heart disease Father   .  Stroke Sister   . Diabetes Sister   . Kidney disease Brother     on dialysis    Social History Social History  Substance Use Topics  . Smoking status: Never Smoker  . Smokeless tobacco: Never Used  . Alcohol use No     Are there smokers in your home (other than you)? No  Risk Factors Current exercise habits: Exercise is limited by orthopedic condition(s): knee instability.  Dietary issues discussed: Food choices that will improve cholesterol and upcoming changes with food labels   Cardiac risk factors: advanced  age (older than 60 for men, 44 for women), dyslipidemia, hypertension, obesity (BMI >= 30 kg/m2) and sedentary lifestyle.  Depression Screen (Note: if answer to either of the following is "Yes", a more complete depression screening is indicated)   Over the past two weeks, have you felt down, depressed or hopeless? No  Over the past two weeks, have you felt little interest or pleasure in doing things? No  Have you lost interest or pleasure in daily life? No  Do you often feel hopeless? No  Do you cry easily over simple problems? No  Activities of Daily Living In your present state of health, do you have any difficulty performing the following activities?:  Driving? No Managing money?  No Feeding yourself? No Getting from bed to chair? NoNo exam performed today, annual wellness without physical exam. Climbing a flight of stairs? Yes due to knee pain but is able to perform if needed  Preparing food and eating?: No Bathing or showering? No Getting dressed: No Getting to the toilet? No Using the toilet:No Moving around from place to place: Yes In the past year have you fallen or had a near fall?:No   Are you sexually active?  No  Do you have more than one partner?  N/a  Hearing Difficulties: No Do you often ask people to speak up or repeat themselves? No Do you experience ringing or noises in your ears? No Do you have difficulty understanding soft or whispered voices? No   Do you feel that you have a problem with memory? No  Do you often misplace items? No  Do you feel safe at home?  Yes  Cognitive Testing  Alert? Yes  Normal Appearance?Yes  Oriented to person? Yes  Place? Yes   Time? Yes  Recall of three objects?  Yes  Can perform simple calculations? Yes  Displays appropriate judgment?Yes  Can read the correct time from a watch face?Yes   Advanced Directives have been discussed with the patient? Yes  List the Names of Other Physician/Practitioners you currently use: 1.   Dr. Dorris Fetch (endocrinology)   Indicate any recent Medical Services you may have received from other than [facility] providers in the past year (date may be approximate).  Immunization History  Administered Date(s) Administered  . Influenza Split 08/30/2011, 07/08/2012  . Influenza Whole 08/05/2007, 06/26/2010  . Influenza,inj,Quad PF,36+ Mos 07/15/2013, 06/14/2014, 10/19/2015  . Pneumococcal Conjugate-13 06/15/2015  . Pneumococcal Polysaccharide-23 04/03/2009  . Td 06/18/2007    Screening Tests Health Maintenance  Topic Date Due  . INFLUENZA VACCINE  09/19/2016 (Originally 05/21/2016)  . ZOSTAVAX  07/03/2017 (Originally 10/02/1995)  . TETANUS/TDAP  06/17/2017  . DEXA SCAN  Completed  . PNA vac Low Risk Adult  Completed    All answers were reviewed with the patient and necessary referrals were made:  Vanetta Mulders, LPN   [id number]   History reviewed: allergies, current medications, past family history, past  medical history, past social history, past surgical history and problem list  Review of Systems A comprehensive review of systems was negative.    Objective:     Vision by Snellen chart: right eye:20/30, left eye:20/30  Body mass index is 33.91 kg/m. BP 106/70   Pulse 64   Resp 18   Ht 5\' 8"  (1.727 m)   Wt 223 lb 0.6 oz (101.2 kg)   SpO2 100%   BMI 33.91 kg/m   No exam performed today, annual wellness without physical exam.     Assessment:     Plan:     During the course of the visit the patient was educated and counseled about appropriate screening and preventive services including:    Influenza vaccine  Nutrition counseling   Diet review for nutrition referral? Yes ____  Not Indicated __x__   Patient Instructions (the written plan) was given to the patient.  Medicare Attestation I have personally reviewed: The patient's medical and social history Their use of alcohol, tobacco or illicit drugs Their current medications and  supplements The patient's functional ability including ADLs,fall risks, home safety risks, cognitive, and hearing and visual impairment Diet and physical activities Evidence for depression or mood disorders  The patient's weight, height, BMI, and visual acuity have been recorded in the chart.  I have made referrals, counseling, and provided education to the patient based on review of the above and I have provided the patient with a written personalized care plan for preventive services.     Denman George [place], [place]   [id number]

## 2016-08-07 ENCOUNTER — Telehealth: Payer: Self-pay

## 2016-08-07 NOTE — Telephone Encounter (Signed)
pls sche work in with me asap

## 2016-08-08 ENCOUNTER — Ambulatory Visit (HOSPITAL_COMMUNITY)
Admission: RE | Admit: 2016-08-08 | Discharge: 2016-08-08 | Disposition: A | Discharge status: Home / Self Care | Payer: Medicare Other | Source: Ambulatory Visit | Attending: Family Medicine | Admitting: Family Medicine

## 2016-08-08 ENCOUNTER — Encounter: Payer: Self-pay | Admitting: Family Medicine

## 2016-08-08 ENCOUNTER — Ambulatory Visit (INDEPENDENT_AMBULATORY_CARE_PROVIDER_SITE_OTHER): Payer: Medicare Other | Admitting: Family Medicine

## 2016-08-08 VITALS — BP 104/68 | HR 62 | Resp 18 | Ht 68.0 in | Wt 210.0 lb

## 2016-08-08 DIAGNOSIS — Z23 Encounter for immunization: Secondary | ICD-10-CM | POA: Diagnosis not present

## 2016-08-08 DIAGNOSIS — L03116 Cellulitis of left lower limb: Secondary | ICD-10-CM | POA: Diagnosis not present

## 2016-08-08 DIAGNOSIS — I10 Essential (primary) hypertension: Secondary | ICD-10-CM

## 2016-08-08 DIAGNOSIS — M7989 Other specified soft tissue disorders: Secondary | ICD-10-CM | POA: Insufficient documentation

## 2016-08-08 DIAGNOSIS — L0390 Cellulitis, unspecified: Secondary | ICD-10-CM | POA: Insufficient documentation

## 2016-08-08 DIAGNOSIS — B351 Tinea unguium: Secondary | ICD-10-CM | POA: Diagnosis not present

## 2016-08-08 MED ORDER — TERBINAFINE HCL 250 MG PO TABS: 250.0000 mg | ORAL_TABLET | Freq: Every day | ORAL | 1 refills | Status: DC

## 2016-08-08 MED ORDER — CEPHALEXIN 500 MG PO CAPS: 500.0000 mg | ORAL_CAPSULE | Freq: Three times a day (TID) | ORAL | 0 refills | Status: DC

## 2016-08-08 MED ORDER — HYDROXYZINE HCL 10 MG PO TABS: ORAL_TABLET | ORAL | 0 refills | Status: DC

## 2016-08-08 NOTE — Telephone Encounter (Signed)
Attempted to reach patient on both #s listed.  Left message on cell #

## 2016-08-08 NOTE — Patient Instructions (Addendum)
F/u i6 weeks , call if you need me sooner  You are treated for cellultis of left leg, need a study to ensure no blockage in left leg today  Flu vaccine today

## 2016-08-08 NOTE — Telephone Encounter (Signed)
Spoke with patient and she will come in today at 1

## 2016-08-25 NOTE — Progress Notes (Signed)
   CATARA STACHE     MRN: [id number]      DOB: Sep 15, 1933   HPI [name] is here with c/o swelling, pain and tingling in left leg, all of which she attributes to synthroid medication Also notes scaly rash, and redness of leg. Denies cough or dyspnea or hemoptysis, no h/o DVT   ROS Denies recent fever or chills. Denies sinus pressure, nasal congestion, ear pain or sore throat. Denies chest congestion, productive cough or wheezing. Denies chest pains, palpitations , PND or orthopnea Denies abdominal pain, nausea, vomiting,diarrhea or constipation.   Denies dysuria, frequency, hesitancy or incontinence.  Denies headaches, seizures, numbness, or tingling. Denies depression, anxiety or insomnia.    PE  BP 104/68   Pulse 62   Resp 18   Ht 5\' 8"  (1.727 m)   Wt 210 lb (95.3 kg)   SpO2 98%   BMI 31.93 kg/m   Patient alert and oriented and in no cardiopulmonary distress.  HEENT: No facial asymmetry, EOMI,   oropharynx pink and moist.  Neck supple no JVD, no mass.  Chest: Clear to auscultation bilaterally.  CVS: S1, S2 no murmurs, no S3.Regular rate.  ABD: Soft non tender.   [id number] , warmth , tenderness of left calf, Homann's negative  MS: Adequate though reduced  ROM spine,  hips and knees.  Skin:erythema and warmth noted on skin of left leg, fungal rash noted also  Psych: Good eye contact, normal affect. Memory intact not anxious or depressed appearing.  CNS: CN 2-12 intact, power,  normal throughout.no focal deficits noted.   Assessment & Plan  Left leg swelling Acute swelling with warmth and tender ness of left leg, need eval for DVT  Cellulitis Antibiotic course prescribed to address issue  Need for prophylactic vaccination and inoculation against influenza After obtaining informed consent, the vaccine is  administered by LPN.   Onychomycosis Oral antifungal treatment prescribed  Essential hypertension Controlled, no change in medication

## 2016-08-25 NOTE — Assessment & Plan Note (Signed)
After obtaining informed consent, the vaccine is  administered by LPN.  

## 2016-08-25 NOTE — Assessment & Plan Note (Signed)
Oral antifungal treatment prescribed

## 2016-08-25 NOTE — Assessment & Plan Note (Signed)
Acute swelling with warmth and tender ness of left leg, need eval for DVT

## 2016-08-25 NOTE — Assessment & Plan Note (Signed)
Controlled, no change in medication  

## 2016-08-25 NOTE — Assessment & Plan Note (Signed)
Antibiotic course prescribed to address issue

## 2016-09-14 ENCOUNTER — Other Ambulatory Visit: Payer: Self-pay | Admitting: Family Medicine

## 2016-09-15 ENCOUNTER — Other Ambulatory Visit: Payer: Self-pay | Admitting: Family Medicine

## 2016-09-19 ENCOUNTER — Encounter: Payer: Self-pay | Admitting: Family Medicine

## 2016-09-19 ENCOUNTER — Ambulatory Visit (INDEPENDENT_AMBULATORY_CARE_PROVIDER_SITE_OTHER): Payer: Medicare Other | Admitting: Family Medicine

## 2016-09-19 VITALS — BP 128/78 | HR 57 | Resp 16 | Ht 68.0 in | Wt 217.0 lb

## 2016-09-19 DIAGNOSIS — Z1231 Encounter for screening mammogram for malignant neoplasm of breast: Secondary | ICD-10-CM

## 2016-09-19 DIAGNOSIS — IMO0001 Reserved for inherently not codable concepts without codable children: Secondary | ICD-10-CM

## 2016-09-19 DIAGNOSIS — I10 Essential (primary) hypertension: Secondary | ICD-10-CM | POA: Diagnosis not present

## 2016-09-19 DIAGNOSIS — E785 Hyperlipidemia, unspecified: Secondary | ICD-10-CM | POA: Diagnosis not present

## 2016-09-19 DIAGNOSIS — E559 Vitamin D deficiency, unspecified: Secondary | ICD-10-CM

## 2016-09-19 DIAGNOSIS — R7303 Prediabetes: Secondary | ICD-10-CM | POA: Diagnosis not present

## 2016-09-19 DIAGNOSIS — E6609 Other obesity due to excess calories: Secondary | ICD-10-CM

## 2016-09-19 NOTE — Patient Instructions (Addendum)
Annual physical exam end April, call if you need me sooner  Fasting lipid, cmp and EGFR, Vit D, hBA1C  In March when getting lab for Dr Nida You need mammogram we will refer and schedule  Thankful skin better  Please work on good  health habits so that your health will improve. 1. Commitment to daily physical activity for 30 to 60  minutes, if you are able to do this.  2. Commitment to wise food choices. Aim for half of your  food intake to be vegetable and fruit, one quarter starchy foods, and one quarter protein. Try to eat on a regular schedule  3 meals per day, snacking between meals should be limited to vegetables or fruits or small portions of nuts. 64 ounces of water per day is generally recommended, unless you have specific health conditions, like heart failure or kidney failure where you will need to limit fluid intake.  3. Commitment to sufficient and a  good quality of physical and mental rest daily, generally between 6 to 8 hours per day.  WITH PERSISTANCE AND PERSEVERANCE, THE IMPOSSIBLE , BECOMES THE NORM! It is important that you exercise regularly at least 30 minutes 5 times a week. If you develop chest pain, have severe difficulty breathing, or feel very tired, stop exercising immediately and seek medical attention   

## 2016-09-22 NOTE — Assessment & Plan Note (Signed)
Patient educated about the importance of limiting  Carbohydrate intake , the need to commit to daily physical activity for a minimum of 30 minutes , and to commit weight loss. The fact that changes in all these areas will reduce or eliminate all together the development of diabetes is stressed.  Updated lab needed at/ before next visit.   Diabetic Labs Latest Ref Rng & Units 07/17/2016 01/30/2016 06/06/2015 01/11/2015 06/09/2014  HbA1c <5.7 % 5.6 5.8(H) 6.0(H) 6.0(H) 6.2(H)  Chol 125 - 200 mg/dL 160 157 136 178 -  HDL >=46 mg/dL 54 59 48 50 -  Calc LDL <130 mg/dL 92 87 72 116(H) -  Triglycerides <150 mg/dL 71 57 81 60 -  Creatinine 0.60 - 0.88 mg/dL 0.80 0.72 0.76 0.79 0.80   BP/Weight 09/19/2016 08/08/2016 07/23/2016 06/17/2016 02/01/2016 12/14/2015 99991111  Systolic BP 0000000 123456 A999333 0000000 123456 [id number]  Diastolic BP 78 68 70 72 78 73 78  Wt. (Lbs) 217 210 223.04 218 220 221 221  BMI 32.99 31.93 33.91 33.15 33.46 33.61 33.61   No flowsheet data found.

## 2016-09-22 NOTE — Assessment & Plan Note (Signed)
Hyperlipidemia:Low fat diet discussed and encouraged.   Lipid Panel  Lab Results  Component Value Date   CHOL 160 07/17/2016   HDL 54 07/17/2016   LDLCALC 92 07/17/2016   TRIG 71 07/17/2016   CHOLHDL 3.0 07/17/2016   Controlled, no change in medication Updated lab needed at/ before next visit.

## 2016-09-22 NOTE — Assessment & Plan Note (Signed)
Controlled, no change in medication DASH diet and commitment to daily physical activity for a minimum of 30 minutes discussed and encouraged, as a part of hypertension management. The importance of attaining a healthy weight is also discussed.  BP/Weight 09/19/2016 08/08/2016 07/23/2016 06/17/2016 02/01/2016 12/14/2015 99991111  Systolic BP 0000000 123456 A999333 0000000 123456 Q000111Q [id number]  Diastolic BP 78 68 70 72 78 73 78  Wt. (Lbs) 217 210 223.04 218 220 221 221  BMI 32.99 31.93 33.91 33.15 33.46 33.61 33.61

## 2016-09-22 NOTE — Assessment & Plan Note (Signed)
Deteriorated. Patient re-educated about  the importance of commitment to a  minimum of 150 minutes of exercise per week.  The importance of healthy food choices with portion control discussed. Encouraged to start a food diary, count calories and to consider  joining a support group. Sample diet sheets offered. Goals set by the patient for the next several months.   Weight /BMI 09/19/2016 08/08/2016 07/23/2016  WEIGHT 217 lb 210 lb 223 lb 0.6 oz  HEIGHT 5\' 8"  5\' 8"  5\' 8"   BMI 32.99 kg/m2 31.93 kg/m2 33.91 kg/m2

## 2016-09-22 NOTE — Progress Notes (Signed)
Tammy Galvan     MRN: [id number]      DOB: April 12, 1933   HPI Tammy Galvan is here for follow up and re-evaluation of chronic medical conditions, medication management and review of any available recent lab and radiology data.  Preventive health is updated, specifically  Cancer screening and Immunization.   Questions or concerns regarding consultations or procedures which the PT has had in the interim are  addressed. The PT denies any adverse reactions to current medications since the last visit.  There are no new concerns.  Reports complete resolution of skin infection and swelling and denies adverse reaction to medication   ROS Denies recent fever or chills. Denies sinus pressure, nasal congestion, ear pain or sore throat. Denies chest congestion, productive cough or wheezing. Denies chest pains, palpitations and leg swelling Denies abdominal pain, nausea, vomiting,diarrhea or constipation.   Denies dysuria, frequency, hesitancy or incontinence. Denies joint pain, swelling and limitation in mobility. Denies headaches, seizures, numbness, or tingling. Denies depression, anxiety or insomnia. Denies skin break down or rash.   PE  BP 128/78   Pulse (!) 57   Resp 16   Ht 5\' 8"  (1.727 m)   Wt 217 lb (98.4 kg)   SpO2 98%   BMI 32.99 kg/m   Patient alert and oriented and in no cardiopulmonary distress.  HEENT: No facial asymmetry, EOMI,   oropharynx pink and moist.  Neck supple no JVD, no mass.  Chest: Clear to auscultation bilaterally.  CVS: S1, S2 no murmurs, no S3.Regular rate.  ABD: Soft non tender.   Ext: No edema  MS: Adequate ROM spine, shoulders, hips and knees.  Skin: Intact, no ulcerations or rash noted.  Psych: Good eye contact, normal affect. Memory intact not anxious or depressed appearing.  CNS: CN 2-12 intact, power,  normal throughout.no focal deficits noted.   Assessment & Plan  Essential hypertension Controlled, no change in medication DASH diet  and commitment to daily physical activity for a minimum of 30 minutes discussed and encouraged, as a part of hypertension management. The importance of attaining a healthy weight is also discussed.  BP/Weight 09/19/2016 08/08/2016 07/23/2016 06/17/2016 02/01/2016 12/14/2015 99991111  Systolic BP 0000000 123456 A999333 0000000 123456 Q000111Q [id number]  Diastolic BP 78 68 70 72 78 73 78  Wt. (Lbs) 217 210 223.04 218 220 221 221  BMI 32.99 31.93 33.91 33.15 33.46 33.61 33.61       Prediabetes Patient educated about the importance of limiting  Carbohydrate intake , the need to commit to daily physical activity for a minimum of 30 minutes , and to commit weight loss. The fact that changes in all these areas will reduce or eliminate all together the development of diabetes is stressed.  Updated lab needed at/ before next visit.   Diabetic Labs Latest Ref Rng & Units 07/17/2016 01/30/2016 06/06/2015 01/11/2015 06/09/2014  HbA1c <5.7 % 5.6 5.8(H) 6.0(H) 6.0(H) 6.2(H)  Chol 125 - 200 mg/dL 160 157 136 178 -  HDL >=46 mg/dL 54 59 48 50 -  Calc LDL <130 mg/dL 92 87 72 116(H) -  Triglycerides <150 mg/dL 71 57 81 60 -  Creatinine 0.60 - 0.88 mg/dL 0.80 0.72 0.76 0.79 0.80   BP/Weight 09/19/2016 08/08/2016 07/23/2016 06/17/2016 02/01/2016 12/14/2015 99991111  Systolic BP 0000000 123456 A999333 0000000 123456 Q000111Q [id number]  Diastolic BP 78 68 70 72 78 73 78  Wt. (Lbs) 217 210 223.04 218 220 221 221  BMI 32.99 31.93 33.91 33.15 33.46 33.61  33.61   No flowsheet data found.    Hyperlipidemia LDL goal <100 Hyperlipidemia:Low fat diet discussed and encouraged.   Lipid Panel  Lab Results  Component Value Date   CHOL 160 07/17/2016   HDL 54 07/17/2016   LDLCALC 92 07/17/2016   TRIG 71 07/17/2016   CHOLHDL 3.0 07/17/2016   Controlled, no change in medication Updated lab needed at/ before next visit.     Obesity Deteriorated. Patient re-educated about  the importance of commitment to a  minimum of 150 minutes of exercise per week.  The  importance of healthy food choices with portion control discussed. Encouraged to start a food diary, count calories and to consider  joining a support group. Sample diet sheets offered. Goals set by the patient for the next several months.   Weight /BMI 09/19/2016 08/08/2016 07/23/2016  WEIGHT 217 lb 210 lb 223 lb 0.6 oz  HEIGHT 5\' 8"  5\' 8"  5\' 8"   BMI 32.99 kg/m2 31.93 kg/m2 33.91 kg/m2

## 2016-12-10 ENCOUNTER — Other Ambulatory Visit: Payer: Self-pay | Admitting: Family Medicine

## 2016-12-16 ENCOUNTER — Other Ambulatory Visit: Payer: Self-pay | Admitting: "Endocrinology

## 2016-12-16 LAB — T4, FREE: FREE T4: 1.2 ng/dL (ref 0.8–1.8)

## 2016-12-16 LAB — TSH: TSH: 2.34 mIU/L

## 2016-12-19 ENCOUNTER — Encounter: Payer: Self-pay | Admitting: "Endocrinology

## 2016-12-19 ENCOUNTER — Ambulatory Visit (INDEPENDENT_AMBULATORY_CARE_PROVIDER_SITE_OTHER): Payer: Medicare Other | Admitting: "Endocrinology

## 2016-12-19 VITALS — BP 116/67 | HR 64 | Ht 68.0 in | Wt 217.0 lb

## 2016-12-19 DIAGNOSIS — E042 Nontoxic multinodular goiter: Secondary | ICD-10-CM

## 2016-12-19 DIAGNOSIS — E038 Other specified hypothyroidism: Secondary | ICD-10-CM

## 2016-12-19 MED ORDER — SYNTHROID 50 MCG PO TABS: 50.0000 ug | ORAL_TABLET | Freq: Every day | ORAL | 3 refills | Status: DC

## 2016-12-19 NOTE — Progress Notes (Signed)
HPI  Tammy Galvan is a 83 y.o.-year-old female, here to follow-up for hypothyroidism on Synthroid 50 g by mouth every morning. She is compliant.  -She was seen for multinodular goiter, status post fine needle aspiration which showed benign follicular adenoma, [name].  Thyroid U/S: From 06/20/2015 was reviewed. It showed right lobe 4.1 cm with a 0.3 cm hypoechoic solid nodule on the superior aspect. Left lobe was measuring 4.2 cm with 2 x 1 x 1.4 cm mixed echogenic partially cystic predominantly solid nodule on the superior aspect. Isthmus measures 0.3 cm.   She feels better.   ROS: Constitutional: + Gained 7 pounds since last visit, no fatigue, no subjective hyperthermia/hypothermia Eyes: no blurry vision, no xerophthalmia ENT: no sore throat, no nodules palpated in throat, no dysphagia/odynophagia, no hoarseness Cardiovascular: no CP/SOB/palpitations/leg swelling Respiratory: no cough/SOB Gastrointestinal: no N/V/D/C Musculoskeletal: no muscle/joint aches Skin: no rashes Neurological: no tremors/numbness/tingling/dizziness Psychiatric: no depression/anxiety  PE: BP 116/67   Pulse 64   Ht 5\' 8"  (1.727 m)   Wt 217 lb (98.4 kg)   BMI 32.99 kg/m  Wt Readings from Last 3 Encounters:  12/19/16 217 lb (98.4 kg)  09/19/16 217 lb (98.4 kg)  08/08/16 210 lb (95.3 kg)    Constitutional: overweight, in NAD Eyes: PERRLA, EOMI, no exophthalmos ENT: moist mucous membranes, left lobe thyroid nodule is palpable, no cervical lymphadenopathy Cardiovascular: RRR, No MRG Respiratory: CTA B Gastrointestinal: abdomen soft, NT, ND, BS+ Musculoskeletal: no deformities, strength intact in all 4;  Skin: moist, warm, she has stasis dermatitis on BLE Neurological: no tremor with outstretched hands, DTR normal in all 4  Recent Results (from the past 2160 hour(s))  TSH     Status: None   Collection Time: 12/16/16 11:40 AM  Result Value Ref Range   TSH 2.34 mIU/L    Comment:    Reference Range   > or = 20 Years  0.40-4.50   Pregnancy Range First trimester  0.26-2.66 Second trimester 0.55-2.73 Third trimester  0.43-2.91     T4, free     Status: None   Collection Time: 12/16/16 11:40 AM  Result Value Ref Range   Free T4 1.2 0.8 - 1.8 ng/dL    ASSESSMENT: 1. MNG 2. hypothyroidism  PLAN:  -She is responding to Synthroid 50 g by mouth every morning. I advised her to continue on the same.  - We discussed about correct intake of levothyroxine, at fasting, with water, separated by at least 30 minutes from breakfast, and separated by more than 4 hours from calcium, iron, multivitamins, acid reflux medications (PPIs). -Patient is made aware of the fact that thyroid hormone replacement is needed for life, dose to be adjusted by periodic monitoring of thyroid function tests.  -She is status post fine needle aspiration which was reported to be benign follicular adenoma-[name]. -She will not need intervention for this at this point. -She may need periodic surveillance with repeat ultrasound if necessary. - I advised her To get compression stockings to help with stasis dermatitis on bilateral lower extremities.  I advised her to maintain close follow up with her primary care physician for her primary care needs.  -She will return in 12 months with repeat thyroid function test. Glade Lloyd, MD  Tel. [phone], Fax [phone]

## 2016-12-24 ENCOUNTER — Ambulatory Visit (INDEPENDENT_AMBULATORY_CARE_PROVIDER_SITE_OTHER): Payer: Medicare Other | Admitting: Family Medicine

## 2016-12-24 ENCOUNTER — Encounter: Payer: Self-pay | Admitting: Family Medicine

## 2016-12-24 VITALS — BP 138/72 | HR 70 | Resp 15 | Ht 68.0 in | Wt 220.0 lb

## 2016-12-24 DIAGNOSIS — Z6833 Body mass index (BMI) 33.0-33.9, adult: Secondary | ICD-10-CM | POA: Diagnosis not present

## 2016-12-24 DIAGNOSIS — E038 Other specified hypothyroidism: Secondary | ICD-10-CM

## 2016-12-24 DIAGNOSIS — E785 Hyperlipidemia, unspecified: Secondary | ICD-10-CM | POA: Diagnosis not present

## 2016-12-24 DIAGNOSIS — E6609 Other obesity due to excess calories: Secondary | ICD-10-CM

## 2016-12-24 DIAGNOSIS — B369 Superficial mycosis, unspecified: Secondary | ICD-10-CM | POA: Diagnosis not present

## 2016-12-24 DIAGNOSIS — I10 Essential (primary) hypertension: Secondary | ICD-10-CM | POA: Diagnosis not present

## 2016-12-24 MED ORDER — TERBINAFINE HCL 250 MG PO TABS: 250.0000 mg | ORAL_TABLET | Freq: Every day | ORAL | 0 refills | Status: DC

## 2016-12-24 MED ORDER — PRAVASTATIN SODIUM 10 MG PO TABS: ORAL_TABLET | ORAL | 1 refills | Status: DC

## 2016-12-24 MED ORDER — CLOTRIMAZOLE-BETAMETHASONE 1-0.05 % EX CREA: TOPICAL_CREAM | CUTANEOUS | 1 refills | Status: DC

## 2016-12-24 NOTE — Patient Instructions (Addendum)
Annual physical exam as before, call if you need me sooner, pls sched wellness visit that same day  PLEASE schedule your mammogram , past due.  Please get labs 1 week before visit  Tablets and cream sent  For rash on neck   Please work on good  health habits so that your health will improve. 1. Commitment to daily physical activity for 30 to 60  minutes, if you are able to do this.  2. Commitment to wise food choices. Aim for half of your  food intake to be vegetable and fruit, one quarter starchy foods, and one quarter protein. Try to eat on a regular schedule  3 meals per day, snacking between meals should be limited to vegetables or fruits or small portions of nuts. 64 ounces of water per day is generally recommended, unless you have specific health conditions, like heart failure or kidney failure where you will need to limit fluid intake.  3. Commitment to sufficient and a  good quality of physical and mental rest daily, generally between 6 to 8 hours per day.  WITH PERSISTANCE AND PERSEVERANCE, THE IMPOSSIBLE , BECOMES THE NORM! Thank you  for choosing [facility]. We consider it a privelige to serve you.  Delivering excellent health care in a caring and  compassionate way is our goal.  Partnering with you,  so that together we can achieve this goal is our strategy.

## 2016-12-24 NOTE — Progress Notes (Signed)
Tammy Galvan     MRN: [id number]      DOB: 07-17-1933   HPI Tammy Galvan is here for follow up and re-evaluation of chronic medical conditions, medication management and review of any available recent lab and radiology data.  Preventive health is updated, specifically  Cancer screening and Immunization.   Questions or concerns regarding consultations or procedures which the PT has had in the interim are  addressed. The PT denies any adverse reactions to current medications since the last visit.  3 month h/o pruritic rash on back of neck which she feels is related to her meds ROS Denies recent fever or chills. Denies sinus pressure, nasal congestion, ear pain or sore throat. Denies chest congestion, productive cough or wheezing. Denies chest pains, palpitations and leg swelling Denies abdominal pain, nausea, vomiting,diarrhea or constipation.   Denies dysuria, frequency, hesitancy or incontinence. Denies uncontrolled  joint pain, swelling and limitation in mobility. Denies headaches, seizures, numbness, or tingling. Denies depression, anxiety or insomnia.    PE  BP 138/72   Pulse 70   Resp 15   Ht 5\' 8"  (1.727 m)   Wt 220 lb (99.8 kg)   SpO2 100%   BMI 33.45 kg/m   Patient alert and oriented and in no cardiopulmonary distress.  HEENT: No facial asymmetry, EOMI,   oropharynx pink and moist.  Neck supple no JVD, no mass.  Chest: Clear to auscultation bilaterally.  CVS: S1, S2 no murmurs, no S3.Regular rate.  ABD: Soft non tender.   Ext: No edema  MS: Adequate ROM spine, shoulders, hips and knees.  Skin: fungal rash at nape diameter approx 5 cm  Psych: Good eye contact, normal affect. Memory intact not anxious or depressed appearing.  CNS: CN 2-12 intact, power,  normal throughout.no focal deficits noted.   Assessment & Plan  Dermatomycosis Nape has active infection oral and topical medication prescribed  Essential hypertension Controlled, no change in  medication DASH diet and commitment to daily physical activity for a minimum of 30 minutes discussed and encouraged, as a part of hypertension management. The importance of attaining a healthy weight is also discussed.  BP/Weight 12/24/2016 12/19/2016 09/19/2016 08/08/2016 07/23/2016 06/17/2016 123456  Systolic BP 0000000 99991111 0000000 123456 A999333 0000000 123456  Diastolic BP 72 67 78 68 70 72 78  Wt. (Lbs) 220 217 217 210 223.04 218 220  BMI 33.45 32.99 32.99 31.93 33.91 33.15 33.46       Other specified hypothyroidism Treated by endo and controlled on current med  Hyperlipidemia LDL goal <100 Hyperlipidemia:Low fat diet discussed and encouraged.   Lipid Panel  Lab Results  Component Value Date   CHOL 160 07/17/2016   HDL 54 07/17/2016   LDLCALC 92 07/17/2016   TRIG 71 07/17/2016   CHOLHDL 3.0 07/17/2016     Controlled, no change in medication   Obesity Deteriorated. Patient re-educated about  the importance of commitment to a  minimum of 150 minutes of exercise per week.  The importance of healthy food choices with portion control discussed. Encouraged to start a food diary, count calories and to consider  joining a support group. Sample diet sheets offered. Goals set by the patient for the next several months.   Weight /BMI 12/24/2016 12/19/2016 09/19/2016  WEIGHT 220 lb 217 lb 217 lb  HEIGHT 5\' 8"  5\' 8"  5\' 8"   BMI 33.45 kg/m2 32.99 kg/m2 32.99 kg/m2      Prediabetes Patient educated about the importance of limiting  Carbohydrate  intake , the need to commit to daily physical activity for a minimum of 30 minutes , and to commit weight loss. The fact that changes in all these areas will reduce or eliminate all together the development of diabetes is stressed.   Diabetic Labs Latest Ref Rng & Units 07/17/2016 01/30/2016 06/06/2015 01/11/2015 06/09/2014  HbA1c <5.7 % 5.6 5.8(H) 6.0(H) 6.0(H) 6.2(H)  Chol 125 - 200 mg/dL 160 157 136 178 -  HDL >=46 mg/dL 54 59 48 50 -  Calc LDL <130 mg/dL 92  87 72 116(H) -  Triglycerides <150 mg/dL 71 57 81 60 -  Creatinine 0.60 - 0.88 mg/dL 0.80 0.72 0.76 0.79 0.80   BP/Weight 12/24/2016 12/19/2016 09/19/2016 08/08/2016 07/23/2016 06/17/2016 123456  Systolic BP 0000000 99991111 0000000 123456 A999333 0000000 123456  Diastolic BP 72 67 78 68 70 72 78  Wt. (Lbs) 220 217 217 210 223.04 218 220  BMI 33.45 32.99 32.99 31.93 33.91 33.15 33.46   No flowsheet data found.

## 2016-12-26 NOTE — Assessment & Plan Note (Signed)
Treated by endo and controlled on current med

## 2016-12-26 NOTE — Assessment & Plan Note (Signed)
Patient educated about the importance of limiting  Carbohydrate intake , the need to commit to daily physical activity for a minimum of 30 minutes , and to commit weight loss. The fact that changes in all these areas will reduce or eliminate all together the development of diabetes is stressed.   Diabetic Labs Latest Ref Rng & Units 07/17/2016 01/30/2016 06/06/2015 01/11/2015 06/09/2014  HbA1c <5.7 % 5.6 5.8(H) 6.0(H) 6.0(H) 6.2(H)  Chol 125 - 200 mg/dL 160 157 136 178 -  HDL >=46 mg/dL 54 59 48 50 -  Calc LDL <130 mg/dL 92 87 72 116(H) -  Triglycerides <150 mg/dL 71 57 81 60 -  Creatinine 0.60 - 0.88 mg/dL 0.80 0.72 0.76 0.79 0.80   BP/Weight 12/24/2016 12/19/2016 09/19/2016 08/08/2016 07/23/2016 06/17/2016 3/64/6803  Systolic BP 212 248 250 037 048 889 169  Diastolic BP 72 67 78 68 70 72 78  Wt. (Lbs) 220 217 217 210 223.04 218 220  BMI 33.45 32.99 32.99 31.93 33.91 33.15 33.46   No flowsheet data found.

## 2016-12-26 NOTE — Assessment & Plan Note (Signed)
Hyperlipidemia:Low fat diet discussed and encouraged.   Lipid Panel  Lab Results  Component Value Date   CHOL 160 07/17/2016   HDL 54 07/17/2016   LDLCALC 92 07/17/2016   TRIG 71 07/17/2016   CHOLHDL 3.0 07/17/2016     Controlled, no change in medication

## 2016-12-26 NOTE — Assessment & Plan Note (Signed)
Controlled, no change in medication DASH diet and commitment to daily physical activity for a minimum of 30 minutes discussed and encouraged, as a part of hypertension management. The importance of attaining a healthy weight is also discussed.  BP/Weight 12/24/2016 12/19/2016 09/19/2016 08/08/2016 07/23/2016 06/17/2016 7/54/3606  Systolic BP 770 340 352 481 859 093 112  Diastolic BP 72 67 78 68 70 72 78  Wt. (Lbs) 220 217 217 210 223.04 218 220  BMI 33.45 32.99 32.99 31.93 33.91 33.15 33.46

## 2016-12-26 NOTE — Assessment & Plan Note (Signed)
Nape has active infection oral and topical medication prescribed

## 2016-12-26 NOTE — Assessment & Plan Note (Signed)
Deteriorated. Patient re-educated about  the importance of commitment to a  minimum of 150 minutes of exercise per week.  The importance of healthy food choices with portion control discussed. Encouraged to start a food diary, count calories and to consider  joining a support group. Sample diet sheets offered. Goals set by the patient for the next several months.   Weight /BMI 12/24/2016 12/19/2016 09/19/2016  WEIGHT 220 lb 217 lb 217 lb  HEIGHT 5\' 8"  5\' 8"  5\' 8"   BMI 33.45 kg/m2 32.99 kg/m2 32.99 kg/m2

## 2017-01-07 DIAGNOSIS — Z961 Presence of intraocular lens: Secondary | ICD-10-CM | POA: Insufficient documentation

## 2017-01-07 DIAGNOSIS — H401131 Primary open-angle glaucoma, bilateral, mild stage: Secondary | ICD-10-CM | POA: Insufficient documentation

## 2017-01-17 ENCOUNTER — Other Ambulatory Visit: Payer: Self-pay | Admitting: Family Medicine

## 2017-01-17 DIAGNOSIS — Z1231 Encounter for screening mammogram for malignant neoplasm of breast: Secondary | ICD-10-CM

## 2017-01-22 ENCOUNTER — Ambulatory Visit (HOSPITAL_COMMUNITY)
Admission: RE | Admit: 2017-01-22 | Discharge: 2017-01-22 | Disposition: A | Discharge status: Home / Self Care | Payer: Medicare Other | Source: Ambulatory Visit | Attending: Family Medicine | Admitting: Family Medicine

## 2017-01-22 DIAGNOSIS — Z1231 Encounter for screening mammogram for malignant neoplasm of breast: Secondary | ICD-10-CM | POA: Insufficient documentation

## 2017-01-23 ENCOUNTER — Encounter: Payer: Self-pay | Admitting: Family Medicine

## 2017-01-23 ENCOUNTER — Ambulatory Visit (INDEPENDENT_AMBULATORY_CARE_PROVIDER_SITE_OTHER): Payer: Medicare Other | Admitting: Family Medicine

## 2017-01-23 ENCOUNTER — Ambulatory Visit (HOSPITAL_COMMUNITY)
Admission: RE | Admit: 2017-01-23 | Discharge: 2017-01-23 | Disposition: A | Discharge status: Home / Self Care | Payer: Medicare Other | Source: Ambulatory Visit | Attending: Family Medicine | Admitting: Family Medicine

## 2017-01-23 VITALS — BP 134/78 | HR 60 | Temp 97.5°F | Resp 18 | Ht 68.0 in | Wt 221.0 lb

## 2017-01-23 DIAGNOSIS — M79672 Pain in left foot: Secondary | ICD-10-CM

## 2017-01-23 DIAGNOSIS — M7732 Calcaneal spur, left foot: Secondary | ICD-10-CM | POA: Diagnosis not present

## 2017-01-23 DIAGNOSIS — R946 Abnormal results of thyroid function studies: Secondary | ICD-10-CM

## 2017-01-23 DIAGNOSIS — I10 Essential (primary) hypertension: Secondary | ICD-10-CM

## 2017-01-23 DIAGNOSIS — R7989 Other specified abnormal findings of blood chemistry: Secondary | ICD-10-CM

## 2017-01-23 DIAGNOSIS — M722 Plantar fascial fibromatosis: Secondary | ICD-10-CM | POA: Insufficient documentation

## 2017-01-23 DIAGNOSIS — M549 Dorsalgia, unspecified: Secondary | ICD-10-CM

## 2017-01-23 DIAGNOSIS — E559 Vitamin D deficiency, unspecified: Secondary | ICD-10-CM | POA: Diagnosis not present

## 2017-01-23 DIAGNOSIS — E785 Hyperlipidemia, unspecified: Secondary | ICD-10-CM

## 2017-01-23 MED ORDER — INDOMETHACIN 25 MG PO CAPS: ORAL_CAPSULE | ORAL | 0 refills | Status: DC

## 2017-01-23 MED ORDER — RANITIDINE HCL 150 MG PO CAPS: 150.0000 mg | ORAL_CAPSULE | Freq: Two times a day (BID) | ORAL | 0 refills | Status: DC

## 2017-01-23 MED ORDER — PREDNISONE 5 MG PO TABS: ORAL_TABLET | ORAL | 0 refills | Status: DC

## 2017-01-23 NOTE — Patient Instructions (Signed)
f/u as before, call if you need me sooner  If not MUCH improved by Monday call , I will refer you to podiatrist   You are treated today for acute left ankle pain and plantar fascitis  THREE medications are sent to your pharmacy, indomethacin, prednisone and zantc  Fasting lipid, cmp , uric acid level and vitamin D by next week Monday please  X ray of left ankle today  NO weight bearing on left foot/ reduced weight bearing for next 5 days so it will get better   Thank you  for choosing [facility]. We consider it a privelige to serve you.  Delivering excellent health care in a caring and  compassionate way is our goal.  Partnering with you,  so that together we can achieve this goal is our strategy.

## 2017-01-26 NOTE — Assessment & Plan Note (Signed)
Hyperlipidemia:Low fat diet discussed and encouraged.   Lipid Panel  Lab Results  Component Value Date   CHOL 160 07/17/2016   HDL 54 07/17/2016   LDLCALC 92 07/17/2016   TRIG 71 07/17/2016   CHOLHDL 3.0 07/17/2016    Updated lab needed at/ before next visit.  \

## 2017-01-26 NOTE — Assessment & Plan Note (Signed)
Controlled, no change in medication DASH diet and commitment to daily physical activity for a minimum of 30 minutes discussed and encouraged, as a part of hypertension management. The importance of attaining a healthy weight is also discussed.  BP/Weight 01/23/2017 12/24/2016 12/19/2016 09/19/2016 08/08/2016 07/23/2016 8/92/1194  Systolic BP 174 081 448 185 631 497 026  Diastolic BP 78 72 67 78 68 70 72  Wt. (Lbs) 221 220 217 217 210 223.04 218  BMI 33.6 33.45 32.99 32.99 31.93 33.91 33.15

## 2017-01-26 NOTE — Assessment & Plan Note (Signed)
Current flare causing bilateral foot pain, initially right foot 1 week ago, now left , short anti inflammatory course

## 2017-01-26 NOTE — Assessment & Plan Note (Signed)
Acute onset , short sharp course of anti inflammatory

## 2017-01-26 NOTE — Assessment & Plan Note (Signed)
No trauma, however will obtain imaging study, suspect possible gout will check uric acid level Short sharp course of anti inflammatory

## 2017-01-26 NOTE — Assessment & Plan Note (Signed)
Managed by endo ans controlled

## 2017-01-26 NOTE — Progress Notes (Signed)
   Tammy Galvan     MRN: [id number]      DOB: 18-Apr-1933   HPI Tammy Galvan is here with a 1 day h/o acute left foot pain preventing her from being able to weight bear, also ankle pain, no h/o recent trauma. States that 1  Week ago she was experiencing low back, right buttock and lower extremity pain to right foot, this lessened and now has left foot symptoms  ROS Denies recent fever or chills. Denies sinus pressure, nasal congestion, ear pain or sore throat. Denies chest congestion, productive cough or wheezing. Denies chest pains, palpitations and leg swelling Denies abdominal pain, nausea, vomiting,diarrhea or constipation.   Denies dysuria, frequency, hesitancy or incontinence.  Denies headaches, seizures, numbness, or tingling. Denies depression, anxiety or insomnia. Denies skin break down or rash.   PE  BP 134/78 (BP Location: Left Arm, Patient Position: Sitting, Cuff Size: Normal)   Pulse 60   Temp 97.5 F (36.4 C) (Temporal)   Resp 18   Ht 5\' 8"  (1.727 m)   Wt 221 lb (100.2 kg)   SpO2 98%   BMI 33.60 kg/m   Pt in apin and unable to weight bear on left foot without  Use of a cane, using 2!  Patient alert and oriented and in no cardiopulmonary distress.  HEENT: No facial asymmetry, EOMI,   oropharynx pink and moist.  Neck supple no JVD, no mass.  Chest: Clear to auscultation bilaterally.  CVS: S1, S2 no murmurs, no S3.Regular rate.  ABD: Soft non tender.   Ext: No edema  MS: Decreased  ROM lumbar spine, normal in  shoulders, hips and knees. Decreased ROM left ankle with tenderness and warmth and deformity, tender to palpation over left instep  Skin: Intact, no ulcerations or rash noted.  Psych: Good eye contact, normal affect. Memory intact not anxious or depressed appearing.  CNS: CN 2-12 intact, power,  normal throughout.no focal deficits noted.   Assessment & Plan  Plantar fasciitis, left Acute onset , short sharp course of anti inflammatory  Acute  foot pain, left No trauma, however will obtain imaging study, suspect possible gout will check uric acid level Short sharp course of anti inflammatory  Back pain with radiation Current flare causing bilateral foot pain, initially right foot 1 week ago, now left , short anti inflammatory course  Essential hypertension Controlled, no change in medication DASH diet and commitment to daily physical activity for a minimum of 30 minutes discussed and encouraged, as a part of hypertension management. The importance of attaining a healthy weight is also discussed.  BP/Weight 01/23/2017 12/24/2016 12/19/2016 09/19/2016 08/08/2016 07/23/2016 01/27/8118  Systolic BP 147 829 562 130 865 784 696  Diastolic BP 78 72 67 78 68 70 72  Wt. (Lbs) 221 220 217 217 210 223.04 218  BMI 33.6 33.45 32.99 32.99 31.93 33.91 33.15       Hyperlipidemia LDL goal <100 Hyperlipidemia:Low fat diet discussed and encouraged.   Lipid Panel  Lab Results  Component Value Date   CHOL 160 07/17/2016   HDL 54 07/17/2016   LDLCALC 92 07/17/2016   TRIG 71 07/17/2016   CHOLHDL 3.0 07/17/2016    Updated lab needed at/ before next visit.  \  Abnormal thyroid stimulating hormone (TSH) level Managed by endo ans controlled

## 2017-01-28 ENCOUNTER — Other Ambulatory Visit: Payer: Self-pay | Admitting: Family Medicine

## 2017-01-28 LAB — COMPLETE METABOLIC PANEL WITH GFR
ALT: 11 U/L (ref 6–29)
AST: 14 U/L (ref 10–35)
Albumin: 3.4 g/dL — ABNORMAL LOW (ref 3.6–5.1)
Alkaline Phosphatase: 58 U/L (ref 33–130)
BUN: 17 mg/dL (ref 7–25)
CALCIUM: 8.9 mg/dL (ref 8.6–10.4)
CO2: 29 mmol/L (ref 20–31)
Chloride: 108 mmol/L (ref 98–110)
Creat: 0.79 mg/dL (ref 0.60–0.88)
GFR, Est African American: 81 mL/min (ref >=60)
GFR, Est Non African American: 70 mL/min (ref >=60)
Glucose, Bld: 75 mg/dL (ref 65–99)
POTASSIUM: 3.9 mmol/L (ref 3.5–5.3)
Sodium: 142 mmol/L (ref 135–146)
TOTAL PROTEIN: 6 g/dL — AB (ref 6.1–8.1)
Total Bilirubin: 0.4 mg/dL (ref 0.2–1.2)

## 2017-01-28 LAB — LIPID PANEL
CHOLESTEROL: 143 mg/dL (ref <200)
HDL: 61 mg/dL (ref >50)
LDL CALC: 73 mg/dL (ref <100)
TRIGLYCERIDES: 47 mg/dL (ref <150)
Total CHOL/HDL Ratio: 2.3 Ratio (ref <5.0)
VLDL: 9 mg/dL (ref <30)

## 2017-01-28 LAB — VITAMIN D 25 HYDROXY (VIT D DEFICIENCY, FRACTURES): Vit D, 25-Hydroxy: 26 ng/mL — ABNORMAL LOW (ref 30–100)

## 2017-01-28 LAB — URIC ACID: URIC ACID, SERUM: 5.7 mg/dL (ref 2.5–7.0)

## 2017-02-08 ENCOUNTER — Other Ambulatory Visit: Payer: Self-pay | Admitting: "Endocrinology

## 2017-02-17 ENCOUNTER — Other Ambulatory Visit (HOSPITAL_COMMUNITY)
Admission: RE | Admit: 2017-02-17 | Discharge: 2017-02-17 | Disposition: A | Discharge status: Home / Self Care | Payer: Medicare Other | Source: Ambulatory Visit | Attending: Family Medicine | Admitting: Family Medicine

## 2017-02-17 ENCOUNTER — Ambulatory Visit (INDEPENDENT_AMBULATORY_CARE_PROVIDER_SITE_OTHER): Payer: Medicare Other

## 2017-02-17 ENCOUNTER — Ambulatory Visit (INDEPENDENT_AMBULATORY_CARE_PROVIDER_SITE_OTHER): Payer: Medicare Other | Admitting: Family Medicine

## 2017-02-17 ENCOUNTER — Encounter: Payer: Self-pay | Admitting: Family Medicine

## 2017-02-17 VITALS — BP 130/70 | HR 61 | Resp 16 | Ht 68.0 in | Wt 220.0 lb

## 2017-02-17 VITALS — BP 130/70 | HR 61 | Temp 98.0°F | Ht 66.5 in | Wt 220.0 lb

## 2017-02-17 DIAGNOSIS — Z124 Encounter for screening for malignant neoplasm of cervix: Secondary | ICD-10-CM

## 2017-02-17 DIAGNOSIS — Z1211 Encounter for screening for malignant neoplasm of colon: Secondary | ICD-10-CM

## 2017-02-17 DIAGNOSIS — Z0000 Encounter for general adult medical examination without abnormal findings: Secondary | ICD-10-CM

## 2017-02-17 LAB — POC HEMOCCULT BLD/STL (OFFICE/1-CARD/DIAGNOSTIC): Fecal Occult Blood, POC: NEGATIVE

## 2017-02-17 NOTE — Assessment & Plan Note (Signed)
Annual exam as documented. . Immunization and cancer screening needs are specifically addressed at this visit.  

## 2017-02-17 NOTE — Progress Notes (Signed)
Subjective:   Tammy Galvan is a 83 y.o. female who presents for Medicare Annual (Subsequent) preventive examination.  Review of Systems:  Cardiac Risk Factors include: advanced age (>40men, >1 women);dyslipidemia;hypertension;obesity (BMI >30kg/m2)     Objective:     Vitals: BP 130/70   Pulse 61   Temp 98 F (36.7 C) (Oral)   Ht 5' 6.5" (1.689 m)   Wt 220 lb (99.8 kg)   SpO2 97%   BMI 34.98 kg/m   Body mass index is 34.98 kg/m.   Tobacco History  Smoking Status  . Never Smoker  Smokeless Tobacco  . Never Used     Counseling given: Not Answered   Past Medical History:  Diagnosis Date  . Glaucoma 2010  . Hyperlipidemia   . Hypertension   . Obesity   . Prediabetes 2013  . Thyroid disease   . TIA (transient ischemic attack)    Past Surgical History:  Procedure Laterality Date  . ABDOMINAL HYSTERECTOMY  1986  . CATARACT EXTRACTION    . COLONOSCOPY  07/01/2012   Procedure: COLONOSCOPY;  Surgeon: Rogene Houston, MD;  Location: [facility];  Service: Endoscopy;  Laterality: N/A;  830  . EYE SURGERY      bilateral cataract exrtaction  . FRACTURE SURGERY     right ankle and left ankle    Family History  Problem Relation Age of Onset  . Arthritis Mother   . Diabetes Father   . Heart disease Father   . Stroke Sister   . Diabetes Sister   . Pulmonary embolism Sister   . Kidney disease Brother     on dialysis  . Kidney failure Brother    History  Sexual Activity  . Sexual activity: No    Outpatient Encounter Prescriptions as of 02/17/2017  Medication Sig  . amLODipine (NORVASC) 10 MG tablet TAKE 1 TABLET EVERY DAY  . aspirin 325 MG tablet Take 325 mg by mouth daily.    . brimonidine-timolol (COMBIGAN) 0.2-0.5 % ophthalmic solution Place 1 drop into both eyes 2 (two) times daily.  . hydrochlorothiazide (HYDRODIURIL) 25 MG tablet TAKE 1 TABLET BY MOUTH EVERY DAY  . KLOR-CON M20 20 MEQ tablet TAKE 1 TABLET (20 MEQ TOTAL) BY MOUTH DAILY.  . metoprolol  succinate (TOPROL-XL) 25 MG 24 hr tablet TAKE 1 TABLET EVERY DAY  . pravastatin (PRAVACHOL) 10 MG tablet TAKE 1 TABLET (10 MG TOTAL) BY MOUTH DAILY.  . ranitidine (ZANTAC) 150 MG capsule Take 1 capsule (150 mg total) by mouth 2 (two) times daily.  Marland Kitchen SYNTHROID 50 MCG tablet Take 1 tablet (50 mcg total) by mouth daily before breakfast.  . terbinafine (LAMISIL) 250 MG tablet Take 1 tablet (250 mg total) by mouth daily.  . [DISCONTINUED] clotrimazole-betamethasone (LOTRISONE) cream Apply twice daily to rash on neck for 2 weeks, then as needed   No facility-administered encounter medications on file as of 02/17/2017.     Activities of Daily Living In your present state of health, do you have any difficulty performing the following activities: 02/17/2017 01/23/2017  Hearing? Y N  Vision? N N  Difficulty concentrating or making decisions? N N  Walking or climbing stairs? Y N  Dressing or bathing? N N  Doing errands, shopping? N N  Preparing Food and eating ? N -  Using the Toilet? N -  In the past six months, have you accidently leaked urine? N -  Do you have problems with loss of bowel control? N -  Managing your Medications? N -  Managing your Finances? N -  Housekeeping or managing your Housekeeping? N -  Some recent data might be hidden    Patient Care Team: Fayrene Helper, MD as PCP - General    Assessment:    Exercise Activities and Dietary recommendations Current Exercise Habits: Home exercise routine, Type of exercise: Other - see comments (bowling, currently participating in senior games), Time (Minutes): 60, Frequency (Times/Week): 2, Weekly Exercise (Minutes/Week): 120, Intensity: Mild, Exercise limited by: None identified  Goals    . Have 3 meals a day          Recommend eating 3 balanced meals a day and cutting back on sweets.      Fall Risk Fall Risk  02/17/2017 01/23/2017 12/19/2016 06/17/2016 02/01/2016  Falls in the past year? No No No No No  Risk for fall due to : -  - - - -   Depression Screen PHQ 2/9 Scores 02/17/2017 01/23/2017 12/19/2016 06/17/2016  PHQ - 2 Score 0 0 0 0     Cognitive Function: Normal   6CIT Screen 02/17/2017  What Year? 0 points  What month? 0 points  What time? 0 points  Count back from 20 0 points  Months in reverse 0 points  Repeat phrase 0 points  Total Score 0    Immunization History  Administered Date(s) Administered  . Influenza Split 08/30/2011, 07/08/2012  . Influenza Whole 08/05/2007, 06/26/2010  . Influenza,inj,Quad PF,36+ Mos 07/15/2013, 06/14/2014, 10/19/2015, 08/08/2016  . Pneumococcal Conjugate-13 06/15/2015  . Pneumococcal Polysaccharide-23 04/03/2009  . Td 06/18/2007   Screening Tests Health Maintenance  Topic Date Due  . INFLUENZA VACCINE  05/21/2017  . TETANUS/TDAP  06/17/2017  . DEXA SCAN  Completed  . PNA vac Low Risk Adult  Completed      Plan:   I have personally reviewed and noted the following in the patient's chart:   . Medical and social history . Use of alcohol, tobacco or illicit drugs  . Current medications and supplements . Functional ability and status . Nutritional status . Physical activity . Advanced directives . List of other physicians . Hospitalizations, surgeries, and ER visits in previous 12 months . Vitals . Screenings to include cognitive, depression, and falls . Referrals and appointments: Patient will call and schedule an appointment for a hearing screen at [facility]. She will call our office if she needs hearing aids and needs assistance paying for them.  In addition, I have reviewed and discussed with patient certain preventive protocols, quality metrics, and best practice recommendations. A written personalized care plan for preventive services as well as general preventive health recommendations were provided to patient.     Stormy Fabian, LPN  1/61/0960

## 2017-02-17 NOTE — Patient Instructions (Addendum)
Tammy Galvan , Thank you for taking time to come for your Medicare Wellness Visit. I appreciate your ongoing commitment to your health goals. Please review the following plan we discussed and let me know if I can assist you in the future.   Screening recommendations/referrals: Colonoscopy: Up to date and is no longer required Mammogram: Up to date, next due 01/2018 Bone Density: Up to date Recommended yearly ophthalmology/optometry visit for glaucoma screening and checkup Recommended yearly dental visit for hygiene and checkup  Vaccinations: Influenza vaccine: Up to date, next due 05/2017 Pneumococcal vaccine: Up to date Tdap vaccine: Up to date, next due 05/2017 Shingles vaccine: Due, declined    Advanced directives: Advance directive discussed with you today. I have provided a copy for you to complete at home and have notarized. Once this is complete please bring a copy in to our office so we can scan it into your chart.  Conditions/risks identified: Obese, recommend eating 3 balanced meals a day and cutting back on sweets.   Next appointment: Follow up in 1 year for your annual wellness visit.   Preventive Care 31 Years and Older, Female Preventive care refers to lifestyle choices and visits with your health care provider that can promote health and wellness. What does preventive care include?  A yearly physical exam. This is also called an annual well check.  Dental exams once or twice a year.  Routine eye exams. Ask your health care provider how often you should have your eyes checked.  Personal lifestyle choices, including:  Daily care of your teeth and gums.  Regular physical activity.  Eating a healthy diet.  Avoiding tobacco and drug use.  Limiting alcohol use.  Practicing safe sex.  Taking low-dose aspirin every day.  Taking vitamin and mineral supplements as recommended by your health care provider. What happens during an annual well check? The services and  screenings done by your health care provider during your annual well check will depend on your age, overall health, lifestyle risk factors, and family history of disease. Counseling  Your health care provider may ask you questions about your:  Alcohol use.  Tobacco use.  Drug use.  Emotional well-being.  Home and relationship well-being.  Sexual activity.  Eating habits.  History of falls.  Memory and ability to understand (cognition).  Work and work Statistician.  Reproductive health. Screening  You may have the following tests or measurements:  Height, weight, and BMI.  Blood pressure.  Lipid and cholesterol levels. These may be checked every 5 years, or more frequently if you are over 58 years old.  Skin check.  Lung cancer screening. You may have this screening every year starting at age 50 if you have a 30-pack-year history of smoking and currently smoke or have quit within the past 15 years.  Fecal occult blood test (FOBT) of the stool. You may have this test every year starting at age 53.  Flexible sigmoidoscopy or colonoscopy. You may have a sigmoidoscopy every 5 years or a colonoscopy every 10 years starting at age 56.  Hepatitis C blood test.  Hepatitis B blood test.  Sexually transmitted disease (STD) testing.  Diabetes screening. This is done by checking your blood sugar (glucose) after you have not eaten for a while (fasting). You may have this done every 1-3 years.  Bone density scan. This is done to screen for osteoporosis. You may have this done starting at age 79.  Mammogram. This may be done every 1-2 years. Talk  to your health care provider about how often you should have regular mammograms. Talk with your health care provider about your test results, treatment options, and if necessary, the need for more tests. Vaccines  Your health care provider may recommend certain vaccines, such as:  Influenza vaccine. This is recommended every  year.  Tetanus, diphtheria, and acellular pertussis (Tdap, Td) vaccine. You may need a Td booster every 10 years.  Zoster vaccine. You may need this after age 21.  Pneumococcal 13-valent conjugate (PCV13) vaccine. One dose is recommended after age 69.  Pneumococcal polysaccharide (PPSV23) vaccine. One dose is recommended after age 12. Talk to your health care provider about which screenings and vaccines you need and how often you need them. This information is not intended to replace advice given to you by your health care provider. Make sure you discuss any questions you have with your health care provider. Document Released: 11/03/2015 Document Revised: 06/26/2016 Document Reviewed: 08/08/2015 Elsevier Interactive Patient Education  2017 [employer] Prevention in the Home Falls can cause injuries. They can happen to people of all ages. There are many things you can do to make your home safe and to help prevent falls. What can I do on the outside of my home?  Regularly fix the edges of walkways and driveways and fix any cracks.  Remove anything that might make you trip as you walk through a door, such as a raised step or threshold.  Trim any bushes or trees on the path to your home.  Use bright outdoor lighting.  Clear any walking paths of anything that might make someone trip, such as rocks or tools.  Regularly check to see if handrails are loose or broken. Make sure that both sides of any steps have handrails.  Any raised decks and porches should have guardrails on the edges.  Have any leaves, snow, or ice cleared regularly.  Use sand or salt on walking paths during winter.  Clean up any spills in your garage right away. This includes oil or grease spills. What can I do in the bathroom?  Use night lights.  Install grab bars by the toilet and in the tub and shower. Do not use towel bars as grab bars.  Use non-skid mats or decals in the tub or shower.  If you  need to sit down in the shower, use a plastic, non-slip stool.  Keep the floor dry. Clean up any water that spills on the floor as soon as it happens.  Remove soap buildup in the tub or shower regularly.  Attach bath mats securely with double-sided non-slip rug tape.  Do not have throw rugs and other things on the floor that can make you trip. What can I do in the bedroom?  Use night lights.  Make sure that you have a light by your bed that is easy to reach.  Do not use any sheets or blankets that are too big for your bed. They should not hang down onto the floor.  Have a firm chair that has side arms. You can use this for support while you get dressed.  Do not have throw rugs and other things on the floor that can make you trip. What can I do in the kitchen?  Clean up any spills right away.  Avoid walking on wet floors.  Keep items that you use a lot in easy-to-reach places.  If you need to reach something above you, use a strong step stool that  has a grab bar.  Keep electrical cords out of the way.  Do not use floor polish or wax that makes floors slippery. If you must use wax, use non-skid floor wax.  Do not have throw rugs and other things on the floor that can make you trip. What can I do with my stairs?  Do not leave any items on the stairs.  Make sure that there are handrails on both sides of the stairs and use them. Fix handrails that are broken or loose. Make sure that handrails are as long as the stairways.  Check any carpeting to make sure that it is firmly attached to the stairs. Fix any carpet that is loose or worn.  Avoid having throw rugs at the top or bottom of the stairs. If you do have throw rugs, attach them to the floor with carpet tape.  Make sure that you have a light switch at the top of the stairs and the bottom of the stairs. If you do not have them, ask someone to add them for you. What else can I do to help prevent falls?  Wear shoes  that:  Do not have high heels.  Have rubber bottoms.  Are comfortable and fit you well.  Are closed at the toe. Do not wear sandals.  If you use a stepladder:  Make sure that it is fully opened. Do not climb a closed stepladder.  Make sure that both sides of the stepladder are locked into place.  Ask someone to hold it for you, if possible.  Clearly mark and make sure that you can see:  Any grab bars or handrails.  First and last steps.  Where the edge of each step is.  Use tools that help you move around (mobility aids) if they are needed. These include:  Canes.  Walkers.  Scooters.  Crutches.  Turn on the lights when you go into a dark area. Replace any light bulbs as soon as they burn out.  Set up your furniture so you have a clear path. Avoid moving your furniture around.  If any of your floors are uneven, fix them.  If there are any pets around you, be aware of where they are.  Review your medicines with your doctor. Some medicines can make you feel dizzy. This can increase your chance of falling. Ask your doctor what other things that you can do to help prevent falls. This information is not intended to replace advice given to you by your health care provider. Make sure you discuss any questions you have with your health care provider. Document Released: 08/03/2009 Document Revised: 03/14/2016 Document Reviewed: 11/11/2014 Elsevier Interactive Patient Education  2017 [employer].

## 2017-02-17 NOTE — Progress Notes (Signed)
    Tammy Galvan     MRN: [id number]      DOB: 1933-04-24  HPI: Patient is in for annual physical exam. No other health concerns are expressed or addressed at the visit. Recent labs, if available are reviewed. Immunization is reviewed , and  updated if needed.   PE: Pleasant  female, alert and oriented x 3, in no cardio-pulmonary distress. Afebrile. HEENT No facial trauma or asymetry. Sinuses non tender.  Extra occullar muscles intact, pupils equally reactive to light. External ears normal, tympanic membranes clear. Oropharynx moist, no exudate. Neck: supple, no adenopathy,JVD or thyromegaly.No bruits.  Chest: Clear to ascultation bilaterally.No crackles or wheezes. Non tender to palpation  Breast: No asymetry,no masses or lumps. No tenderness. No nipple discharge or inversion. No axillary or supraclavicular adenopathy  Cardiovascular system; Heart sounds normal,  S1 and  S2 ,no S3.  No murmur, or thrill. Apical beat not displaced Peripheral pulses normal.  Abdomen: Soft, non tender, no organomegaly or masses. No bruits. Bowel sounds normal. No guarding, tenderness or rebound.  Rectal:  Normal sphincter tone. No rectal mass. Guaiac negative stool.  GU: External genitalia normal female genitalia , normal female distribution of hair. No lesions. Urethral meatus normal in size, no  Prolapse, no lesions visibly  Present. Bladder non tender. Vagina pink and moist , with no visible lesions , discharge present . Adequate pelvic support no  cystocele or rectocele noted  Uterus absent, no adnexal masses, no adnexal tenderness.   Musculoskeletal exam: Full ROM of spine, hips , shoulders and knees. No deformity ,swelling or crepitus noted. No muscle wasting or atrophy.   Neurologic: Cranial nerves 2 to 12 intact. Power, tone ,sensation and reflexes normal throughout. No disturbance in gait. No tremor.  Skin: Intact, no ulceration, erythema , scaling or rash  noted. Pigmentation normal throughout  Psych; Normal mood and affect. Judgement and concentration normal   Assessment & Plan:  Annual physical exam Annual exam as documented.  Immunization and cancer screening needs are specifically addressed at this visit.   Colon cancer screening No rectal mass, heme negative stool

## 2017-02-17 NOTE — Patient Instructions (Signed)
f/u with mD in early October, call if you need me sooner.  No changes in medication  Please work on good  health habits so that your health will improve. 1. Commitment to daily physical activity for 30 to 60  minutes, if you are able to do this.  2. Commitment to wise food choices. Aim for half of your  food intake to be vegetable and fruit, one quarter starchy foods, and one quarter protein. Try to eat on a regular schedule  3 meals per day, snacking between meals should be limited to vegetables or fruits or small portions of nuts. 64 ounces of water per day is generally recommended, unless you have specific health conditions, like heart failure or kidney failure where you will need to limit fluid intake.  3. Commitment to sufficient and a  good quality of physical and mental rest daily, generally between 6 to 8 hours per day.  WITH PERSISTANCE AND PERSEVERANCE, THE IMPOSSIBLE , BECOMES THE NORM! It is important that you exercise regularly at least 30 minutes 5 times a week. If you develop chest pain, have severe difficulty breathing, or feel very tired, stop exercising immediately and seek medical attention    Thank you  for choosing  [facility]. We consider it a privelige to serve you.  Delivering excellent health care in a caring and  compassionate way is our goal.  Partnering with you,  so that together we can achieve this goal is our strategy.

## 2017-02-17 NOTE — Assessment & Plan Note (Signed)
No rectal mass, heme negative stool 

## 2017-02-18 LAB — CYTOLOGY - PAP: DIAGNOSIS: NEGATIVE

## 2017-04-05 ENCOUNTER — Other Ambulatory Visit: Payer: Self-pay | Admitting: Family Medicine

## 2017-05-05 ENCOUNTER — Other Ambulatory Visit: Payer: Self-pay | Admitting: Family Medicine

## 2017-07-02 ENCOUNTER — Other Ambulatory Visit: Payer: Self-pay | Admitting: Family Medicine

## 2017-07-29 ENCOUNTER — Encounter: Payer: Self-pay | Admitting: Family Medicine

## 2017-07-29 ENCOUNTER — Ambulatory Visit (INDEPENDENT_AMBULATORY_CARE_PROVIDER_SITE_OTHER): Payer: Medicare Other | Admitting: Family Medicine

## 2017-07-29 VITALS — BP 128/82 | HR 65 | Temp 98.5°F | Resp 16 | Ht 67.0 in | Wt 218.2 lb

## 2017-07-29 DIAGNOSIS — E785 Hyperlipidemia, unspecified: Secondary | ICD-10-CM | POA: Diagnosis not present

## 2017-07-29 DIAGNOSIS — Z23 Encounter for immunization: Secondary | ICD-10-CM

## 2017-07-29 DIAGNOSIS — E038 Other specified hypothyroidism: Secondary | ICD-10-CM

## 2017-07-29 DIAGNOSIS — R7302 Impaired glucose tolerance (oral): Secondary | ICD-10-CM

## 2017-07-29 DIAGNOSIS — I10 Essential (primary) hypertension: Secondary | ICD-10-CM

## 2017-07-29 NOTE — Patient Instructions (Addendum)
Annual physical exam May 1 or after, call if you need me sooner  Wellness visit with nurse in 4.5 months  It is important that you exercise regularly at least 30 minutes 5 times a week. If you develop chest pain, have severe difficulty breathing, or feel very tired, stop exercising immediately and seek medical attention    fasting  Lipid, cmp and EgfR, CBC and hBA1C today  Flu vaccine today  Thank you  for choosing [facility]. We consider it a privelige to serve you.  Delivering excellent health care in a caring and  compassionate way is our goal.  Partnering with you,  so that together we can achieve this goal is our strategy.

## 2017-07-29 NOTE — Progress Notes (Signed)
   Tammy Galvan     MRN: [id number]      DOB: 1933-09-06   HPI Tammy Galvan is here for follow up and re-evaluation of chronic medical conditions, medication management and review of any available recent lab and radiology data.  Preventive health is updated, specifically  Cancer screening and Immunization.   Questions or concerns regarding consultations or procedures which the PT has had in the interim are  addressed. The PT denies any adverse reactions to current medications since the last visit.  There are no new concerns.  There are no specific complaints   ROS Denies recent fever or chills. Denies sinus pressure, nasal congestion, ear pain or sore throat. Denies chest congestion, productive cough or wheezing. Denies chest pains, palpitations and leg swelling Denies abdominal pain, nausea, vomiting,diarrhea or constipation.   Denies dysuria, frequency, hesitancy or incontinence. Denies joint pain, swelling and limitation in mobility. Denies headaches, seizures, numbness, or tingling. Denies depression, anxiety or insomnia. Denies skin break down or rash.   PE  BP 138/62 (BP Location: Left Arm, Patient Position: Sitting, Cuff Size: Normal)   Pulse 65   Temp 98.5 F (36.9 C) (Other (Comment))   Resp 16   Ht 5\' 7"  (1.702 m)   Wt 218 lb 4 oz (99 kg)   SpO2 99%   BMI 34.18 kg/m   Patient alert and oriented and in no cardiopulmonary distress.  HEENT: No facial asymmetry, EOMI,   oropharynx pink and moist.  Neck supple no JVD, no mass.  Chest: Clear to auscultation bilaterally.  CVS: S1, S2 no murmurs, no S3.Regular rate.  ABD: Soft non tender.   Ext: No edema  MS: Adequate ROM spine, shoulders, hips and knees.  Skin: Intact, no ulcerations or rash noted.  Psych: Good eye contact, normal affect. Memory intact not anxious or depressed appearing.  CNS: CN 2-12 intact, power,  normal throughout.no focal deficits noted.   Assessment & Plan  Essential  hypertension Controlled, no change in medication DASH diet and commitment to daily physical activity for a minimum of 30 minutes discussed and encouraged, as a part of hypertension management. The importance of attaining a healthy weight is also discussed.  BP/Weight 07/29/2017 05/15/2017 02/17/2017 02/17/2017 01/23/2017 10/27/6158 04/22/7105  Systolic BP 269 485 462 703 500 938 182  Diastolic BP 82 75 70 70 78 72 67  Wt. (Lbs) 218.25 - 220 220 221 220 217  BMI 34.18 - 34.98 33.45 33.6 33.45 32.99       Hyperlipidemia LDL goal <100 Hyperlipidemia:Low fat diet discussed and encouraged.   Lipid Panel  Lab Results  Component Value Date   CHOL 162 07/29/2017   HDL 61 07/29/2017   LDLCALC 73 01/27/2017   TRIG 58 07/29/2017   CHOLHDL 2.7 07/29/2017   Controlled, no change in medication     Obesity Improved Patient re-educated about  the importance of commitment to a  minimum of 150 minutes of exercise per week.  The importance of healthy food choices with portion control discussed. Encouraged to start a food diary, count calories and to consider  joining a support group. Sample diet sheets offered. Goals set by the patient for the next several months.   Weight /BMI 07/29/2017 02/17/2017 02/17/2017  WEIGHT 218 lb 4 oz 220 lb 220 lb  HEIGHT 5\' 7"  5' 6.5" 5\' 8"   BMI 34.18 kg/m2 34.98 kg/m2 33.45 kg/m2      Other specified hypothyroidism Managed by endo and well controlled

## 2017-07-30 LAB — LIPID PANEL
CHOL/HDL RATIO: 2.7 (calc) (ref <5.0)
CHOLESTEROL: 162 mg/dL (ref <200)
HDL: 61 mg/dL (ref >50)
LDL Cholesterol (Calc): 87 mg/dL (calc)
Non-HDL Cholesterol (Calc): 101 mg/dL (calc) (ref <130)
Triglycerides: 58 mg/dL (ref <150)

## 2017-07-30 LAB — HEMOGLOBIN A1C
Hgb A1c MFr Bld: 5.7 % of total Hgb — ABNORMAL HIGH (ref <5.7)
Mean Plasma Glucose: 117 (calc)
eAG (mmol/L): 6.5 (calc)

## 2017-07-30 LAB — COMPLETE METABOLIC PANEL WITH GFR
AG RATIO: 1.5 (calc) (ref 1.0–2.5)
ALBUMIN MSPROF: 3.8 g/dL (ref 3.6–5.1)
ALT: 13 U/L (ref 6–29)
AST: 18 U/L (ref 10–35)
Alkaline phosphatase (APISO): 66 U/L (ref 33–130)
BUN: 13 mg/dL (ref 7–25)
CALCIUM: 9.4 mg/dL (ref 8.6–10.4)
CO2: 30 mmol/L (ref 20–32)
Chloride: 106 mmol/L (ref 98–110)
Creat: 0.81 mg/dL (ref 0.60–0.88)
GFR, EST AFRICAN AMERICAN: 79 mL/min/{1.73_m2} (ref >=60)
GFR, EST NON AFRICAN AMERICAN: 68 mL/min/{1.73_m2} (ref >=60)
GLOBULIN: 2.5 g/dL (ref 1.9–3.7)
Glucose, Bld: 91 mg/dL (ref 65–99)
POTASSIUM: 4.2 mmol/L (ref 3.5–5.3)
SODIUM: 140 mmol/L (ref 135–146)
TOTAL PROTEIN: 6.3 g/dL (ref 6.1–8.1)
Total Bilirubin: 0.4 mg/dL (ref 0.2–1.2)

## 2017-07-30 LAB — CBC
HCT: 42.8 % (ref 35.0–45.0)
Hemoglobin: 13.7 g/dL (ref 11.7–15.5)
MCH: 26 pg — AB (ref 27.0–33.0)
MCHC: 32 g/dL (ref 32.0–36.0)
MCV: 81.4 fL (ref 80.0–100.0)
MPV: 10.4 fL (ref 7.5–12.5)
PLATELETS: 293 10*3/uL (ref 140–400)
RBC: 5.26 10*6/uL — ABNORMAL HIGH (ref 3.80–5.10)
RDW: 14.3 % (ref 11.0–15.0)
WBC: 6.3 10*3/uL (ref 3.8–10.8)

## 2017-07-31 NOTE — Assessment & Plan Note (Signed)
Managed by endo and well controlled

## 2017-07-31 NOTE — Assessment & Plan Note (Signed)
Controlled, no change in medication DASH diet and commitment to daily physical activity for a minimum of 30 minutes discussed and encouraged, as a part of hypertension management. The importance of attaining a healthy weight is also discussed.  BP/Weight 07/29/2017 05/15/2017 02/17/2017 02/17/2017 01/23/2017 11/25/971 02/21/2991  Systolic BP 426 834 196 222 979 892 119  Diastolic BP 82 75 70 70 78 72 67  Wt. (Lbs) 218.25 - 220 220 221 220 217  BMI 34.18 - 34.98 33.45 33.6 33.45 32.99

## 2017-07-31 NOTE — Assessment & Plan Note (Signed)
Hyperlipidemia:Low fat diet discussed and encouraged.   Lipid Panel  Lab Results  Component Value Date   CHOL 162 07/29/2017   HDL 61 07/29/2017   LDLCALC 73 01/27/2017   TRIG 58 07/29/2017   CHOLHDL 2.7 07/29/2017   Controlled, no change in medication

## 2017-07-31 NOTE — Assessment & Plan Note (Signed)
Improved Patient re-educated about  the importance of commitment to a  minimum of 150 minutes of exercise per week.  The importance of healthy food choices with portion control discussed. Encouraged to start a food diary, count calories and to consider  joining a support group. Sample diet sheets offered. Goals set by the patient for the next several months.   Weight /BMI 07/29/2017 02/17/2017 02/17/2017  WEIGHT 218 lb 4 oz 220 lb 220 lb  HEIGHT 5\' 7"  5' 6.5" 5\' 8"   BMI 34.18 kg/m2 34.98 kg/m2 33.45 kg/m2

## 2017-08-12 ENCOUNTER — Other Ambulatory Visit: Payer: Self-pay | Admitting: "Endocrinology

## 2017-11-05 ENCOUNTER — Encounter: Payer: Self-pay | Admitting: Family Medicine

## 2017-12-16 ENCOUNTER — Other Ambulatory Visit: Payer: Self-pay

## 2017-12-16 ENCOUNTER — Other Ambulatory Visit: Payer: Self-pay | Admitting: "Endocrinology

## 2017-12-16 DIAGNOSIS — E039 Hypothyroidism, unspecified: Secondary | ICD-10-CM

## 2017-12-16 LAB — TSH: TSH: 3.86 m[IU]/L (ref 0.40–4.50)

## 2017-12-16 LAB — T4, FREE: FREE T4: 1.4 ng/dL (ref 0.8–1.8)

## 2017-12-23 ENCOUNTER — Ambulatory Visit (INDEPENDENT_AMBULATORY_CARE_PROVIDER_SITE_OTHER): Payer: Medicare Other | Admitting: "Endocrinology

## 2017-12-23 ENCOUNTER — Encounter: Payer: Self-pay | Admitting: "Endocrinology

## 2017-12-23 VITALS — BP 136/80 | HR 64 | Ht 67.0 in | Wt 222.0 lb

## 2017-12-23 DIAGNOSIS — E038 Other specified hypothyroidism: Secondary | ICD-10-CM | POA: Diagnosis not present

## 2017-12-23 DIAGNOSIS — E042 Nontoxic multinodular goiter: Secondary | ICD-10-CM | POA: Diagnosis not present

## 2017-12-23 MED ORDER — LEVOTHYROXINE SODIUM 75 MCG PO TABS
75.0000 ug | ORAL_TABLET | Freq: Every day | ORAL | 6 refills | Status: DC
Start: 2017-12-23 — End: 2018-08-13

## 2017-12-23 NOTE — Progress Notes (Signed)
HPI  Tammy Galvan is a 84 y.o.-year-old female. -Patient is here to follow-up for her hypothyroidism and multinodular goiter.  She is on Synthroid 50 mcg p.o. every morning.  She reports compliance to this medication.  She continued to gain weight.  She has cold intolerance.   -She was seen for multinodular goiter, status post fine needle aspiration which showed benign follicular adenoma, [name].  Thyroid U/S: From 06/20/2015 was reviewed. It showed right lobe 4.1 cm with a 0.3 cm hypoechoic solid nodule on the superior aspect. Left lobe was measuring 4.2 cm with 2 x 1 x 1.4 cm mixed echogenic partially cystic predominantly solid nodule on the superior aspect. Isthmus measures 0.3 cm.  She did not have any further subsequent surveillance thyroid ultrasound.    ROS: Constitutional: + Gained weight , no fatigue, + subjective hypothermia Eyes: no blurry vision, no xerophthalmia ENT: no sore throat, no nodules palpated in throat, no dysphagia/odynophagia, no hoarseness Cardiovascular: No chest pain, no palpitations Respiratory: no cough/SOB Gastrointestinal: no N/V/D/C Musculoskeletal: no muscle/joint aches Skin: no rashes Neurological: no tremors/numbness/tingling/dizziness Psychiatric: no depression/anxiety  PE: BP 136/80   Pulse 64   Ht 5\' 7"  (1.702 m)   Wt 222 lb (100.7 kg)   BMI 34.77 kg/m  Wt Readings from Last 3 Encounters:  12/23/17 222 lb (100.7 kg)  07/29/17 218 lb 4 oz (99 kg)  02/17/17 220 lb (99.8 kg)    Constitutional: + Obese,  not in acute distress.   Eyes: PERRLA, EOMI, no exophthalmos ENT: moist mucous membranes, left lobe thyroid nodule is palpable, no cervical lymphadenopathy Cardiovascular: RRR, No MRG Respiratory: CTA B Gastrointestinal: abdomen soft, NT, ND, BS+ Musculoskeletal: no deformities, strength intact in all 4;  Skin: moist, warm, she has stasis dermatitis on BLE Neurological: No tremors on outstretched  Hands,  no tremor with  outstretched hands, DTR normal in all 4  Recent Results (from the past 2160 hour(s))  T4, Free     Status: None   Collection Time: 12/16/17  9:00 AM  Result Value Ref Range   Free T4 1.4 0.8 - 1.8 ng/dL  TSH     Status: None   Collection Time: 12/16/17  9:00 AM  Result Value Ref Range   TSH 3.86 0.40 - 4.50 mIU/L    ASSESSMENT: 1. MNG 2. hypothyroidism  PLAN:  -She is responding to Synthroid , benefit from higher dose.  I discussed and increase her Synthroid to 75 mcg p.o. Nightly, given her progressive weight gain.   - We discussed about correct intake of levothyroxine, at fasting, with water, separated by at least 30 minutes from breakfast, and separated by more than 4 hours from calcium, iron, multivitamins, acid reflux medications (PPIs). -Patient is made aware of the fact that thyroid hormone replacement is needed for life, dose to be adjusted by periodic monitoring of thyroid function tests.  -She is status post fine needle aspiration which was reported to be benign follicular adenoma-[name]. -She will not need intervention for this at this point. -She will  need   surveillance ultrasound before her next visit.    I advised her to maintain close follow up with her primary care physician for her primary care needs.   Glade Lloyd, MD  Tel. [phone], Fax [phone]  -  This note was partially dictated with voice recognition software. Similar sounding words can be transcribed inadequately or may not  be corrected upon review.

## 2017-12-29 ENCOUNTER — Encounter: Payer: Self-pay | Admitting: Family Medicine

## 2018-01-02 ENCOUNTER — Other Ambulatory Visit: Payer: Self-pay | Admitting: Family Medicine

## 2018-01-08 ENCOUNTER — Other Ambulatory Visit: Payer: Self-pay | Admitting: Family Medicine

## 2018-01-30 ENCOUNTER — Ambulatory Visit (INDEPENDENT_AMBULATORY_CARE_PROVIDER_SITE_OTHER): Payer: Medicare Other | Admitting: Family Medicine

## 2018-01-30 ENCOUNTER — Encounter: Payer: Self-pay | Admitting: Family Medicine

## 2018-01-30 ENCOUNTER — Ambulatory Visit: Payer: Medicare Other | Admitting: Family Medicine

## 2018-01-30 VITALS — BP 140/80 | HR 58 | Resp 16 | Ht 67.0 in | Wt 220.0 lb

## 2018-01-30 DIAGNOSIS — M170 Bilateral primary osteoarthritis of knee: Secondary | ICD-10-CM | POA: Diagnosis not present

## 2018-01-30 DIAGNOSIS — M25561 Pain in right knee: Secondary | ICD-10-CM

## 2018-01-30 MED ORDER — IBUPROFEN 600 MG PO TABS: 600.0000 mg | ORAL_TABLET | Freq: Three times a day (TID) | ORAL | 0 refills | Status: DC | PRN

## 2018-01-30 MED ORDER — METHYLPREDNISOLONE ACETATE 80 MG/ML IJ SUSP: 80.0000 mg | Freq: Once | INTRAMUSCULAR | Status: DC

## 2018-01-30 NOTE — Patient Instructions (Addendum)
ICE to knee when you get home for 20 min Ice again before bed Take ibuprofen as needed for pain Take with food  Call for any questions or problems

## 2018-01-30 NOTE — Progress Notes (Signed)
Chief Complaint  Patient presents with  . Knee Pain    right sided knee pain. Flare started Monday, Tues it was feeling some better but then its been hurting to move and walk on it since. Fell yesterday    Bilateral knee pain for years  R>L.  The right knee flares up periodically and will become severely painful.  Right now it has been hurting since Monday. It is swollen, warm and feels like it will give out.  Yesterday she did fall due to knee.  NOT injured. Patient had a knee x ray in 2016.  I pulled up the films and showed them to her.  She has medial joint space narrowing and early spurring c/w arthritis.  She has entered several events in the senior games next week and wants to be well enough to play basketball, shuffle board and horse shoes.  Patient Active Problem List   Diagnosis Date Noted  . Vitamin D deficiency 10/19/2015  . Other specified hypothyroidism 08/29/2015  . Nontoxic multinodular goiter 08/15/2015  . Abnormal thyroid stimulating hormone (TSH) level 06/15/2015  . Osteoarthritis of both knees 11/07/2013  . Back pain with radiation 11/04/2013  . Hyperlipidemia LDL goal <100 08/31/2009  . Obesity 10/01/2008  . GLAUCOMA 01/27/2008  . Essential hypertension 01/27/2008  . DEGENERATIVE JOINT DISEASE, SPINE 01/27/2008    Outpatient Encounter Medications as of 01/30/2018  Medication Sig  . amLODipine (NORVASC) 10 MG tablet TAKE 1 TABLET EVERY DAY  . aspirin 325 MG tablet Take 325 mg by mouth daily.    . brimonidine-timolol (COMBIGAN) 0.2-0.5 % ophthalmic solution Place 1 drop into both eyes 2 (two) times daily.  . hydrochlorothiazide (HYDRODIURIL) 25 MG tablet TAKE 1 TABLET BY MOUTH EVERY DAY  . KLOR-CON M20 20 MEQ tablet TAKE 1 TABLET BY MOUTH EVERY DAY  . levothyroxine (SYNTHROID) 75 MCG tablet Take 1 tablet (75 mcg total) by mouth daily before breakfast.  . pravastatin (PRAVACHOL) 10 MG tablet TAKE 1 TABLET (10 MG TOTAL) BY MOUTH DAILY.  Marland Kitchen ibuprofen (ADVIL,MOTRIN)  600 MG tablet Take 1 tablet (600 mg total) by mouth every 8 (eight) hours as needed.  . metoprolol succinate (TOPROL-XL) 25 MG 24 hr tablet TAKE 1 TABLET EVERY DAY (Patient not taking: Reported on 01/30/2018)   Facility-Administered Encounter Medications as of 01/30/2018  Medication  . methylPREDNISolone acetate (DEPO-MEDROL) injection 80 mg    Allergies  Allergen Reactions  . Ace Inhibitors Other (See Comments)    Cough     Review of Systems  Constitutional: Negative for activity change, appetite change and unexpected weight change.  HENT: Negative for congestion and dental problem.   Eyes: Negative for redness and visual disturbance.  Respiratory: Negative for cough.   Cardiovascular: Negative for chest pain and palpitations.  Gastrointestinal: Negative for constipation and diarrhea.  Genitourinary: Negative for difficulty urinating, flank pain and frequency.  Musculoskeletal: Positive for arthralgias, back pain, gait problem and joint swelling.  Psychiatric/Behavioral: Negative for dysphoric mood. The patient is not nervous/anxious.   All other systems reviewed and are negative.   Physical Exam  Constitutional: She is oriented to person, place, and time. She appears well-developed and well-nourished. No distress.  Energetic and young in appearance  HENT:  Head: Normocephalic and atraumatic.  Mouth/Throat: Oropharynx is clear and moist.  Cardiovascular: Normal rate and regular rhythm.  Murmur heard. Pulmonary/Chest: Effort normal and breath sounds normal.  Musculoskeletal:       Right knee: She exhibits decreased range of motion,  swelling and effusion. Tenderness found. Medial joint line tenderness noted.       Left knee: Normal.  Neurological: She is alert and oriented to person, place, and time. Coordination abnormal.  Antalgic gait with cane  Psychiatric: She has a normal mood and affect. Her behavior is normal.   TIME OUT: Consent  Appropriate site lateral (      RIGHT    )  knee prepped and marked Area infiltrated with a 1 cc 1% lidocaine wheal Knee joint injected with 3 cc of 1% lidocaine and 80 mg of DepoMedrol Patient tolerated procedure well Post injection instructions reviewed BP 140/80   Pulse (!) 58   Resp 16   Ht 5\' 7"  (1.702 m)   Wt 220 lb (99.8 kg)   SpO2 97%   BMI 34.46 kg/m     ASSESSMENT/PLAN:  1. Primary osteoarthritis of both knees  Discussed conservative management of knee arthritis RX ibuprofen with precautions Labs reviewed, patient has no GI history or liver/kidney impairment  - PR DRAIN/INJECT LARGE JOINT/BURSA - methylPREDNISolone acetate (DEPO-MEDROL) injection 80 mg  2. Acute pain of right knee  - PR DRAIN/INJECT LARGE JOINT/BURSA - methylPREDNISolone acetate (DEPO-MEDROL) injection 80 mg   Patient Instructions  ICE to knee when you get home for 20 min Ice again before bed Take ibuprofen as needed for pain Take with food  Call for any questions or problems   Raylene Everts, MD

## 2018-02-13 ENCOUNTER — Telehealth: Payer: Self-pay

## 2018-02-13 DIAGNOSIS — R7301 Impaired fasting glucose: Secondary | ICD-10-CM

## 2018-02-13 DIAGNOSIS — I10 Essential (primary) hypertension: Secondary | ICD-10-CM

## 2018-02-13 DIAGNOSIS — E559 Vitamin D deficiency, unspecified: Secondary | ICD-10-CM

## 2018-02-13 DIAGNOSIS — E785 Hyperlipidemia, unspecified: Secondary | ICD-10-CM

## 2018-02-13 LAB — CBC
HEMATOCRIT: 43.1 % (ref 35.0–45.0)
HEMOGLOBIN: 13.8 g/dL (ref 11.7–15.5)
MCH: 26 pg — ABNORMAL LOW (ref 27.0–33.0)
MCHC: 32 g/dL (ref 32.0–36.0)
MCV: 81.3 fL (ref 80.0–100.0)
MPV: 10.9 fL (ref 7.5–12.5)
Platelets: 287 10*3/uL (ref 140–400)
RBC: 5.3 10*6/uL — AB (ref 3.80–5.10)
RDW: 14 % (ref 11.0–15.0)
WBC: 7.3 10*3/uL (ref 3.8–10.8)

## 2018-02-13 LAB — LIPID PANEL
CHOLESTEROL: 160 mg/dL (ref <200)
HDL: 52 mg/dL (ref >50)
LDL Cholesterol (Calc): 91 mg/dL (calc)
Non-HDL Cholesterol (Calc): 108 mg/dL (calc) (ref <130)
Total CHOL/HDL Ratio: 3.1 (calc) (ref <5.0)
Triglycerides: 82 mg/dL (ref <150)

## 2018-02-13 LAB — COMPLETE METABOLIC PANEL WITH GFR
AG Ratio: 1.6 (calc) (ref 1.0–2.5)
ALBUMIN MSPROF: 3.9 g/dL (ref 3.6–5.1)
ALKALINE PHOSPHATASE (APISO): 71 U/L (ref 33–130)
ALT: 13 U/L (ref 6–29)
AST: 14 U/L (ref 10–35)
BUN: 14 mg/dL (ref 7–25)
CALCIUM: 9.4 mg/dL (ref 8.6–10.4)
CO2: 30 mmol/L (ref 20–32)
CREATININE: 0.83 mg/dL (ref 0.60–0.88)
Chloride: 105 mmol/L (ref 98–110)
GFR, EST NON AFRICAN AMERICAN: 66 mL/min/{1.73_m2} (ref >=60)
GFR, Est African American: 76 mL/min/{1.73_m2} (ref >=60)
GLUCOSE: 91 mg/dL (ref 65–99)
Globulin: 2.5 g/dL (calc) (ref 1.9–3.7)
Potassium: 3.9 mmol/L (ref 3.5–5.3)
Sodium: 142 mmol/L (ref 135–146)
Total Bilirubin: 0.7 mg/dL (ref 0.2–1.2)
Total Protein: 6.4 g/dL (ref 6.1–8.1)

## 2018-02-13 LAB — HEMOGLOBIN A1C
HEMOGLOBIN A1C: 5.8 %{Hb} — AB (ref <5.7)
MEAN PLASMA GLUCOSE: 120 (calc)
eAG (mmol/L): 6.6 (calc)

## 2018-02-13 NOTE — Telephone Encounter (Signed)
Labs ordered.

## 2018-02-14 LAB — VITAMIN D 25 HYDROXY (VIT D DEFICIENCY, FRACTURES): VIT D 25 HYDROXY: 20 ng/mL — AB (ref 30–100)

## 2018-02-18 ENCOUNTER — Encounter: Payer: Medicare Other | Admitting: Family Medicine

## 2018-05-05 ENCOUNTER — Encounter: Payer: Self-pay | Admitting: Family Medicine

## 2018-05-05 ENCOUNTER — Encounter (INDEPENDENT_AMBULATORY_CARE_PROVIDER_SITE_OTHER): Payer: Self-pay

## 2018-05-05 ENCOUNTER — Ambulatory Visit (INDEPENDENT_AMBULATORY_CARE_PROVIDER_SITE_OTHER): Payer: Medicare Other | Admitting: Family Medicine

## 2018-05-05 VITALS — BP 160/84 | HR 58 | Ht 66.0 in | Wt 216.8 lb

## 2018-05-05 DIAGNOSIS — Z0000 Encounter for general adult medical examination without abnormal findings: Secondary | ICD-10-CM

## 2018-05-05 DIAGNOSIS — Z1231 Encounter for screening mammogram for malignant neoplasm of breast: Secondary | ICD-10-CM

## 2018-05-05 DIAGNOSIS — I10 Essential (primary) hypertension: Secondary | ICD-10-CM

## 2018-05-05 MED ORDER — AMLODIPINE BESYLATE 10 MG PO TABS: 10.0000 mg | ORAL_TABLET | Freq: Every day | ORAL | 3 refills | Status: DC

## 2018-05-05 NOTE — Patient Instructions (Addendum)
Wellnes visit with nurse in early September, flu vaccine at that visit  Please schedule her mammogram at checkout  MD follow up in January, call if you need me before  Please return 3 stool cards for testing  Blood pressure is elevated today because you have not been taking your amlodipine, it is vital that you take all of your prescribed medications every day as directed  Continue to work on healthy food choice and regular physical activity at least 4 days per week for 150 minutes  Thank you  for choosing [facility]. We consider it a privelige to serve you.  Delivering excellent health care in a caring and  compassionate way is our goal.  Partnering with you,  so that together we can achieve this goal is our strategy.

## 2018-05-05 NOTE — Progress Notes (Signed)
    Tammy Galvan     MRN: [id number]      DOB: 1933-05-10  HPI: Patient is in for annual physical exam. No other health concerns are expressed or addressed at the visit. Recent labs, if available are reviewed. Immunization is reviewed , and  updated if needed.   PE: BP (!) 160/84   Pulse (!) 58   Ht 5\' 6"  (1.676 m)   Wt 216 lb 12 oz (98.3 kg)   SpO2 98%   BMI 34.98 kg/m   Pleasant  female, alert and oriented x 3, in no cardio-pulmonary distress. Afebrile. HEENT No facial trauma or asymetry. Sinuses non tender.  Extra occullar muscles intact, pupils equally reactive to light. External ears normal, tympanic membranes clear. Oropharynx moist, no exudate. Neck: supple, no adenopathy,JVD or thyromegaly.No bruits.  Chest: Clear to ascultation bilaterally.No crackles or wheezes. Non tender to palpation  Breast: No asymetry,no masses or lumps. No tenderness. No nipple discharge or inversion. No axillary or supraclavicular adenopathy  Cardiovascular system; Heart sounds normal,  S1 and  S2 ,no S3.  No murmur, or thrill. Apical beat not displaced Peripheral pulses normal.  Abdomen: Soft, non tender, no organomegaly or masses. No bruits. Bowel sounds normal. No guarding, tenderness or rebound.  Rectal:  No exam indicated, pt to return 3 stool cards.  GU: Asymptomatic, no exam done   Musculoskeletal exam: Decreased though adequate  ROM of spine, hips , shoulders and knees. Mild  deformity ,swelling and  crepitus noted.in knees No muscle wasting or atrophy.   Neurologic: Cranial nerves 2 to 12 intact. Power, tone ,sensation and reflexes normal throughout. No disturbance in gait. No tremor.  Skin: Intact, no ulceration, erythema , scaling or rash noted. Pigmentation normal throughout  Psych; Normal mood and affect. Judgement and concentration normal   Assessment & Plan:  Annual physical exam Annual exam as documented. Counseling done  re healthy lifestyle  involving commitment to 150 minutes exercise per week, heart healthy diet, and attaining healthy weight.The importance of adequate sleep also discussed. Regular seat belt use and home safety, is also discussed. Changes in health habits are decided on by the patient with goals and time frames  set for achieving them. Immunization and cancer screening needs are specifically addressed at this visit.   Essential hypertension Not at goal and uncontrolled as pt has  Been out of and not taking amlodipine for over 2 weeks Meds are reviewed and the importance of compliance is stressed DASH diet and commitment to daily physical activity for a minimum of 30 minutes discussed and encouraged, as a part of hypertension management. The importance of attaining a healthy weight is also discussed.  BP/Weight 05/05/2018 01/30/2018 12/23/2017 07/29/2017 05/15/2017 02/17/2017 7/40/8144  Systolic BP 818 563 149 702 637 858 850  Diastolic BP 84 80 80 82 75 70 70  Wt. (Lbs) 216.75 220 222 218.25 - 220 220  BMI 34.98 34.46 34.77 34.18 - 34.98 33.45

## 2018-05-05 NOTE — Assessment & Plan Note (Signed)

## 2018-05-09 ENCOUNTER — Encounter: Payer: Self-pay | Admitting: Family Medicine

## 2018-05-09 NOTE — Assessment & Plan Note (Signed)
Not at goal and uncontrolled as pt has  Been out of and not taking amlodipine for over 2 weeks Meds are reviewed and the importance of compliance is stressed DASH diet and commitment to daily physical activity for a minimum of 30 minutes discussed and encouraged, as a part of hypertension management. The importance of attaining a healthy weight is also discussed.  BP/Weight 05/05/2018 01/30/2018 12/23/2017 07/29/2017 05/15/2017 02/17/2017 7/00/1749  Systolic BP 449 675 916 384 665 993 570  Diastolic BP 84 80 80 82 75 70 70  Wt. (Lbs) 216.75 220 222 218.25 - 220 220  BMI 34.98 34.46 34.77 34.18 - 34.98 33.45

## 2018-05-14 ENCOUNTER — Ambulatory Visit (HOSPITAL_COMMUNITY)
Admission: RE | Admit: 2018-05-14 | Discharge: 2018-05-14 | Disposition: A | Discharge status: Home / Self Care | Payer: Medicare Other | Source: Ambulatory Visit | Attending: Family Medicine | Admitting: Family Medicine

## 2018-05-14 DIAGNOSIS — Z1231 Encounter for screening mammogram for malignant neoplasm of breast: Secondary | ICD-10-CM | POA: Insufficient documentation

## 2018-06-03 ENCOUNTER — Ambulatory Visit (HOSPITAL_COMMUNITY)
Admission: RE | Admit: 2018-06-03 | Discharge: 2018-06-03 | Disposition: A | Discharge status: Home / Self Care | Payer: Medicare Other | Source: Ambulatory Visit | Attending: "Endocrinology | Admitting: "Endocrinology

## 2018-06-03 DIAGNOSIS — E042 Nontoxic multinodular goiter: Secondary | ICD-10-CM | POA: Insufficient documentation

## 2018-06-21 ENCOUNTER — Other Ambulatory Visit: Payer: Self-pay | Admitting: Family Medicine

## 2018-06-25 ENCOUNTER — Ambulatory Visit (INDEPENDENT_AMBULATORY_CARE_PROVIDER_SITE_OTHER): Payer: Medicare Other | Admitting: "Endocrinology

## 2018-06-25 ENCOUNTER — Encounter: Payer: Self-pay | Admitting: "Endocrinology

## 2018-06-25 VITALS — BP 143/77 | HR 59 | Ht 67.0 in | Wt 218.6 lb

## 2018-06-25 DIAGNOSIS — E038 Other specified hypothyroidism: Secondary | ICD-10-CM

## 2018-06-25 DIAGNOSIS — E042 Nontoxic multinodular goiter: Secondary | ICD-10-CM

## 2018-06-25 LAB — TSH: TSH: 1.7 mIU/L (ref 0.40–4.50)

## 2018-06-25 LAB — T4, FREE: Free T4: 1.3 ng/dL (ref 0.8–1.8)

## 2018-06-25 NOTE — Progress Notes (Signed)
HPI  Tammy Galvan is a 84 y.o.-year-old female. -Patient is here to follow-up for her hypothyroidism and multinodular goiter.  She is on Synthroid 75 mcg p.o. every morning. -She reports compliance to this medication, however did not call for her previsit labs. -Her thyroid ultrasound is consistent with slight shrinking of bilateral thyroid lobes as well as previously documented nodules.  No new findings. -He has no new complaints today.  -She was seen for multinodular goiter, status post fine needle aspiration which showed benign follicular adenoma, [name].   ROS: Constitutional: + Steady weight,  no fatigue, - subjective hypothermia Eyes: no blurry vision, no xerophthalmia ENT: no sore throat, no nodules palpated in throat, no dysphagia/odynophagia, no hoarseness Cardiovascular: No chest pain, no palpitations Musculoskeletal: no muscle/joint aches Skin: no rashes Neurological: no tremors/numbness/tingling/dizziness Psychiatric: no depression/anxiety  PE: BP (!) 143/77   Pulse (!) 59   Ht 5\' 7"  (1.702 m)   Wt 218 lb 9.6 oz (99.2 kg)   SpO2 99%   BMI 34.24 kg/m  Wt Readings from Last 3 Encounters:  06/25/18 218 lb 9.6 oz (99.2 kg)  05/05/18 216 lb 12 oz (98.3 kg)  01/30/18 220 lb (99.8 kg)    Constitutional: + Obese,  not in acute distress.   Eyes: PERRLA, EOMI, no exophthalmos ENT: moist mucous membranes, left lobe thyroid nodule is palpable, no cervical lymphadenopathy Musculoskeletal: no deformities, strength intact in all 4;  Skin: moist, warm, she has stasis dermatitis on BLE Neurological: No tremors on outstretched  Hands,  no tremor with outstretched hands, DTR normal in all 4  June 03, 2018 thyroid ultrasound IMPRESSION: 1. Normal-sized thyroid with stable solitary left nodule, previously biopsied. No indication for biopsy or dedicated imaging follow-up  ASSESSMENT: 1. MNG 2. hypothyroidism  PLAN: -She will be sent to lab for new set of  thyroid function tests.  She will be called with results if abnormal. -In the meantime, I advised her to continue levothyroxine 75 mcg p.o. every morning.  - We discussed about correct intake of levothyroxine, at fasting, with water, separated by at least 30 minutes from breakfast, and separated by more than 4 hours from calcium, iron, multivitamins, acid reflux medications (PPIs). -Patient is made aware of the fact that thyroid hormone replacement is needed for life, dose to be adjusted by periodic monitoring of thyroid function tests.  -She is status post fine needle aspiration which was reported to be benign follicular adenoma-[name].  Her previsit thyroid ultrasound from June 03, 2018 is consistent with shrinking of bilateral thyroid lobes and previously biopsied nodules.  She will not need any further intervention or follow-up ultrasound.   I advised her to maintain close follow up with her primary care physician for her primary care needs.   Glade Lloyd, MD  Tel. [phone], Fax [phone]  -  This note was partially dictated with voice recognition software. Similar sounding words can be transcribed inadequately or may not  be corrected upon review.

## 2018-07-27 ENCOUNTER — Other Ambulatory Visit: Payer: Self-pay

## 2018-07-27 ENCOUNTER — Ambulatory Visit (INDEPENDENT_AMBULATORY_CARE_PROVIDER_SITE_OTHER): Payer: Medicare Other

## 2018-07-27 VITALS — BP 122/78 | HR 87 | Resp 16 | Ht 67.0 in | Wt 218.0 lb

## 2018-07-27 DIAGNOSIS — Z23 Encounter for immunization: Secondary | ICD-10-CM | POA: Diagnosis not present

## 2018-07-27 DIAGNOSIS — Z0000 Encounter for general adult medical examination without abnormal findings: Secondary | ICD-10-CM

## 2018-07-27 MED ORDER — IBUPROFEN 600 MG PO TABS: 600.0000 mg | ORAL_TABLET | Freq: Three times a day (TID) | ORAL | 0 refills | Status: DC | PRN

## 2018-07-27 NOTE — Progress Notes (Signed)
Subjective:   Tammy Galvan is a 84 y.o. female who presents for Medicare Annual (Subsequent) preventive examination.  Review of Systems:   Cardiac Risk Factors include: advanced age (>59men, >62 women);hypertension;obesity (BMI >30kg/m2);dyslipidemia     Objective:     Vitals: BP 122/78   Pulse 87   Resp 16   Ht 5\' 7"  (1.702 m)   Wt 218 lb (98.9 kg)   SpO2 97%   BMI 34.14 kg/m   Body mass index is 34.14 kg/m.  Advanced Directives 02/17/2017 07/01/2012  Does Patient Have a Medical Advance Directive? No Patient does not have advance directive;Patient would not like information  Would patient like information on creating a medical advance directive? Yes (MAU/Ambulatory/Procedural Areas - Information given) -    Tobacco Social History   Tobacco Use  Smoking Status Never Smoker  Smokeless Tobacco Never Used     Counseling given: Not Answered   Clinical Intake:  Pre-visit preparation completed: Yes  Pain : No/denies pain Pain Score: 0-No pain     Nutritional Status: BMI > 30  Obese Diabetes: No  How often do you need to have someone help you when you read instructions, pamphlets, or other written materials from your doctor or pharmacy?: 1 - Never  Interpreter Needed?: No  Information entered by :: Lakeita Panther LPN  Past Medical History:  Diagnosis Date  . Glaucoma 2010  . Hyperlipidemia   . Hypertension   . Obesity   . Prediabetes 2013  . Thyroid disease   . TIA (transient ischemic attack)    Past Surgical History:  Procedure Laterality Date  . ABDOMINAL HYSTERECTOMY  1986  . CATARACT EXTRACTION    . COLONOSCOPY  07/01/2012   Procedure: COLONOSCOPY;  Surgeon: Rogene Houston, MD;  Location: [facility];  Service: Endoscopy;  Laterality: N/A;  830  . EYE SURGERY      bilateral cataract exrtaction  . FRACTURE SURGERY     right ankle and left ankle    Family History  Problem Relation Age of Onset  . Arthritis Mother   . Diabetes Father   .  Heart disease Father   . Stroke Sister   . Diabetes Sister   . Pulmonary embolism Sister   . Kidney disease Brother        on dialysis  . Kidney failure Brother    Social History   Socioeconomic History  . Marital status: Widowed    Spouse name: Not on file  . Number of children: Not on file  . Years of education: Not on file  . Highest education level: Not on file  Occupational History  . Not on file  Social Needs  . Financial resource strain: Not on file  . Food insecurity:    Worry: Not on file    Inability: Not on file  . Transportation needs:    Medical: Not on file    Non-medical: Not on file  Tobacco Use  . Smoking status: Never Smoker  . Smokeless tobacco: Never Used  Substance and Sexual Activity  . Alcohol use: No  . Drug use: No  . Sexual activity: Never    Birth control/protection: Surgical  Lifestyle  . Physical activity:    Days per week: Not on file    Minutes per session: Not on file  . Stress: Not on file  Relationships  . Social connections:    Talks on phone: Not on file    Gets together: Not on file  Attends religious service: Not on file    Active member of club or organization: Not on file    Attends meetings of clubs or organizations: Not on file    Relationship status: Not on file  Other Topics Concern  . Not on file  Social History Narrative  . Not on file    Outpatient Encounter Medications as of 07/27/2018  Medication Sig  . amLODipine (NORVASC) 10 MG tablet Take 1 tablet (10 mg total) by mouth daily.  Marland Kitchen aspirin 325 MG tablet Take 325 mg by mouth daily.    . brimonidine-timolol (COMBIGAN) 0.2-0.5 % ophthalmic solution Place 1 drop into both eyes 2 (two) times daily.  . hydrochlorothiazide (HYDRODIURIL) 25 MG tablet TAKE 1 TABLET BY MOUTH EVERY DAY  . ibuprofen (ADVIL,MOTRIN) 600 MG tablet Take 1 tablet (600 mg total) by mouth every 8 (eight) hours as needed.  Marland Kitchen KLOR-CON M20 20 MEQ tablet TAKE 1 TABLET BY MOUTH EVERY DAY  .  levothyroxine (SYNTHROID) 75 MCG tablet Take 1 tablet (75 mcg total) by mouth daily before breakfast.  . metoprolol succinate (TOPROL-XL) 25 MG 24 hr tablet TAKE 1 TABLET BY MOUTH EVERY DAY  . pravastatin (PRAVACHOL) 10 MG tablet TAKE 1 TABLET BY MOUTH EVERY DAY   Facility-Administered Encounter Medications as of 07/27/2018  Medication  . methylPREDNISolone acetate (DEPO-MEDROL) injection 80 mg    Activities of Daily Living In your present state of health, do you have any difficulty performing the following activities: 07/27/2018  Hearing? N  Vision? N  Difficulty concentrating or making decisions? N  Walking or climbing stairs? N  Dressing or bathing? N  Doing errands, shopping? N  Preparing Food and eating ? N  Using the Toilet? N  In the past six months, have you accidently leaked urine? N  Do you have problems with loss of bowel control? N  Managing your Medications? N  Managing your Finances? N  Housekeeping or managing your Housekeeping? N  Some recent data might be hidden    Patient Care Team: Fayrene Helper, MD as PCP - General    Assessment:   This is a routine wellness examination for Tammy Galvan.  Exercise Activities and Dietary recommendations Current Exercise Habits: Home exercise routine;Structured exercise class, Time (Minutes): 35, Frequency (Times/Week): 5, Weekly Exercise (Minutes/Week): 175, Intensity: Mild  Goals    . Have 3 meals a day     Recommend eating 3 balanced meals a day and cutting back on sweets.       Fall Risk Fall Risk  07/27/2018 12/23/2017 02/17/2017 01/23/2017 12/19/2016  Falls in the past year? Yes No No No No  Number falls in past yr: 1 - - - -  Risk for fall due to : - - - - -   Is the patient's home free of loose throw rugs in walkways, pet beds, electrical cords, etc?   no      Grab bars in the bathroom? yes      Handrails on the stairs?   yes      Adequate lighting?   yes  Timed Get Up and Go performed:   Depression Screen PHQ  2/9 Scores 07/27/2018 01/30/2018 12/23/2017 02/17/2017  PHQ - 2 Score 0 0 0 0     Cognitive Function     6CIT Screen 07/27/2018 02/17/2017  What Year? 0 points 0 points  What month? 0 points 0 points  What time? 0 points 0 points  Count back from 20 0 points 0  points  Months in reverse 0 points 0 points  Repeat phrase 0 points 0 points  Total Score 0 0    Immunization History  Administered Date(s) Administered  . Influenza Split 08/30/2011, 07/08/2012  . Influenza Whole 08/05/2007, 06/26/2010  . Influenza,inj,Quad PF,6+ Mos 07/15/2013, 06/14/2014, 10/19/2015, 08/08/2016, 07/29/2017, 07/27/2018  . Pneumococcal Conjugate-13 06/15/2015  . Pneumococcal Polysaccharide-23 04/03/2009  . Td 06/18/2007    Qualifies for Shingles Vaccine? Ask insurance if covered   Screening Tests Health Maintenance  Topic Date Due  . INFLUENZA VACCINE  05/21/2018  . TETANUS/TDAP  09/01/2019 (Originally 06/17/2017)  . DEXA SCAN  Completed  . PNA vac Low Risk Adult  Completed    Cancer Screenings: Lung: Low Dose CT Chest recommended if Age 62-80 years, 30 pack-year currently smoking OR have quit w/in 15years. Patient does not qualify. Breast:  Up to date on Mammogram? Yes   Up to date of Bone Density/Dexa? Yes Colorectal: up to date   Additional Screenings:  Hepatitis C Screening:      Plan:     I have personally reviewed and noted the following in the patient's chart:   . Medical and social history . Use of alcohol, tobacco or illicit drugs  . Current medications and supplements . Functional ability and status . Nutritional status . Physical activity . Advanced directives . List of other physicians . Hospitalizations, surgeries, and ER visits in previous 12 months . Vitals . Screenings to include cognitive, depression, and falls . Referrals and appointments  In addition, I have reviewed and discussed with patient certain preventive protocols, quality metrics, and best practice  recommendations. A written personalized care plan for preventive services as well as general preventive health recommendations were provided to patient.     Kate Sable, LPN, LPN  [id number]

## 2018-07-27 NOTE — Patient Instructions (Addendum)
Tammy Galvan , Thank you for taking time to come for your Medicare Wellness Visit. I appreciate your ongoing commitment to your health goals. Please review the following plan we discussed and let me know if I can assist you in the future.   Screening recommendations/referrals: Colonoscopy: utd Mammogram: utd Bone Density: utd Recommended yearly ophthalmology/optometry visit for glaucoma screening and checkup Recommended yearly dental visit for hygiene and checkup  Vaccinations: Influenza vaccine: today  Pneumococcal vaccine: utd Tdap vaccine: postponed by dr Kennith Gain vaccine: ask insurance if covered  Advanced directives:    Next appointment: wellness in 1 yr    Preventive Care 82 Years and Older, Female Preventive care refers to lifestyle choices and visits with your health care provider that can promote health and wellness. What does preventive care include?  A yearly physical exam. This is also called an annual well check.  Dental exams once or twice a year.  Routine eye exams. Ask your health care provider how often you should have your eyes checked.  Personal lifestyle choices, including:  Daily care of your teeth and gums.  Regular physical activity.  Eating a healthy diet.  Avoiding tobacco and drug use.  Limiting alcohol use.  Practicing safe sex.  Taking low-dose aspirin every day.  Taking vitamin and mineral supplements as recommended by your health care provider. What happens during an annual well check? The services and screenings done by your health care provider during your annual well check will depend on your age, overall health, lifestyle risk factors, and family history of disease. Counseling  Your health care provider may ask you questions about your:  Alcohol use.  Tobacco use.  Drug use.  Emotional well-being.  Home and relationship well-being.  Sexual activity.  Eating habits.  History of falls.  Memory and ability to  understand (cognition).  Work and work Statistician.  Reproductive health. Screening  You may have the following tests or measurements:  Height, weight, and BMI.  Blood pressure.  Lipid and cholesterol levels. These may be checked every 5 years, or more frequently if you are over 54 years old.  Skin check.  Lung cancer screening. You may have this screening every year starting at age 7 if you have a 30-pack-year history of smoking and currently smoke or have quit within the past 15 years.  Fecal occult blood test (FOBT) of the stool. You may have this test every year starting at age 49.  Flexible sigmoidoscopy or colonoscopy. You may have a sigmoidoscopy every 5 years or a colonoscopy every 10 years starting at age 59.  Hepatitis C blood test.  Hepatitis B blood test.  Sexually transmitted disease (STD) testing.  Diabetes screening. This is done by checking your blood sugar (glucose) after you have not eaten for a while (fasting). You may have this done every 1-3 years.  Bone density scan. This is done to screen for osteoporosis. You may have this done starting at age 54.  Mammogram. This may be done every 1-2 years. Talk to your health care provider about how often you should have regular mammograms. Talk with your health care provider about your test results, treatment options, and if necessary, the need for more tests. Vaccines  Your health care provider may recommend certain vaccines, such as:  Influenza vaccine. This is recommended every year.  Tetanus, diphtheria, and acellular pertussis (Tdap, Td) vaccine. You may need a Td booster every 10 years.  Zoster vaccine. You may need this after age 36.  Pneumococcal  13-valent conjugate (PCV13) vaccine. One dose is recommended after age 42.  Pneumococcal polysaccharide (PPSV23) vaccine. One dose is recommended after age 14. Talk to your health care provider about which screenings and vaccines you need and how often you  need them. This information is not intended to replace advice given to you by your health care provider. Make sure you discuss any questions you have with your health care provider. Document Released: 11/03/2015 Document Revised: 06/26/2016 Document Reviewed: 08/08/2015 Elsevier Interactive Patient Education  2017 [place] Prevention in the Home Falls can cause injuries. They can happen to people of all ages. There are many things you can do to make your home safe and to help prevent falls. What can I do on the outside of my home?  Regularly fix the edges of walkways and driveways and fix any cracks.  Remove anything that might make you trip as you walk through a door, such as a raised step or threshold.  Trim any bushes or trees on the path to your home.  Use bright outdoor lighting.  Clear any walking paths of anything that might make someone trip, such as rocks or tools.  Regularly check to see if handrails are loose or broken. Make sure that both sides of any steps have handrails.  Any raised decks and porches should have guardrails on the edges.  Have any leaves, snow, or ice cleared regularly.  Use sand or salt on walking paths during winter.  Clean up any spills in your garage right away. This includes oil or grease spills. What can I do in the bathroom?  Use night lights.  Install grab bars by the toilet and in the tub and shower. Do not use towel bars as grab bars.  Use non-skid mats or decals in the tub or shower.  If you need to sit down in the shower, use a plastic, non-slip stool.  Keep the floor dry. Clean up any water that spills on the floor as soon as it happens.  Remove soap buildup in the tub or shower regularly.  Attach bath mats securely with double-sided non-slip rug tape.  Do not have throw rugs and other things on the floor that can make you trip. What can I do in the bedroom?  Use night lights.  Make sure that you have a light by  your bed that is easy to reach.  Do not use any sheets or blankets that are too big for your bed. They should not hang down onto the floor.  Have a firm chair that has side arms. You can use this for support while you get dressed.  Do not have throw rugs and other things on the floor that can make you trip. What can I do in the kitchen?  Clean up any spills right away.  Avoid walking on wet floors.  Keep items that you use a lot in easy-to-reach places.  If you need to reach something above you, use a strong step stool that has a grab bar.  Keep electrical cords out of the way.  Do not use floor polish or wax that makes floors slippery. If you must use wax, use non-skid floor wax.  Do not have throw rugs and other things on the floor that can make you trip. What can I do with my stairs?  Do not leave any items on the stairs.  Make sure that there are handrails on both sides of the stairs and use them. Fix  handrails that are broken or loose. Make sure that handrails are as long as the stairways.  Check any carpeting to make sure that it is firmly attached to the stairs. Fix any carpet that is loose or worn.  Avoid having throw rugs at the top or bottom of the stairs. If you do have throw rugs, attach them to the floor with carpet tape.  Make sure that you have a light switch at the top of the stairs and the bottom of the stairs. If you do not have them, ask someone to add them for you. What else can I do to help prevent falls?  Wear shoes that:  Do not have high heels.  Have rubber bottoms.  Are comfortable and fit you well.  Are closed at the toe. Do not wear sandals.  If you use a stepladder:  Make sure that it is fully opened. Do not climb a closed stepladder.  Make sure that both sides of the stepladder are locked into place.  Ask someone to hold it for you, if possible.  Clearly mark and make sure that you can see:  Any grab bars or handrails.  First and  last steps.  Where the edge of each step is.  Use tools that help you move around (mobility aids) if they are needed. These include:  Canes.  Walkers.  Scooters.  Crutches.  Turn on the lights when you go into a dark area. Replace any light bulbs as soon as they burn out.  Set up your furniture so you have a clear path. Avoid moving your furniture around.  If any of your floors are uneven, fix them.  If there are any pets around you, be aware of where they are.  Review your medicines with your doctor. Some medicines can make you feel dizzy. This can increase your chance of falling. Ask your doctor what other things that you can do to help prevent falls. This information is not intended to replace advice given to you by your health care provider. Make sure you discuss any questions you have with your health care provider. Document Released: 08/03/2009 Document Revised: 03/14/2016 Document Reviewed: 11/11/2014 Elsevier Interactive Patient Education  2017 [employer].

## 2018-08-13 ENCOUNTER — Other Ambulatory Visit: Payer: Self-pay | Admitting: "Endocrinology

## 2018-10-22 ENCOUNTER — Encounter: Payer: Self-pay | Admitting: *Deleted

## 2018-10-26 ENCOUNTER — Ambulatory Visit: Payer: Medicare Other | Admitting: Family Medicine

## 2018-10-26 ENCOUNTER — Encounter: Payer: Self-pay | Admitting: Family Medicine

## 2018-10-26 VITALS — BP 140/72 | HR 70 | Temp 98.0°F | Resp 15 | Ht 67.0 in | Wt 216.0 lb

## 2018-10-26 DIAGNOSIS — R05 Cough: Secondary | ICD-10-CM

## 2018-10-26 DIAGNOSIS — E559 Vitamin D deficiency, unspecified: Secondary | ICD-10-CM

## 2018-10-26 DIAGNOSIS — I10 Essential (primary) hypertension: Secondary | ICD-10-CM

## 2018-10-26 DIAGNOSIS — E6609 Other obesity due to excess calories: Secondary | ICD-10-CM

## 2018-10-26 DIAGNOSIS — E038 Other specified hypothyroidism: Secondary | ICD-10-CM

## 2018-10-26 DIAGNOSIS — Z6834 Body mass index (BMI) 34.0-34.9, adult: Secondary | ICD-10-CM

## 2018-10-26 DIAGNOSIS — E785 Hyperlipidemia, unspecified: Secondary | ICD-10-CM | POA: Diagnosis not present

## 2018-10-26 DIAGNOSIS — R7301 Impaired fasting glucose: Secondary | ICD-10-CM

## 2018-10-26 DIAGNOSIS — R058 Other specified cough: Secondary | ICD-10-CM

## 2018-10-26 MED ORDER — POTASSIUM CHLORIDE CRYS ER 20 MEQ PO TBCR: 20.0000 meq | EXTENDED_RELEASE_TABLET | Freq: Every day | ORAL | 1 refills | Status: DC

## 2018-10-26 MED ORDER — MONTELUKAST SODIUM 10 MG PO TABS: ORAL_TABLET | ORAL | 0 refills | Status: DC

## 2018-10-26 MED ORDER — BENZONATATE 100 MG PO CAPS: 100.0000 mg | ORAL_CAPSULE | Freq: Two times a day (BID) | ORAL | 0 refills | Status: DC | PRN

## 2018-10-26 NOTE — Progress Notes (Signed)
   Tammy Galvan     MRN: [id number]      DOB: 12-Jul-1933   HPI Tammy Galvan is here for follow up and re-evaluation of chronic medical conditions, medication management and review of any available recent lab and radiology data.  Preventive health is updated, specifically  Cancer screening and Immunization.   2 wek h/o cough and post nasal drainage, gradually improving, denies fever or chills, sputum or drainage   ROS  Denies sinus pressure,c/o  nasal congestion,denies  ear pain or sore throat.  Denies chest pains, palpitations and leg swelling Denies abdominal pain, nausea, vomiting,diarrhea or constipation.   Denies dysuria, frequency, hesitancy or incontinence. Denies joint pain, swelling and limitation in mobility. Denies headaches, seizures, numbness, or tingling. Denies depression, anxiety or insomnia. Denies skin break down or rash.   PE  BP 140/72   Pulse 70   Temp 98 F (36.7 C) (Oral)   Resp 15   Ht 5\' 7"  (1.702 m)   Wt 216 lb (98 kg)   SpO2 98%   BMI 33.83 kg/m   Patient alert and oriented and in no cardiopulmonary distress.  HEENT: No facial asymmetry, EOMI,   oropharynx pink and moist.  Neck supple no JVD, no mass.  Chest: Clear to auscultation bilaterally., no crackles or wheezes  CVS: S1, S2 no murmurs, no S3.Regular rate.  ABD: Soft non tender.   Ext: No edema  MS: Adequate though reduced ROM spine, shoulders, hips and knees.  Skin: Intact, no ulcerations or rash noted.  Psych: Good eye contact, normal affect. Memory intact not anxious or depressed appearing.  CNS: CN 2-12 intact, power,  normal throughout.no focal deficits noted.   Assessment & Plan  Allergic cough Uncontrolled, start singulair and tessalon perles also prescribed  Obesity unchanged Patient re-educated about  the importance of commitment to a  minimum of 150 minutes of exercise per week.  The importance of healthy food choices with portion control discussed. Encouraged to  start a food diary, count calories and to consider  joining a support group. Sample diet sheets offered. Goals set by the patient for the next several months.   Weight /BMI 10/26/2018 07/27/2018 06/25/2018  WEIGHT 216 lb 218 lb 218 lb 9.6 oz  HEIGHT 5\' 7"  5\' 7"  5\' 7"   BMI 33.83 kg/m2 34.14 kg/m2 34.24 kg/m2      Essential hypertension Not at goal,will need higher med dose if this persisits DASH diet and commitment to daily physical activity for a minimum of 30 minutes discussed and encouraged, as a part of hypertension management. The importance of attaining a healthy weight is also discussed.  BP/Weight 10/26/2018 07/27/2018 06/25/2018 05/05/2018 01/30/2018 12/23/2017 53/03/1442  Systolic BP 154 008 676 195 093 267 124  Diastolic BP 72 78 77 84 80 80 82  Wt. (Lbs) 216 218 218.6 216.75 220 222 218.25  BMI 33.83 34.14 34.24 34.98 34.46 34.77 34.18       Hyperlipidemia LDL goal <100 Hyperlipidemia:Low fat diet discussed and encouraged.   Lipid Panel  Lab Results  Component Value Date   CHOL 142 10/26/2018   HDL 47 (L) 10/26/2018   LDLCALC 82 10/26/2018   TRIG 53 10/26/2018   CHOLHDL 3.0 10/26/2018  needs to exercise more regularly, no med change     Other specified hypothyroidism Stable managed by endo

## 2018-10-26 NOTE — Patient Instructions (Addendum)
F/U in early May, call if you need me before  Medications have been prescribed for tickle in throat with cough, these are tessalon perles and singulair  Please check your pharmacy, shingles vaccines are recommended   Labs today, lipid, cmp and EGFr, HBA1C and vit D, you will be contacted with result in next 3 to 4 days  Keep up daily exercise , GREAT for your health  Increase vegetable and fruit, fresh / frozen, and cut back on ice cream and foods with sugar EXCEPT FRUIT   Thank you  for choosing [facility]. We consider it a privelige to serve you.  Delivering excellent health care in a caring and  compassionate way is our goal.  Partnering with you,  so that together we can achieve this goal is our strategy.    

## 2018-10-27 LAB — COMPLETE METABOLIC PANEL WITH GFR
AG Ratio: 1.2 (calc) (ref 1.0–2.5)
ALT: 12 U/L (ref 6–29)
AST: 13 U/L (ref 10–35)
Albumin: 3.4 g/dL — ABNORMAL LOW (ref 3.6–5.1)
Alkaline phosphatase (APISO): 72 U/L (ref 33–130)
BILIRUBIN TOTAL: 0.4 mg/dL (ref 0.2–1.2)
BUN: 14 mg/dL (ref 7–25)
CO2: 30 mmol/L (ref 20–32)
Calcium: 9.2 mg/dL (ref 8.6–10.4)
Chloride: 105 mmol/L (ref 98–110)
Creat: 0.72 mg/dL (ref 0.60–0.88)
GFR, Est African American: 90 mL/min/{1.73_m2} (ref >=60)
GFR, Est Non African American: 77 mL/min/{1.73_m2} (ref >=60)
Globulin: 2.9 g/dL (calc) (ref 1.9–3.7)
Glucose, Bld: 93 mg/dL (ref 65–99)
Potassium: 4 mmol/L (ref 3.5–5.3)
Sodium: 139 mmol/L (ref 135–146)
Total Protein: 6.3 g/dL (ref 6.1–8.1)

## 2018-10-27 LAB — HEMOGLOBIN A1C
Hgb A1c MFr Bld: 5.9 % of total Hgb — ABNORMAL HIGH (ref <5.7)
Mean Plasma Glucose: 123 (calc)
eAG (mmol/L): 6.8 (calc)

## 2018-10-27 LAB — VITAMIN D 25 HYDROXY (VIT D DEFICIENCY, FRACTURES): Vit D, 25-Hydroxy: 20 ng/mL — ABNORMAL LOW (ref 30–100)

## 2018-10-27 LAB — LIPID PANEL
CHOLESTEROL: 142 mg/dL (ref <200)
HDL: 47 mg/dL — ABNORMAL LOW (ref >50)
LDL CHOLESTEROL (CALC): 82 mg/dL
Non-HDL Cholesterol (Calc): 95 mg/dL (calc) (ref <130)
Total CHOL/HDL Ratio: 3 (calc) (ref <5.0)
Triglycerides: 53 mg/dL (ref <150)

## 2018-10-31 ENCOUNTER — Encounter: Payer: Self-pay | Admitting: Family Medicine

## 2018-10-31 DIAGNOSIS — R05 Cough: Secondary | ICD-10-CM | POA: Insufficient documentation

## 2018-10-31 DIAGNOSIS — R058 Other specified cough: Secondary | ICD-10-CM | POA: Insufficient documentation

## 2018-10-31 NOTE — Assessment & Plan Note (Signed)
Stable managed by endo

## 2018-10-31 NOTE — Assessment & Plan Note (Signed)
Hyperlipidemia:Low fat diet discussed and encouraged.   Lipid Panel  Lab Results  Component Value Date   CHOL 142 10/26/2018   HDL 47 (L) 10/26/2018   LDLCALC 82 10/26/2018   TRIG 53 10/26/2018   CHOLHDL 3.0 10/26/2018  needs to exercise more regularly, no med change

## 2018-10-31 NOTE — Assessment & Plan Note (Signed)
unchanged Patient re-educated about  the importance of commitment to a  minimum of 150 minutes of exercise per week.  The importance of healthy food choices with portion control discussed. Encouraged to start a food diary, count calories and to consider  joining a support group. Sample diet sheets offered. Goals set by the patient for the next several months.   Weight /BMI 10/26/2018 07/27/2018 06/25/2018  WEIGHT 216 lb 218 lb 218 lb 9.6 oz  HEIGHT 5\' 7"  5\' 7"  5\' 7"   BMI 33.83 kg/m2 34.14 kg/m2 34.24 kg/m2

## 2018-10-31 NOTE — Assessment & Plan Note (Signed)
Uncontrolled, start singulair and tessalon perles also prescribed

## 2018-10-31 NOTE — Assessment & Plan Note (Signed)
Not at goal,will need higher med dose if this persisits DASH diet and commitment to daily physical activity for a minimum of 30 minutes discussed and encouraged, as a part of hypertension management. The importance of attaining a healthy weight is also discussed.  BP/Weight 10/26/2018 07/27/2018 06/25/2018 05/05/2018 01/30/2018 12/23/2017 41/04/1277  Systolic BP 718 367 255 001 642 903 795  Diastolic BP 72 78 77 84 80 80 82  Wt. (Lbs) 216 218 218.6 216.75 220 222 218.25  BMI 33.83 34.14 34.24 34.98 34.46 34.77 34.18

## 2018-11-10 ENCOUNTER — Other Ambulatory Visit: Payer: Self-pay | Admitting: Family Medicine

## 2018-11-17 ENCOUNTER — Other Ambulatory Visit: Payer: Self-pay | Admitting: Family Medicine

## 2018-12-18 LAB — TSH: TSH: 1.5 mIU/L (ref 0.40–4.50)

## 2018-12-18 LAB — T4, FREE: Free T4: 1.4 ng/dL (ref 0.8–1.8)

## 2018-12-24 ENCOUNTER — Ambulatory Visit (INDEPENDENT_AMBULATORY_CARE_PROVIDER_SITE_OTHER): Payer: Medicare Other | Admitting: "Endocrinology

## 2018-12-24 ENCOUNTER — Encounter: Payer: Self-pay | Admitting: "Endocrinology

## 2018-12-24 VITALS — BP 150/75 | HR 63 | Ht 67.0 in | Wt 215.0 lb

## 2018-12-24 DIAGNOSIS — E042 Nontoxic multinodular goiter: Secondary | ICD-10-CM

## 2018-12-24 DIAGNOSIS — E038 Other specified hypothyroidism: Secondary | ICD-10-CM

## 2018-12-24 NOTE — Progress Notes (Signed)
HPI  Tammy Galvan is a 85 y.o.-year-old female. -Patient is here to follow-up for her hypothyroidism and multinodular goiter.  She is currently on Synthroid 75 mcg p.o. nightly.  She reports compliance with medication.  She denies any new complaints today.  She has steady weight, no palpitations, tremors, heat/cold intolerance.    -Her thyroid ultrasound is consistent with slight shrinking of bilateral thyroid lobes as well as previously documented nodules.  Left thyroid lobe nodule fine-needle aspiration in November 2016 was negative for malignancy.  ROS: Constitutional: + Steady weight, no fatigue, no subjective hypo-/hyperthermia.   Eyes: no blurry vision, no xerophthalmia ENT: no sore throat, no nodules palpated in throat, no dysphagia, no odynophagia no hoarseness Cardiovascular: No chest pain, no palpitations Musculoskeletal: no muscle/joint aches Skin: no rashes Neurological: no tremors/numbness/tingling/dizziness Psychiatric: no depression/anxiety  PE: BP (!) 150/75   Pulse 63   Ht 5\' 7"  (1.702 m)   Wt 215 lb (97.5 kg)   BMI 33.67 kg/m  Wt Readings from Last 3 Encounters:  12/24/18 215 lb (97.5 kg)  10/26/18 216 lb (98 kg)  07/27/18 218 lb (98.9 kg)    Constitutional: + Obese,  not in acute distress.   Eyes: PERRLA, EOMI, no exophthalmos ENT: moist mucous membranes, left lobe thyroid nodule is palpable, no cervical lymphadenopathy Musculoskeletal: no deformities, strength intact in all 4;  Skin: moist, warm, she has stasis dermatitis on BLE Neurological: No tremors on outstretched  Hands,  no tremor with outstretched hands, DTR normal in all 4  June 03, 2018 thyroid ultrasound IMPRESSION: 1. Normal-sized thyroid with stable solitary left nodule, previously biopsied. No indication for biopsy or dedicated imaging follow-up  August 23, 2015 fine-needle aspiration of left lobe nodule benign follicular nodule.   Recent Results (from the past 2160 hour(s))  Lipid  panel     Status: Abnormal   Collection Time: 10/26/18 10:38 AM  Result Value Ref Range   Cholesterol 142 <200 mg/dL   HDL 47 (L) >50 mg/dL   Triglycerides 53 <150 mg/dL   LDL Cholesterol (Calc) 82 mg/dL (calc)    Comment: Reference range: <100 . Desirable range <100 mg/dL for primary prevention;   <70 mg/dL for patients with CHD or diabetic patients  with > or = 2 CHD risk factors. Marland Kitchen LDL-C is now calculated using the Martin-Hopkins  calculation, which is a validated novel method providing  better accuracy than the Friedewald equation in the  estimation of LDL-C.  Cresenciano Genre et al. Annamaria Helling. 5102;585(27): 2061-2068  (http://education.[facility].com/faq/FAQ164)    Total CHOL/HDL Ratio 3.0 <5.0 (calc)   Non-HDL Cholesterol (Calc) 95 <130 mg/dL (calc)    Comment: For patients with diabetes plus 1 major ASCVD risk  factor, treating to a non-HDL-C goal of <100 mg/dL  (LDL-C of <70 mg/dL) is considered a therapeutic  option.   COMPLETE METABOLIC PANEL WITH GFR     Status: Abnormal   Collection Time: 10/26/18 10:38 AM  Result Value Ref Range   Glucose, Bld 93 65 - 99 mg/dL    Comment: .            Fasting reference interval .    BUN 14 7 - 25 mg/dL   Creat 0.72 0.60 - 0.88 mg/dL    Comment: For patients >79 years of age, the reference limit for Creatinine is approximately 13% higher for people identified as African-American. .    GFR, Est Non African American 77 > OR = 60 mL/min/1.20m2   GFR, Est African  American 90 > OR = 60 mL/min/1.59m2   BUN/Creatinine Ratio NOT APPLICABLE 6 - 22 (calc)   Sodium 139 135 - 146 mmol/L   Potassium 4.0 3.5 - 5.3 mmol/L   Chloride 105 98 - 110 mmol/L   CO2 30 20 - 32 mmol/L   Calcium 9.2 8.6 - 10.4 mg/dL   Total Protein 6.3 6.1 - 8.1 g/dL   Albumin 3.4 (L) 3.6 - 5.1 g/dL   Globulin 2.9 1.9 - 3.7 g/dL (calc)   AG Ratio 1.2 1.0 - 2.5 (calc)   Total Bilirubin 0.4 0.2 - 1.2 mg/dL   Alkaline phosphatase (APISO) 72 33 - 130 U/L   AST 13 10  - 35 U/L   ALT 12 6 - 29 U/L  Hemoglobin A1c     Status: Abnormal   Collection Time: 10/26/18 10:38 AM  Result Value Ref Range   Hgb A1c MFr Bld 5.9 (H) <5.7 % of total Hgb    Comment: For someone without known diabetes, a hemoglobin  A1c value between 5.7% and 6.4% is consistent with prediabetes and should be confirmed with a  follow-up test. . For someone with known diabetes, a value <7% indicates that their diabetes is well controlled. A1c targets should be individualized based on duration of diabetes, age, comorbid conditions, and other considerations. . This assay result is consistent with an increased risk of diabetes. . Currently, no consensus exists regarding use of hemoglobin A1c for diagnosis of diabetes for children. .    Mean Plasma Glucose 123 (calc)   eAG (mmol/L) 6.8 (calc)  VITAMIN D 25 Hydroxy (Vit-D Deficiency, Fractures)     Status: Abnormal   Collection Time: 10/26/18 10:38 AM  Result Value Ref Range   Vit D, 25-Hydroxy 20 (L) 30 - 100 ng/mL    Comment: Vitamin D Status         25-OH Vitamin D: . Deficiency:                    <20 ng/mL Insufficiency:             20 - 29 ng/mL Optimal:                 > or = 30 ng/mL . For 25-OH Vitamin D testing on patients on  D2-supplementation and patients for whom quantitation  of D2 and D3 fractions is required, the [facility](TM) 25-OH VIT D, (D2,D3), LC/MS/MS is recommended: order  code 563-100-3895 (patients >52yrs). . For more information on this test, go to: http://education.[facility].com/faq/FAQ163 (This link is being provided for  informational/educational purposes only.)   TSH     Status: None   Collection Time: 12/17/18 11:41 AM  Result Value Ref Range   TSH 1.50 0.40 - 4.50 mIU/L  T4, free     Status: None   Collection Time: 12/17/18 11:41 AM  Result Value Ref Range   Free T4 1.4 0.8 - 1.8 ng/dL    ASSESSMENT: 1. MNG 2. hypothyroidism  PLAN: -Her previsit thyroid function tests are  consistent with appropriate replacement. -She is advised to continue Synthroid 75 mcg p.o. every morning.     - We discussed about the correct intake of her thyroid hormone, on empty stomach at fasting, with water, separated by at least 30 minutes from breakfast and other medications,  and separated by more than 4 hours from calcium, iron, multivitamins, acid reflux medications (PPIs). -Patient is made aware of the fact that thyroid hormone replacement is needed for life,  dose to be adjusted by periodic monitoring of thyroid function tests.   -She is status post fine needle aspiration which was reported to be benign follicular nodule in November 2016 -[name].  Her thyroid ultrasound from June 03, 2018 is consistent with shrinking of bilateral thyroid lobes and previously biopsied nodules.  She will not need any further intervention or follow-up ultrasound.   I advised her to maintain close follow up with her primary care physician for her primary care needs.   Glade Lloyd, MD  Tel. [phone], Fax [phone]  -  This note was partially dictated with voice recognition software. Similar sounding words can be transcribed inadequately or may not  be corrected upon review.

## 2018-12-24 NOTE — Progress Notes (Signed)
LeighAnn Ashanty Coltrane, CMA  

## 2019-02-05 ENCOUNTER — Other Ambulatory Visit: Payer: Self-pay | Admitting: Family Medicine

## 2019-02-18 ENCOUNTER — Encounter: Payer: Self-pay | Admitting: Family Medicine

## 2019-02-18 ENCOUNTER — Other Ambulatory Visit: Payer: Self-pay

## 2019-02-18 ENCOUNTER — Ambulatory Visit (INDEPENDENT_AMBULATORY_CARE_PROVIDER_SITE_OTHER): Payer: Medicare Other | Admitting: Family Medicine

## 2019-02-18 VITALS — BP 150/75 | Ht 67.0 in | Wt 216.0 lb

## 2019-02-18 DIAGNOSIS — E6609 Other obesity due to excess calories: Secondary | ICD-10-CM

## 2019-02-18 DIAGNOSIS — L089 Local infection of the skin and subcutaneous tissue, unspecified: Secondary | ICD-10-CM

## 2019-02-18 DIAGNOSIS — I10 Essential (primary) hypertension: Secondary | ICD-10-CM | POA: Diagnosis not present

## 2019-02-18 DIAGNOSIS — E785 Hyperlipidemia, unspecified: Secondary | ICD-10-CM

## 2019-02-18 DIAGNOSIS — Z6833 Body mass index (BMI) 33.0-33.9, adult: Secondary | ICD-10-CM

## 2019-02-18 DIAGNOSIS — L723 Sebaceous cyst: Secondary | ICD-10-CM

## 2019-02-18 DIAGNOSIS — E038 Other specified hypothyroidism: Secondary | ICD-10-CM

## 2019-02-18 DIAGNOSIS — R7301 Impaired fasting glucose: Secondary | ICD-10-CM

## 2019-02-18 MED ORDER — PRAVASTATIN SODIUM 10 MG PO TABS: 10.0000 mg | ORAL_TABLET | Freq: Every day | ORAL | 3 refills | Status: DC

## 2019-02-18 MED ORDER — CEPHALEXIN 500 MG PO CAPS: 500.0000 mg | ORAL_CAPSULE | Freq: Four times a day (QID) | ORAL | 0 refills | Status: DC

## 2019-02-18 MED ORDER — POTASSIUM CHLORIDE CRYS ER 20 MEQ PO TBCR: 20.0000 meq | EXTENDED_RELEASE_TABLET | Freq: Every day | ORAL | 3 refills | Status: DC

## 2019-02-18 MED ORDER — AMLODIPINE BESYLATE 10 MG PO TABS: 10.0000 mg | ORAL_TABLET | Freq: Every day | ORAL | 3 refills | Status: DC

## 2019-02-18 MED ORDER — IBUPROFEN 600 MG PO TABS: 600.0000 mg | ORAL_TABLET | Freq: Three times a day (TID) | ORAL | 0 refills | Status: DC | PRN

## 2019-02-18 NOTE — Progress Notes (Signed)
Virtual Visit via Telephone Note  I connected with Tammy Galvan on 02/18/19 at  9:20 AM EDT by telephone and verified that I am speaking with the correct person using two identifiers.  Location: Patient: home Provider: office   I discussed the limitations, risks, security and privacy concerns of performing an evaluation and management service by telephone and the availability of in person appointments. I also discussed with the patient that there may be a patient responsible charge related to this service. The patient expressed understanding and agreed to proceed.   History of Present Illness: F/U chronic problems and lab review Cyst on right shoulder, the 3rd year, noted for 2 weeks, about the size of a dime, has resolved in the past with antibiotic, no fever , chills or redness  Increased pain, states this helps for arhtritic pain , which could be in knees , back and hips,esp the right hip, no instability Denies recent fever or chills. Denies sinus pressure, nasal congestion, ear pain or sore throat. Denies chest congestion, productive cough or wheezing. Denies chest pains, palpitations and leg swelling Denies abdominal pain, nausea, vomiting,diarrhea or constipation.   Denies dysuria, frequency, hesitancy or incontinence. Denies uncontrolled joint pain, swelling and limitation in mobility. Denies headaches, seizures, numbness, or tingling. Denies depression, anxiety or insomnia. .       Observations/Objective: BP (!) 150/75   Ht 5\' 7"  (1.702 m)   Wt 216 lb (98 kg)   SpO2 98%   BMI 33.83 kg/m  Good communication with no confusion and intact memory. Alert and oriented x 3 No signs of respiratory distress during sppech    Assessment and Plan:  Infected sebaceous cyst Recurrent cyst on right shoulder, will treat with 1 week of antibiotic, if worsens will need surgery eval  Essential hypertension Not at goal DASH diet and commitment to daily physical activity for a  minimum of 30 minutes discussed and encouraged, as a part of hypertension management. The importance of attaining a healthy weight is also discussed.  BP/Weight 02/18/2019 12/24/2018 10/26/2018 07/27/2018 06/25/2018 05/05/2018 2/53/6644  Systolic BP 034 742 595 638 756 433 295  Diastolic BP 75 75 72 78 77 84 80  Wt. (Lbs) 216 215 216 218 218.6 216.75 220  BMI 33.83 33.67 33.83 34.14 34.24 34.98 34.46     Re eval in office   Hyperlipidemia LDL goal <100 Hyperlipidemia:Low fat diet discussed and encouraged.   Lipid Panel  Lab Results  Component Value Date   CHOL 142 10/26/2018   HDL 47 (L) 10/26/2018   LDLCALC 82 10/26/2018   TRIG 53 10/26/2018   CHOLHDL 3.0 10/26/2018   Well controlled , needs to inc exercise    Obesity Deteriorated.  Patient re-educated about  the importance of commitment to a  minimum of 150 minutes of exercise per week as able.  The importance of healthy food choices with portion control discussed, as well as eating regularly and within a 12 hour window most days. The need to choose "clean , green" food 50 to 75% of the time is discussed, as well as to make water the primary drink and set a goal of 64 ounces water daily.  Encouraged to start a food diary,  and to consider  joining a support group. Sample diet sheets offered. Goals set by the patient for the next several months.   Weight /BMI 02/18/2019 12/24/2018 10/26/2018  WEIGHT 216 lb 215 lb 216 lb  HEIGHT 5\' 7"  5\' 7"  5\' 7"   BMI  33.83 kg/m2 33.67 kg/m2 33.83 kg/m2      Other specified hypothyroidism Managed by endo and controlled   Follow Up Instructions:    I discussed the assessment and treatment plan with the patient. The patient was provided an opportunity to ask questions and all were answered. The patient agreed with the plan and demonstrated an understanding of the instructions.   The patient was advised to call back or seek an in-person evaluation if the symptoms worsen or if the condition  fails to improve as anticipated.  I provided 22 minutes of non-face-to-face time during this encounter.   Tula Nakayama, MD

## 2019-02-18 NOTE — Patient Instructions (Addendum)
Annual physical exm July 20 or after, call if you need me before  Antibiotic and ibuprofen are prescribed as discussed  Toolkit information sheet for coping with being home bound is sent  Fasting lipid, cmp and EGFR , HBA1C and cBC 1 week before July visit  It is important that you exercise regularly at least 30 minutes 5 times a week. If you develop chest pain, have severe difficulty breathing, or feel very tired, stop exercising immediately and seek medical attention   Social distancing. Frequent hand washing with soap and water Keeping your hands off of your face.Wear a mask and  M,aintain 6 ft distance These 3 practices will help to keep both you and your community healthy during this time. Please practice them faithfully!  Think about what you will eat, plan ahead. Choose " clean, green, fresh or frozen" over canned, processed or packaged foods which are more sugary, salty and fatty. 70 to 75% of food eaten should be vegetables and fruit. Three meals at set times with snacks allowed between meals, but they must be fruit or vegetables. Aim to eat over a 12 hour period , example 7 am to 7 pm, and STOP after  your last meal of the day. Drink water,generally about 64 ounces per day, no other drink is as healthy. Fruit juice is best enjoyed in a healthy way, by EATING the fruit.  Thanks for choosing [facility], we consider it a privelige to serve you.

## 2019-02-19 ENCOUNTER — Other Ambulatory Visit: Payer: Self-pay | Admitting: Family Medicine

## 2019-02-19 ENCOUNTER — Telehealth: Payer: Self-pay | Admitting: *Deleted

## 2019-02-19 MED ORDER — CEPHALEXIN 500 MG PO CAPS: 500.0000 mg | ORAL_CAPSULE | Freq: Two times a day (BID) | ORAL | 0 refills | Status: AC

## 2019-02-19 NOTE — Telephone Encounter (Signed)
Please advise 

## 2019-02-19 NOTE — Telephone Encounter (Signed)
I tried calling both numbers, no answer, pls try to call her back, I did leave a message BUT really needs to hear directly, let me know if not able CORRECT direction is one capsule two times daily for 1 week, I did send it with the incorrect directions unfortunately

## 2019-02-19 NOTE — Telephone Encounter (Signed)
Called patient to let her know correct dosing instructions. No answer. Left voicemail with correct dosing instructions. Will try again later.

## 2019-02-19 NOTE — Telephone Encounter (Signed)
Pt called cephalexin was prescribed yesterday by Dr. Moshe Cipro. She picked these up from [facility] but she only got 14 pills. The directions told her to take it four times a day for two weeks. Pt wanted to make sure she was doing the right thing as she didn't have enough pills for 4 times a day for two weeks.

## 2019-02-21 NOTE — Assessment & Plan Note (Signed)
Recurrent cyst on right shoulder, will treat with 1 week of antibiotic, if worsens will need surgery eval

## 2019-02-21 NOTE — Assessment & Plan Note (Signed)
Managed by endo and controlled 

## 2019-02-21 NOTE — Assessment & Plan Note (Signed)
Deteriorated.  Patient re-educated about  the importance of commitment to a  minimum of 150 minutes of exercise per week as able.  The importance of healthy food choices with portion control discussed, as well as eating regularly and within a 12 hour window most days. The need to choose "clean , green" food 50 to 75% of the time is discussed, as well as to make water the primary drink and set a goal of 64 ounces water daily.  Encouraged to start a food diary,  and to consider  joining a support group. Sample diet sheets offered. Goals set by the patient for the next several months.   Weight /BMI 02/18/2019 12/24/2018 10/26/2018  WEIGHT 216 lb 215 lb 216 lb  HEIGHT 5\' 7"  5\' 7"  5\' 7"   BMI 33.83 kg/m2 33.67 kg/m2 33.83 kg/m2

## 2019-02-21 NOTE — Assessment & Plan Note (Signed)
Hyperlipidemia:Low fat diet discussed and encouraged.   Lipid Panel  Lab Results  Component Value Date   CHOL 142 10/26/2018   HDL 47 (L) 10/26/2018   LDLCALC 82 10/26/2018   TRIG 53 10/26/2018   CHOLHDL 3.0 10/26/2018   Well controlled , needs to inc exercise

## 2019-02-21 NOTE — Assessment & Plan Note (Signed)
Not at goal DASH diet and commitment to daily physical activity for a minimum of 30 minutes discussed and encouraged, as a part of hypertension management. The importance of attaining a healthy weight is also discussed.  BP/Weight 02/18/2019 12/24/2018 10/26/2018 07/27/2018 06/25/2018 05/05/2018 2/56/7209  Systolic BP 198 022 179 810 254 862 824  Diastolic BP 75 75 72 78 77 84 80  Wt. (Lbs) 216 215 216 218 218.6 216.75 220  BMI 33.83 33.67 33.83 34.14 34.24 34.98 34.46     Re eval in office

## 2019-02-23 NOTE — Telephone Encounter (Signed)
FYI: Spoke with patient and she did get the messages with the new dosing directions. She had not started the medication yet. She didn't start the medication until Sunday.

## 2019-02-24 ENCOUNTER — Ambulatory Visit: Payer: Medicare Other | Admitting: Family Medicine

## 2019-05-07 ENCOUNTER — Other Ambulatory Visit: Payer: Self-pay | Admitting: "Endocrinology

## 2019-05-14 LAB — HEMOGLOBIN A1C
Hgb A1c MFr Bld: 5.7 % of total Hgb — ABNORMAL HIGH (ref <5.7)
Mean Plasma Glucose: 117 (calc)
eAG (mmol/L): 6.5 (calc)

## 2019-05-14 LAB — CBC
HCT: 42.6 % (ref 35.0–45.0)
Hemoglobin: 13.9 g/dL (ref 11.7–15.5)
MCH: 26.9 pg — ABNORMAL LOW (ref 27.0–33.0)
MCHC: 32.6 g/dL (ref 32.0–36.0)
MCV: 82.4 fL (ref 80.0–100.0)
MPV: 10.5 fL (ref 7.5–12.5)
Platelets: 307 10*3/uL (ref 140–400)
RBC: 5.17 10*6/uL — ABNORMAL HIGH (ref 3.80–5.10)
RDW: 13.9 % (ref 11.0–15.0)
WBC: 7 10*3/uL (ref 3.8–10.8)

## 2019-05-14 LAB — COMPLETE METABOLIC PANEL WITH GFR
AG Ratio: 1.4 (calc) (ref 1.0–2.5)
ALT: 13 U/L (ref 6–29)
AST: 15 U/L (ref 10–35)
Albumin: 3.7 g/dL (ref 3.6–5.1)
Alkaline phosphatase (APISO): 71 U/L (ref 37–153)
BUN: 12 mg/dL (ref 7–25)
CO2: 26 mmol/L (ref 20–32)
Calcium: 9.3 mg/dL (ref 8.6–10.4)
Chloride: 107 mmol/L (ref 98–110)
Creat: 0.75 mg/dL (ref 0.60–0.88)
GFR, Est African American: 85 mL/min/{1.73_m2} (ref >=60)
GFR, Est Non African American: 74 mL/min/{1.73_m2} (ref >=60)
Globulin: 2.7 g/dL (calc) (ref 1.9–3.7)
Glucose, Bld: 89 mg/dL (ref 65–99)
Potassium: 4.2 mmol/L (ref 3.5–5.3)
Sodium: 141 mmol/L (ref 135–146)
Total Bilirubin: 0.4 mg/dL (ref 0.2–1.2)
Total Protein: 6.4 g/dL (ref 6.1–8.1)

## 2019-05-14 LAB — LIPID PANEL
Cholesterol: 147 mg/dL (ref <200)
HDL: 48 mg/dL — ABNORMAL LOW (ref >=50)
LDL Cholesterol (Calc): 84 mg/dL (calc)
Non-HDL Cholesterol (Calc): 99 mg/dL (calc) (ref <130)
Total CHOL/HDL Ratio: 3.1 (calc) (ref <5.0)
Triglycerides: 73 mg/dL (ref <150)

## 2019-05-20 ENCOUNTER — Other Ambulatory Visit: Payer: Self-pay

## 2019-05-20 ENCOUNTER — Encounter: Payer: Self-pay | Admitting: Family Medicine

## 2019-05-20 ENCOUNTER — Ambulatory Visit (INDEPENDENT_AMBULATORY_CARE_PROVIDER_SITE_OTHER): Payer: Medicare Other | Admitting: Family Medicine

## 2019-05-20 VITALS — BP 126/74 | HR 56 | Temp 97.5°F | Resp 15 | Ht 67.0 in | Wt 218.0 lb

## 2019-05-20 DIAGNOSIS — Z1231 Encounter for screening mammogram for malignant neoplasm of breast: Secondary | ICD-10-CM

## 2019-05-20 DIAGNOSIS — E559 Vitamin D deficiency, unspecified: Secondary | ICD-10-CM

## 2019-05-20 DIAGNOSIS — E6609 Other obesity due to excess calories: Secondary | ICD-10-CM

## 2019-05-20 DIAGNOSIS — Z0000 Encounter for general adult medical examination without abnormal findings: Secondary | ICD-10-CM | POA: Diagnosis not present

## 2019-05-20 DIAGNOSIS — Z6834 Body mass index (BMI) 34.0-34.9, adult: Secondary | ICD-10-CM

## 2019-05-20 NOTE — Assessment & Plan Note (Signed)
  Patient re-educated about  the importance of commitment to a  minimum of 150 minutes of exercise per week as able.  The importance of healthy food choices with portion control discussed, as well as eating regularly and within a 12 hour window most days. The need to choose "clean , green" food 50 to 75% of the time is discussed, as well as to make water the primary drink and set a goal of 64 ounces water daily.    Weight /BMI 05/20/2019 02/18/2019 12/24/2018  WEIGHT 218 lb 216 lb 215 lb  HEIGHT 5\' 7"  5\' 7"  5\' 7"   BMI 34.14 kg/m2 33.83 kg/m2 33.67 kg/m2

## 2019-05-20 NOTE — Patient Instructions (Addendum)
Keep wellness visit in October as before.  Please schedule mammogram at checkout.  Follow-up with MD end January.  Vitamin D to be obtained 1 week before follow-up  Increase exercise to 45 minutes daily and continue good eating habits.  Labs and exam are excellent.  Thanks for choosing [facility], we consider it a privelige to serve you.

## 2019-05-20 NOTE — Assessment & Plan Note (Signed)

## 2019-05-20 NOTE — Progress Notes (Signed)
    Tammy Galvan     MRN: [id number]      DOB: Aug 12, 1933  HPI: Patient is in for annual physical exam. No other health concerns are expressed or addressed at the visit. Recent labs,  are reviewed. Immunization is reviewed , and  updated if needed.   PE: BP 126/74   Pulse (!) 56   Temp (!) 97.5 F (36.4 C) (Temporal)   Resp 15   Ht 5\' 7"  (1.702 m)   Wt 218 lb (98.9 kg)   SpO2 96%   BMI 34.14 kg/m   Pleasant  female, alert and oriented x 3, in no cardio-pulmonary distress. Afebrile. HEENT No facial trauma or asymetry. Sinuses non tender.  Extra occullar muscles intact,  External ears normal Neck: supple, no adenopathy,JVD or thyromegaly.No bruits.  Chest: Clear to ascultation bilaterally.No crackles or wheezes. Non tender to palpation  Breast: Not examined  Cardiovascular system; Heart sounds normal,  S1 and  S2 ,no S3.  No murmur, or thrill. Apical beat not displaced Peripheral pulses normal.  Abdomen: Soft, non tender, No guarding, tenderness or rebound.     Musculoskeletal exam: Decreased though adequate  ROM of spine, hips , shoulders and knees.  mild  deformity ,swelling and  crepitus noted in knees No muscle wasting or atrophy.   Neurologic: Cranial nerves 2 to 12 intact. Power, tone ,sensation and reflexes normal throughout. No disturbance in gait. No tremor.  Skin: Intact, no ulceration, erythema , scaling or rash noted. Pigmentation normal throughout  Psych; Normal mood and affect. Judgement and concentration normal   Assessment & Plan:  Annual physical exam Annual exam as documented. Counseling done  re healthy lifestyle involving commitment to 150 minutes exercise per week, heart healthy diet, and attaining healthy weight.The importance of adequate sleep also discussed. Regular seat belt use and home safety, is also discussed. Changes in health habits are decided on by the patient with goals and time frames  set for achieving them.  Immunization and cancer screening needs are specifically addressed at this visit.   Obesity  Patient re-educated about  the importance of commitment to a  minimum of 150 minutes of exercise per week as able.  The importance of healthy food choices with portion control discussed, as well as eating regularly and within a 12 hour window most days. The need to choose "clean , green" food 50 to 75% of the time is discussed, as well as to make water the primary drink and set a goal of 64 ounces water daily.    Weight /BMI 05/20/2019 02/18/2019 12/24/2018  WEIGHT 218 lb 216 lb 215 lb  HEIGHT 5\' 7"  5\' 7"  5\' 7"   BMI 34.14 kg/m2 33.83 kg/m2 33.67 kg/m2

## 2019-06-02 ENCOUNTER — Other Ambulatory Visit: Payer: Self-pay

## 2019-06-02 ENCOUNTER — Ambulatory Visit (HOSPITAL_COMMUNITY)
Admission: RE | Admit: 2019-06-02 | Discharge: 2019-06-02 | Disposition: A | Discharge status: Home / Self Care | Payer: Medicare Other | Source: Ambulatory Visit | Attending: Family Medicine | Admitting: Family Medicine

## 2019-06-02 DIAGNOSIS — Z1231 Encounter for screening mammogram for malignant neoplasm of breast: Secondary | ICD-10-CM | POA: Diagnosis present

## 2019-07-21 ENCOUNTER — Ambulatory Visit (INDEPENDENT_AMBULATORY_CARE_PROVIDER_SITE_OTHER): Payer: Medicare Other

## 2019-07-21 ENCOUNTER — Other Ambulatory Visit: Payer: Self-pay

## 2019-07-21 ENCOUNTER — Encounter (INDEPENDENT_AMBULATORY_CARE_PROVIDER_SITE_OTHER): Payer: Self-pay

## 2019-07-21 DIAGNOSIS — Z23 Encounter for immunization: Secondary | ICD-10-CM

## 2019-08-03 ENCOUNTER — Ambulatory Visit: Payer: Medicare Other

## 2019-08-06 ENCOUNTER — Ambulatory Visit (INDEPENDENT_AMBULATORY_CARE_PROVIDER_SITE_OTHER): Payer: Medicare Other | Admitting: Family Medicine

## 2019-08-06 ENCOUNTER — Encounter: Payer: Self-pay | Admitting: Family Medicine

## 2019-08-06 ENCOUNTER — Other Ambulatory Visit: Payer: Self-pay

## 2019-08-06 ENCOUNTER — Ambulatory Visit: Payer: Medicare Other

## 2019-08-06 VITALS — BP 126/74 | HR 56 | Temp 97.5°F | Resp 15 | Ht 68.0 in | Wt 215.0 lb

## 2019-08-06 DIAGNOSIS — Z0000 Encounter for general adult medical examination without abnormal findings: Secondary | ICD-10-CM | POA: Diagnosis not present

## 2019-08-06 NOTE — Progress Notes (Signed)
Subjective:   Tammy Galvan is a 85 y.o. female who presents for Medicare Annual (Subsequent) preventive examination.  Location of Patient: Home Location of Provider: Telehealth Consent was obtain for visit to be over via telehealth.  I verified that I am speaking with the correct person using two identifiers.   Review of Systems:   Cardiac Risk Factors include: advanced age (>94men, >45 women);dyslipidemia;hypertension;obesity (BMI >30kg/m2)     Objective:     Vitals: Resp 15   Ht 5\' 8"  (1.727 m)   Wt 215 lb (97.5 kg)   BMI 32.69 kg/m   Body mass index is 32.69 kg/m.  Advanced Directives 08/06/2019 02/17/2017 07/01/2012  Does Patient Have a Medical Advance Directive? No No Patient does not have advance directive;Patient would not like information  Would patient like information on creating a medical advance directive? No - Patient declined Yes (MAU/Ambulatory/Procedural Areas - Information given) -    Tobacco Social History   Tobacco Use  Smoking Status Never Smoker  Smokeless Tobacco Never Used     Counseling given: Not Answered   Clinical Intake:  Pre-visit preparation completed: No        Nutritional Status: BMI > 30  Obese Diabetes: No  How often do you need to have someone help you when you read instructions, pamphlets, or other written materials from your doctor or pharmacy?: 1 - Never What is the last grade level you completed in school?: 16 years  Interpreter Needed?: No     Past Medical History:  Diagnosis Date  . Glaucoma 2010  . Hyperlipidemia   . Hypertension   . Obesity   . Prediabetes 2013  . Thyroid disease   . TIA (transient ischemic attack)    Past Surgical History:  Procedure Laterality Date  . ABDOMINAL HYSTERECTOMY  1986  . CATARACT EXTRACTION    . COLONOSCOPY  07/01/2012   Procedure: COLONOSCOPY;  Surgeon: Rogene Houston, MD;  Location: [facility];  Service: Endoscopy;  Laterality: N/A;  830  . EYE SURGERY     bilateral cataract exrtaction  . FRACTURE SURGERY     right ankle and left ankle    Family History  Problem Relation Age of Onset  . Arthritis Mother   . Diabetes Father   . Heart disease Father   . Stroke Sister   . Diabetes Sister   . Pulmonary embolism Sister   . Kidney disease Brother        on dialysis  . Kidney failure Brother    Social History   Socioeconomic History  . Marital status: Widowed    Spouse name: Not on file  . Number of children: Not on file  . Years of education: Not on file  . Highest education level: Not on file  Occupational History  . Not on file  Social Needs  . Financial resource strain: Not on file  . Food insecurity    Worry: Not on file    Inability: Not on file  . Transportation needs    Medical: Not on file    Non-medical: Not on file  Tobacco Use  . Smoking status: Never Smoker  . Smokeless tobacco: Never Used  Substance and Sexual Activity  . Alcohol use: No  . Drug use: No  . Sexual activity: Never    Birth control/protection: Surgical  Lifestyle  . Physical activity    Days per week: Not on file    Minutes per session: Not on file  .  Stress: Not on file  Relationships  . Social Herbalist on phone: Not on file    Gets together: Not on file    Attends religious service: Not on file    Active member of club or organization: Not on file    Attends meetings of clubs or organizations: Not on file    Relationship status: Not on file  Other Topics Concern  . Not on file  Social History Narrative  . Not on file    Outpatient Encounter Medications as of 08/06/2019  Medication Sig  . amLODipine (NORVASC) 10 MG tablet Take 1 tablet (10 mg total) by mouth daily.  Marland Kitchen aspirin 325 MG tablet Take 325 mg by mouth daily.    . brimonidine-timolol (COMBIGAN) 0.2-0.5 % ophthalmic solution Place 1 drop into both eyes 2 (two) times daily.  . hydrochlorothiazide (HYDRODIURIL) 25 MG tablet TAKE 1 TABLET BY MOUTH EVERY DAY  .  ibuprofen (ADVIL) 600 MG tablet Take 1 tablet (600 mg total) by mouth every 8 (eight) hours as needed.  . metoprolol succinate (TOPROL-XL) 25 MG 24 hr tablet TAKE 1 TABLET BY MOUTH EVERY DAY  . potassium chloride SA (KLOR-CON M20) 20 MEQ tablet Take 1 tablet (20 mEq total) by mouth daily.  . pravastatin (PRAVACHOL) 10 MG tablet Take 1 tablet (10 mg total) by mouth daily.  Marland Kitchen SYNTHROID 75 MCG tablet TAKE 1 TABLET BY MOUTH EVERY DAY BEFORE BREAKFAST   Facility-Administered Encounter Medications as of 08/06/2019  Medication  . methylPREDNISolone acetate (DEPO-MEDROL) injection 80 mg    Activities of Daily Living In your present state of health, do you have any difficulty performing the following activities: 08/06/2019  Hearing? Y  Comment hearing aids  Vision? N  Difficulty concentrating or making decisions? N  Walking or climbing stairs? N  Dressing or bathing? N  Doing errands, shopping? N  Some recent data might be hidden    Patient Care Team: Fayrene Helper, MD as PCP - General    Assessment:   This is a routine wellness examination for Tammy Galvan.  Exercise Activities and Dietary recommendations Current Exercise Habits: The patient has a physically strenuous job, but has no regular exercise apart from work.;Home exercise routine, Type of exercise: Other - see comments(stationary bike), Time (Minutes): 30, Frequency (Times/Week): 7, Weekly Exercise (Minutes/Week): 210, Intensity: Mild, Exercise limited by: cardiac condition(s);orthopedic condition(s)  Goals    . Have 3 meals a day     Recommend eating 3 balanced meals a day and cutting back on sweets.       Fall Risk Fall Risk  08/06/2019 05/20/2019 02/18/2019 02/18/2019 10/26/2018  Falls in the past year? 0 0 0 0 0  Number falls in past yr: 0 0 0 0 -  Injury with Fall? 0 0 0 0 -  Risk for fall due to : - - - - -  Follow up Falls evaluation completed;Education provided - - - -   Is the patient's home free of loose throw rugs  in walkways, pet beds, electrical cords, etc?   yes      Grab bars in the bathroom? no      Handrails on the stairs?   yes      Adequate lighting?   yes   Depression Screen PHQ 2/9 Scores 08/06/2019 05/20/2019 10/26/2018 07/27/2018  PHQ - 2 Score 0 0 1 0  PHQ- 9 Score - - 4 -     Cognitive Function  6CIT Screen 08/06/2019 07/27/2018 02/17/2017  What Year? 0 points 0 points 0 points  What month? 0 points 0 points 0 points  What time? 0 points 0 points 0 points  Count back from 20 0 points 0 points 0 points  Months in reverse 0 points 0 points 0 points  Repeat phrase 0 points 0 points 0 points  Total Score 0 0 0    Immunization History  Administered Date(s) Administered  . Fluad Quad(high Dose 65+) 07/21/2019  . Influenza Split 08/30/2011, 07/08/2012  . Influenza Whole 08/05/2007, 06/26/2010  . Influenza, High Dose Seasonal PF 07/27/2018  . Influenza,inj,Quad PF,6+ Mos 07/15/2013, 06/14/2014, 10/19/2015, 08/08/2016, 07/29/2017  . Pneumococcal Conjugate-13 06/15/2015  . Pneumococcal Polysaccharide-23 04/03/2009  . Td 06/18/2007    Qualifies for Shingles Vaccine? Checking coverage  Screening Tests Health Maintenance  Topic Date Due  . TETANUS/TDAP  09/01/2019 (Originally 06/17/2017)  . INFLUENZA VACCINE  Completed  . DEXA SCAN  Completed  . PNA vac Low Risk Adult  Completed    Cancer Screenings: Lung: Low Dose CT Chest recommended if Age 66-80 years, 30 pack-year currently smoking OR have quit w/in 15years. Patient does not qualify. Breast:  Up to date on Mammogram? Yes   Up to date of Bone Density/Dexa? Yes Colorectal: n/a  Additional Screenings:  Hepatitis C Screening: completed     Plan:      1. Encounter for Medicare annual wellness exam  I have personally reviewed and noted the following in the patient's chart:   . Medical and social history . Use of alcohol, tobacco or illicit drugs  . Current medications and supplements . Functional ability and  status . Nutritional status . Physical activity . Advanced directives . List of other physicians . Hospitalizations, surgeries, and ER visits in previous 12 months . Vitals . Screenings to include cognitive, depression, and falls . Referrals and appointments  In addition, I have reviewed and discussed with patient certain preventive protocols, quality metrics, and best practice recommendations. A written personalized care plan for preventive services as well as general preventive health recommendations were provided to patient.    I provided 20 minutes of non-face-to-face time during this encounter.   Perlie Mayo, NP  08/06/2019

## 2019-08-06 NOTE — Patient Instructions (Signed)
Tammy Galvan , Thank you for taking time to come for your Medicare Wellness Visit. I appreciate your ongoing commitment to your health goals. Please review the following plan we discussed and let me know if I can assist you in the future.   Please continue to practice social distancing to keep you, your family, and our community safe.  If you must go out, please wear a Mask and practice good handwashing.  Screening recommendations/referrals: Colonoscopy: no longer needed Mammogram: up to date Bone Density: up to date Recommended yearly ophthalmology/optometry visit for glaucoma screening and checkup Recommended yearly dental visit for hygiene and checkup  Vaccinations: Influenza vaccine: completed this years, due again Fall 2021 Pneumococcal vaccine: completed Tdap vaccine: Up to date until 08/2019 Shingles vaccine: check coverage  Advanced directives: you report you are working on this with your children. I am thankful you are completing this paperwork.  Conditions/risks identified: Falls  Next appointment: 11/18/2019  Preventive Care 65 Years and Older, Female Preventive care refers to lifestyle choices and visits with your health care provider that can promote health and wellness. What does preventive care include?  A yearly physical exam. This is also called an annual well check.  Dental exams once or twice a year.  Routine eye exams. Ask your health care provider how often you should have your eyes checked.  Personal lifestyle choices, including:  Daily care of your teeth and gums.  Regular physical activity.  Eating a healthy diet.  Avoiding tobacco and drug use.  Limiting alcohol use.  Practicing safe sex.  Taking low-dose aspirin every day.  Taking vitamin and mineral supplements as recommended by your health care provider. What happens during an annual well check? The services and screenings done by your health care provider during your annual well check  will depend on your age, overall health, lifestyle risk factors, and family history of disease. Counseling  Your health care provider may ask you questions about your:  Alcohol use.  Tobacco use.  Drug use.  Emotional well-being.  Home and relationship well-being.  Sexual activity.  Eating habits.  History of falls.  Memory and ability to understand (cognition).  Work and work Statistician.  Reproductive health. Screening  You may have the following tests or measurements:  Height, weight, and BMI.  Blood pressure.  Lipid and cholesterol levels. These may be checked every 5 years, or more frequently if you are over 58 years old.  Skin check.  Lung cancer screening. You may have this screening every year starting at age 44 if you have a 30-pack-year history of smoking and currently smoke or have quit within the past 15 years.  Fecal occult blood test (FOBT) of the stool. You may have this test every year starting at age 53.  Flexible sigmoidoscopy or colonoscopy. You may have a sigmoidoscopy every 5 years or a colonoscopy every 10 years starting at age 24.  Hepatitis C blood test.  Hepatitis B blood test.  Sexually transmitted disease (STD) testing.  Diabetes screening. This is done by checking your blood sugar (glucose) after you have not eaten for a while (fasting). You may have this done every 1-3 years.  Bone density scan. This is done to screen for osteoporosis. You may have this done starting at age 40.  Mammogram. This may be done every 1-2 years. Talk to your health care provider about how often you should have regular mammograms. Talk with your health care provider about your test results, treatment options, and if  necessary, the need for more tests. Vaccines  Your health care provider may recommend certain vaccines, such as:  Influenza vaccine. This is recommended every year.  Tetanus, diphtheria, and acellular pertussis (Tdap, Td) vaccine. You may  need a Td booster every 10 years.  Zoster vaccine. You may need this after age 57.  Pneumococcal 13-valent conjugate (PCV13) vaccine. One dose is recommended after age 74.  Pneumococcal polysaccharide (PPSV23) vaccine. One dose is recommended after age 23. Talk to your health care provider about which screenings and vaccines you need and how often you need them. This information is not intended to replace advice given to you by your health care provider. Make sure you discuss any questions you have with your health care provider. Document Released: 11/03/2015 Document Revised: 06/26/2016 Document Reviewed: 08/08/2015 Elsevier Interactive Patient Education  2017 [employer] Prevention in the Home Falls can cause injuries. They can happen to people of all ages. There are many things you can do to make your home safe and to help prevent falls. What can I do on the outside of my home?  Regularly fix the edges of walkways and driveways and fix any cracks.  Remove anything that might make you trip as you walk through a door, such as a raised step or threshold.  Trim any bushes or trees on the path to your home.  Use bright outdoor lighting.  Clear any walking paths of anything that might make someone trip, such as rocks or tools.  Regularly check to see if handrails are loose or broken. Make sure that both sides of any steps have handrails.  Any raised decks and porches should have guardrails on the edges.  Have any leaves, snow, or ice cleared regularly.  Use sand or salt on walking paths during winter.  Clean up any spills in your garage right away. This includes oil or grease spills. What can I do in the bathroom?  Use night lights.  Install grab bars by the toilet and in the tub and shower. Do not use towel bars as grab bars.  Use non-skid mats or decals in the tub or shower.  If you need to sit down in the shower, use a plastic, non-slip stool.  Keep the floor  dry. Clean up any water that spills on the floor as soon as it happens.  Remove soap buildup in the tub or shower regularly.  Attach bath mats securely with double-sided non-slip rug tape.  Do not have throw rugs and other things on the floor that can make you trip. What can I do in the bedroom?  Use night lights.  Make sure that you have a light by your bed that is easy to reach.  Do not use any sheets or blankets that are too big for your bed. They should not hang down onto the floor.  Have a firm chair that has side arms. You can use this for support while you get dressed.  Do not have throw rugs and other things on the floor that can make you trip. What can I do in the kitchen?  Clean up any spills right away.  Avoid walking on wet floors.  Keep items that you use a lot in easy-to-reach places.  If you need to reach something above you, use a strong step stool that has a grab bar.  Keep electrical cords out of the way.  Do not use floor polish or wax that makes floors slippery. If you must  use wax, use non-skid floor wax.  Do not have throw rugs and other things on the floor that can make you trip. What can I do with my stairs?  Do not leave any items on the stairs.  Make sure that there are handrails on both sides of the stairs and use them. Fix handrails that are broken or loose. Make sure that handrails are as long as the stairways.  Check any carpeting to make sure that it is firmly attached to the stairs. Fix any carpet that is loose or worn.  Avoid having throw rugs at the top or bottom of the stairs. If you do have throw rugs, attach them to the floor with carpet tape.  Make sure that you have a light switch at the top of the stairs and the bottom of the stairs. If you do not have them, ask someone to add them for you. What else can I do to help prevent falls?  Wear shoes that:  Do not have high heels.  Have rubber bottoms.  Are comfortable and fit you  well.  Are closed at the toe. Do not wear sandals.  If you use a stepladder:  Make sure that it is fully opened. Do not climb a closed stepladder.  Make sure that both sides of the stepladder are locked into place.  Ask someone to hold it for you, if possible.  Clearly mark and make sure that you can see:  Any grab bars or handrails.  First and last steps.  Where the edge of each step is.  Use tools that help you move around (mobility aids) if they are needed. These include:  Canes.  Walkers.  Scooters.  Crutches.  Turn on the lights when you go into a dark area. Replace any light bulbs as soon as they burn out.  Set up your furniture so you have a clear path. Avoid moving your furniture around.  If any of your floors are uneven, fix them.  If there are any pets around you, be aware of where they are.  Review your medicines with your doctor. Some medicines can make you feel dizzy. This can increase your chance of falling. Ask your doctor what other things that you can do to help prevent falls. This information is not intended to replace advice given to you by your health care provider. Make sure you discuss any questions you have with your health care provider. Document Released: 08/03/2009 Document Revised: 03/14/2016 Document Reviewed: 11/11/2014 Elsevier Interactive Patient Education  2017 [employer].

## 2019-10-08 ENCOUNTER — Other Ambulatory Visit: Payer: Self-pay | Admitting: Family Medicine

## 2019-10-26 ENCOUNTER — Encounter: Payer: Self-pay | Admitting: "Endocrinology

## 2019-10-26 ENCOUNTER — Ambulatory Visit (INDEPENDENT_AMBULATORY_CARE_PROVIDER_SITE_OTHER): Payer: Medicare PPO | Admitting: "Endocrinology

## 2019-10-26 DIAGNOSIS — E039 Hypothyroidism, unspecified: Secondary | ICD-10-CM | POA: Diagnosis not present

## 2019-10-26 MED ORDER — LEVOTHYROXINE SODIUM 75 MCG PO TABS: ORAL_TABLET | ORAL | 1 refills | Status: DC

## 2019-10-26 NOTE — Progress Notes (Signed)
10/26/2019                                 Endocrinology Telehealth Visit Follow up Note -During COVID -19 Pandemic  I connected with Tammy Galvan on 10/26/2019   by telephone and verified that I am speaking with the correct person using two identifiers. Tammy Galvan, [birth year]. she has verbally consented to this visit. All issues noted in this document were discussed and addressed. The format was not optimal for physical exam.   Chief complaint: Follow-up for hypothyroidism   HPI  Tammy Galvan is a 86 y.o.-year-old female. -She is being engaged in telehealth via telephone in follow-up for  hypothyroidism and multinodular goiter.  She is currently on Synthroid 75 mcg p.o. nightly.  She reports compliance with medication.  She denies any new complaints today.  She has steady weight, no palpitations, tremors, heat/cold intolerance.   -She did not have a chance to go for her previsit labs-concerned about Covid.  -Her last thyroid ultrasound was consistent with slight shrinking of bilateral thyroid lobes as well as previously documented nodules.  Left thyroid lobe nodule fine-needle aspiration in November 2016 was negative for malignancy.  ROS: Limited as above.  PE: There were no vitals taken for this visit. Wt Readings from Last 3 Encounters:  08/06/19 215 lb (97.5 kg)  05/20/19 218 lb (98.9 kg)  02/18/19 216 lb (98 kg)     June 03, 2018 thyroid ultrasound IMPRESSION: 1. Normal-sized thyroid with stable solitary left nodule, previously biopsied. No indication for biopsy or dedicated imaging follow-up  August 23, 2015 fine-needle aspiration of left lobe nodule benign follicular nodule.   No results found for this or any previous visit (from the past 2160 hour(s)).  ASSESSMENT: 1. MNG 2. hypothyroidism  PLAN:  -She is advised to continue Synthroid 75 mcg p.o. every morning.   -She is willing to go up for thyroid function test next 5 days.  She will be contacted if  adjustment is needed.    - We discussed about the correct intake of her thyroid hormone, on empty stomach at fasting, with water, separated by at least 30 minutes from breakfast and other medications,  and separated by more than 4 hours from calcium, iron, multivitamins, acid reflux medications (PPIs). -Patient is made aware of the fact that thyroid hormone replacement is needed for life, dose to be adjusted by periodic monitoring of thyroid function tests.   -She is status post fine needle aspiration which was reported to be benign follicular nodule in November 2016 -[name].  Her thyroid ultrasound from June 03, 2018 is consistent with shrinking of bilateral thyroid lobes and previously biopsied nodules.  She will not need any further intervention or follow-up ultrasound.   I advised her to maintain close follow up with Dr. Tula Nakayama  for her primary care needs.     - Time spent on this patient care encounter:  15 minutes of which 50% was spent in  counseling and the rest reviewing  her current and  previous labs / studies and medications  doses and developing a plan for long term care. Tammy Galvan  participated in the discussions, expressed understanding, and voiced agreement with the above plans.  All questions were answered to her satisfaction. she is encouraged to contact clinic should she have any questions or concerns prior to her return visit.   Heriberto Antigua  Dorris Fetch, MD  Tel. [phone], Fax [phone]  -  This note was partially dictated with voice recognition software. Similar sounding words can be transcribed inadequately or may not  be corrected upon review.

## 2019-10-27 ENCOUNTER — Other Ambulatory Visit: Payer: Self-pay

## 2019-10-27 ENCOUNTER — Ambulatory Visit (INDEPENDENT_AMBULATORY_CARE_PROVIDER_SITE_OTHER): Payer: Medicare PPO | Admitting: Family Medicine

## 2019-10-27 ENCOUNTER — Encounter: Payer: Self-pay | Admitting: Family Medicine

## 2019-10-27 VITALS — BP 126/74 | Ht 68.0 in | Wt 215.0 lb

## 2019-10-27 DIAGNOSIS — R7301 Impaired fasting glucose: Secondary | ICD-10-CM | POA: Diagnosis not present

## 2019-10-27 DIAGNOSIS — E559 Vitamin D deficiency, unspecified: Secondary | ICD-10-CM

## 2019-10-27 DIAGNOSIS — Z6832 Body mass index (BMI) 32.0-32.9, adult: Secondary | ICD-10-CM | POA: Diagnosis not present

## 2019-10-27 DIAGNOSIS — E785 Hyperlipidemia, unspecified: Secondary | ICD-10-CM | POA: Diagnosis not present

## 2019-10-27 DIAGNOSIS — E038 Other specified hypothyroidism: Secondary | ICD-10-CM

## 2019-10-27 DIAGNOSIS — I10 Essential (primary) hypertension: Secondary | ICD-10-CM

## 2019-10-27 DIAGNOSIS — M170 Bilateral primary osteoarthritis of knee: Secondary | ICD-10-CM | POA: Diagnosis not present

## 2019-10-27 DIAGNOSIS — E6609 Other obesity due to excess calories: Secondary | ICD-10-CM

## 2019-10-27 DIAGNOSIS — Z1231 Encounter for screening mammogram for malignant neoplasm of breast: Secondary | ICD-10-CM | POA: Diagnosis not present

## 2019-10-27 LAB — T4, FREE: Free T4: 1.3 ng/dL (ref 0.8–1.8)

## 2019-10-27 LAB — TSH: TSH: 2.3 mIU/L (ref 0.40–4.50)

## 2019-10-27 NOTE — Progress Notes (Signed)
Virtual Visit via Telephone Note  I connected with Tammy Galvan on 10/27/19 at  9:40 AM EST by telephone and verified that I am speaking with the correct person using two identifiers.  Location: Patient: home Provider: office  I discussed the limitations, risks, security and privacy concerns of performing an evaluation and management service by telephone and the availability of in person appointments. I also discussed with the patient that there may be a patient responsible charge related to this service. The patient expressed understanding and agreed to proceed.   History of Present Illness: F/u chronic problems, medication review and update health maintenance Feels well, no concerns Denies recent fever or chills. Denies sinus pressure, nasal congestion, ear pain or sore throat. Denies chest congestion, productive cough or wheezing. Denies chest pains, palpitations and leg swelling Denies abdominal pain, nausea, vomiting,diarrhea or constipation.   Denies dysuria, frequency, hesitancy or incontinence. Denies uncontrolled joint pain, swelling and limitation in mobility.no falls Denies headaches, seizures, numbness, or tingling. Denies depression, anxiety or insomnia. Denies skin break down or rash.       Observations/Objective:  BP 126/74   Ht 5\' 8"  (1.727 m)   Wt 215 lb (97.5 kg)   BMI 32.69 kg/m  Good communication with no confusion and intact memory. Alert and oriented x 3 No signs of respiratory distress during speech   Assessment and Plan:  Essential hypertension Controlled, no change in medication DASH diet and commitment to daily physical activity for a minimum of 30 minutes discussed and encouraged, as a part of hypertension management. The importance of attaining a healthy weight is also discussed.  BP/Weight 10/27/2019 08/06/2019 05/20/2019 02/18/2019 12/24/2018 10/26/2018 Q000111Q  Systolic BP 123XX123 123XX123 123XX123 Q000111Q Q000111Q [id number]  Diastolic BP 74 74 74 75 75 72 78  Wt.  (Lbs) 215 215 218 216 215 216 218  BMI 32.69 32.69 34.14 33.83 33.67 33.83 34.14       Hyperlipidemia LDL goal <100 Hyperlipidemia:Low fat diet discussed and encouraged.   Lipid Panel  Lab Results  Component Value Date   CHOL 147 05/13/2019   HDL 48 (L) 05/13/2019   LDLCALC 84 05/13/2019   TRIG 73 05/13/2019   CHOLHDL 3.1 05/13/2019   Controlled, no change in medication Updated lab needed at/ before next visit.     Obesity  Patient re-educated about  the importance of commitment to a  minimum of 150 minutes of exercise per week as able.  The importance of healthy food choices with portion control discussed, as well as eating regularly and within a 12 hour window most days. The need to choose "clean , green" food 50 to 75% of the time is discussed, as well as to make water the primary drink and set a goal of 64 ounces water daily.    Weight /BMI 10/27/2019 08/06/2019 05/20/2019  WEIGHT 215 lb 215 lb 218 lb  HEIGHT 5\' 8"  5\' 8"  5\' 7"   BMI 32.69 kg/m2 32.69 kg/m2 34.14 kg/m2      Vitamin D deficiency Updated lab needed at/ before next visit.   Other specified hypothyroidism Controlled, no change in medication Managed by Endo  Osteoarthritis of both knees Denies any recent falls, denies uncontrolled pain and stiffness   Follow Up Instructions:    I discussed the assessment and treatment plan with the patient. The patient was provided an opportunity to ask questions and all were answered. The patient agreed with the plan and demonstrated an understanding of the instructions.   The  patient was advised to call back or seek an in-person evaluation if the symptoms worsen or if the condition fails to improve as anticipated.  I provided 21 minutes of non-face-to-face time during this encounter.   Tula Nakayama, MD

## 2019-10-27 NOTE — Patient Instructions (Signed)
Annual physical exam in office with MD last week in August or early September, call if you need me before  Please schedule mammogram due in August at checkout  Please get CBC, fasting lipid, cmp and eGFr , HBA1c and vit d first week in February  It is important that you exercise regularly at least 30 minutes 5 times a week. If you develop chest pain, have severe difficulty breathing, or feel very tired, stop exercising immediately and seek medical attention  Think about what you will eat, plan ahead. Choose " clean, green, fresh or frozen" over canned, processed or packaged foods which are more sugary, salty and fatty. 70 to 75% of food eaten should be vegetables and fruit. Three meals at set times with snacks allowed between meals, but they must be fruit or vegetables. Aim to eat over a 12 hour period , example 7 am to 7 pm, and STOP after  your last meal of the day. Drink water,generally about 64 ounces per day, no other drink is as healthy. Fruit juice is best enjoyed in a healthy way, by EATING the fruit.   Thanks for choosing [facility], we consider it a privelige to serve you. Best for 2021!

## 2019-10-28 MED ORDER — IBUPROFEN 600 MG PO TABS: 600.0000 mg | ORAL_TABLET | Freq: Three times a day (TID) | ORAL | 0 refills | Status: DC | PRN

## 2019-10-29 ENCOUNTER — Encounter: Payer: Self-pay | Admitting: Family Medicine

## 2019-10-29 NOTE — Assessment & Plan Note (Signed)
Controlled, no change in medication DASH diet and commitment to daily physical activity for a minimum of 30 minutes discussed and encouraged, as a part of hypertension management. The importance of attaining a healthy weight is also discussed.  BP/Weight 10/27/2019 08/06/2019 05/20/2019 02/18/2019 12/24/2018 10/26/2018 Q000111Q  Systolic BP 123XX123 123XX123 123XX123 Q000111Q Q000111Q [id number]  Diastolic BP 74 74 74 75 75 72 78  Wt. (Lbs) 215 215 218 216 215 216 218  BMI 32.69 32.69 34.14 33.83 33.67 33.83 34.14

## 2019-10-29 NOTE — Assessment & Plan Note (Signed)
Controlled, no change in medication  Managed by Endo 

## 2019-10-29 NOTE — Assessment & Plan Note (Signed)
Denies any recent falls, denies uncontrolled pain and stiffness

## 2019-10-29 NOTE — Assessment & Plan Note (Signed)
Updated lab needed at/ before next visit.   

## 2019-10-29 NOTE — Assessment & Plan Note (Signed)
  Patient re-educated about  the importance of commitment to a  minimum of 150 minutes of exercise per week as able.  The importance of healthy food choices with portion control discussed, as well as eating regularly and within a 12 hour window most days. The need to choose "clean , green" food 50 to 75% of the time is discussed, as well as to make water the primary drink and set a goal of 64 ounces water daily.    Weight /BMI 10/27/2019 08/06/2019 05/20/2019  WEIGHT 215 lb 215 lb 218 lb  HEIGHT 5\' 8"  5\' 8"  5\' 7"   BMI 32.69 kg/m2 32.69 kg/m2 34.14 kg/m2

## 2019-10-29 NOTE — Assessment & Plan Note (Signed)
Hyperlipidemia:Low fat diet discussed and encouraged.   Lipid Panel  Lab Results  Component Value Date   CHOL 147 05/13/2019   HDL 48 (L) 05/13/2019   LDLCALC 84 05/13/2019   TRIG 73 05/13/2019   CHOLHDL 3.1 05/13/2019   Controlled, no change in medication Updated lab needed at/ before next visit.

## 2019-11-18 ENCOUNTER — Ambulatory Visit: Payer: Medicare Other | Admitting: Family Medicine

## 2019-12-17 DIAGNOSIS — H401131 Primary open-angle glaucoma, bilateral, mild stage: Secondary | ICD-10-CM | POA: Diagnosis not present

## 2020-03-16 DIAGNOSIS — H524 Presbyopia: Secondary | ICD-10-CM | POA: Diagnosis not present

## 2020-03-16 DIAGNOSIS — H5213 Myopia, bilateral: Secondary | ICD-10-CM | POA: Diagnosis not present

## 2020-03-16 DIAGNOSIS — H401131 Primary open-angle glaucoma, bilateral, mild stage: Secondary | ICD-10-CM | POA: Diagnosis not present

## 2020-03-16 DIAGNOSIS — Z961 Presence of intraocular lens: Secondary | ICD-10-CM | POA: Diagnosis not present

## 2020-04-12 ENCOUNTER — Other Ambulatory Visit: Payer: Self-pay | Admitting: "Endocrinology

## 2020-04-13 ENCOUNTER — Other Ambulatory Visit: Payer: Self-pay | Admitting: Family Medicine

## 2020-04-27 ENCOUNTER — Ambulatory Visit: Payer: Medicare PPO | Admitting: "Endocrinology

## 2020-05-01 ENCOUNTER — Telehealth: Payer: Self-pay | Admitting: "Endocrinology

## 2020-05-01 ENCOUNTER — Other Ambulatory Visit: Payer: Self-pay

## 2020-05-01 DIAGNOSIS — E039 Hypothyroidism, unspecified: Secondary | ICD-10-CM

## 2020-05-01 DIAGNOSIS — E042 Nontoxic multinodular goiter: Secondary | ICD-10-CM | POA: Diagnosis not present

## 2020-05-01 NOTE — Telephone Encounter (Signed)
Pt has an appt Thursday, can you update her labs

## 2020-05-01 NOTE — Telephone Encounter (Signed)
DONE

## 2020-05-02 LAB — T4, FREE: Free T4: 1.3 ng/dL (ref 0.8–1.8)

## 2020-05-02 LAB — TSH: TSH: 1.43 mIU/L (ref 0.40–4.50)

## 2020-05-04 ENCOUNTER — Other Ambulatory Visit: Payer: Self-pay

## 2020-05-04 ENCOUNTER — Encounter: Payer: Self-pay | Admitting: "Endocrinology

## 2020-05-04 ENCOUNTER — Ambulatory Visit (INDEPENDENT_AMBULATORY_CARE_PROVIDER_SITE_OTHER): Payer: Medicare PPO | Admitting: "Endocrinology

## 2020-05-04 VITALS — BP 135/72 | HR 60 | Ht 68.0 in | Wt 221.0 lb

## 2020-05-04 DIAGNOSIS — E042 Nontoxic multinodular goiter: Secondary | ICD-10-CM

## 2020-05-04 DIAGNOSIS — E039 Hypothyroidism, unspecified: Secondary | ICD-10-CM

## 2020-05-04 MED ORDER — LEVOTHYROXINE SODIUM 75 MCG PO TABS: ORAL_TABLET | ORAL | 3 refills | Status: DC

## 2020-05-04 NOTE — Progress Notes (Signed)
05/04/2020     Endocrinology follow-up note  Chief complaint: Follow-up for hypothyroidism   HPI  Tammy Galvan is a 86 y.o.-year-old female. -She is being engaged in telehealth via telephone in follow-up for  hypothyroidism and multinodular goiter.  She is currently on Synthroid 75 mcg p.o. daily before breakfast.  She reports compliance with medication.  She denies any new complaints today.  She has steady weight, no palpitations, tremors, heat/cold intolerance.    -Her last thyroid ultrasound was consistent with slight shrinking of bilateral thyroid lobes as well as previously documented nodules.  Left thyroid lobe nodule fine-needle aspiration in November 2016 was negative for malignancy.  ROS: Limited as above.  PE: BP 135/72   Pulse 60   Ht 5\' 8"  (1.727 m)   Wt 221 lb (100.2 kg)   BMI 33.60 kg/m  Wt Readings from Last 3 Encounters:  05/04/20 221 lb (100.2 kg)  10/27/19 215 lb (97.5 kg)  08/06/19 215 lb (97.5 kg)     June 03, 2018 thyroid ultrasound IMPRESSION: 1. Normal-sized thyroid with stable solitary left nodule, previously biopsied. No indication for biopsy or dedicated imaging follow-up  August 23, 2015 fine-needle aspiration of left lobe nodule benign follicular nodule.   Recent Results (from the past 2160 hour(s))  T4, Free     Status: None   Collection Time: 05/01/20  3:15 PM  Result Value Ref Range   Free T4 1.3 0.8 - 1.8 ng/dL  TSH     Status: None   Collection Time: 05/01/20  3:15 PM  Result Value Ref Range   TSH 1.43 0.40 - 4.50 mIU/L    ASSESSMENT: 1. MNG 2. hypothyroidism  PLAN:  -Her previsit thyroid function tests are consistent with appropriate replacement.  She is advised to continue  Synthroid 75 mcg p.o. every morning.     - We discussed about the correct intake of her thyroid hormone, on empty stomach at fasting, with water, separated by at least 30 minutes from breakfast and other medications,  and separated by more than 4  hours from calcium, iron, multivitamins, acid reflux medications (PPIs). -Patient is made aware of the fact that thyroid hormone replacement is needed for life, dose to be adjusted by periodic monitoring of thyroid function tests.   -She is status post fine needle aspiration which was reported to be benign follicular nodule in November 2016 -[name].  Her thyroid ultrasound from June 03, 2018 is consistent with shrinking of bilateral thyroid lobes and previously biopsied nodules.  She will have repeat surveillance thyroid ultrasound before her next visit in a year.     I advised her to maintain close follow up with Dr. Tula Nakayama  for her primary care needs.     - Time spent on this patient care encounter:  20 minutes of which 50% was spent in  counseling and the rest reviewing  her current and  previous labs / studies and medications  doses and developing a plan for long term care. [name]  participated in the discussions, expressed understanding, and voiced agreement with the above plans.  All questions were answered to her satisfaction. she is encouraged to contact clinic should she have any questions or concerns prior to her return visit.    Glade Lloyd, MD  Tel. [phone], Fax [phone]  -  This note was partially dictated with voice recognition software. Similar sounding words can be transcribed inadequately or may not  be corrected upon review.

## 2020-06-12 ENCOUNTER — Ambulatory Visit (HOSPITAL_COMMUNITY): Admission: RE | Admit: 2020-06-12 | Payer: Medicare PPO | Source: Ambulatory Visit

## 2020-06-19 DIAGNOSIS — Z961 Presence of intraocular lens: Secondary | ICD-10-CM | POA: Diagnosis not present

## 2020-06-19 DIAGNOSIS — H401131 Primary open-angle glaucoma, bilateral, mild stage: Secondary | ICD-10-CM | POA: Diagnosis not present

## 2020-06-22 ENCOUNTER — Ambulatory Visit (HOSPITAL_COMMUNITY)
Admission: RE | Admit: 2020-06-22 | Discharge: 2020-06-22 | Disposition: A | Discharge status: Home / Self Care | Payer: Medicare PPO | Source: Ambulatory Visit | Attending: Family Medicine | Admitting: Family Medicine

## 2020-06-22 ENCOUNTER — Other Ambulatory Visit: Payer: Self-pay

## 2020-06-22 DIAGNOSIS — Z1231 Encounter for screening mammogram for malignant neoplasm of breast: Secondary | ICD-10-CM | POA: Insufficient documentation

## 2020-06-27 ENCOUNTER — Other Ambulatory Visit: Payer: Self-pay

## 2020-06-27 ENCOUNTER — Ambulatory Visit (INDEPENDENT_AMBULATORY_CARE_PROVIDER_SITE_OTHER): Payer: Medicare PPO | Admitting: Family Medicine

## 2020-06-27 ENCOUNTER — Encounter: Payer: Self-pay | Admitting: Family Medicine

## 2020-06-27 VITALS — BP 130/70 | HR 74 | Resp 16 | Ht 68.0 in | Wt 221.1 lb

## 2020-06-27 DIAGNOSIS — E559 Vitamin D deficiency, unspecified: Secondary | ICD-10-CM

## 2020-06-27 DIAGNOSIS — I10 Essential (primary) hypertension: Secondary | ICD-10-CM

## 2020-06-27 DIAGNOSIS — Z23 Encounter for immunization: Secondary | ICD-10-CM | POA: Diagnosis not present

## 2020-06-27 DIAGNOSIS — H04123 Dry eye syndrome of bilateral lacrimal glands: Secondary | ICD-10-CM

## 2020-06-27 DIAGNOSIS — H4010X0 Unspecified open-angle glaucoma, stage unspecified: Secondary | ICD-10-CM

## 2020-06-27 DIAGNOSIS — E785 Hyperlipidemia, unspecified: Secondary | ICD-10-CM

## 2020-06-27 DIAGNOSIS — Z0000 Encounter for general adult medical examination without abnormal findings: Secondary | ICD-10-CM | POA: Diagnosis not present

## 2020-06-27 MED ORDER — IBUPROFEN 800 MG PO TABS
800.0000 mg | ORAL_TABLET | Freq: Three times a day (TID) | ORAL | 0 refills | Status: DC | PRN
Start: 2020-06-27 — End: 2020-12-25

## 2020-06-27 NOTE — Assessment & Plan Note (Signed)

## 2020-06-27 NOTE — Patient Instructions (Addendum)
F/u in office with mD in 6 months, call if you need me sooner  Flu vaccine today  You are referrred to local ophthalmologist re glaucoma and dry eyes, per your request  Please get fasting lipid, cmp and eGFr, CBC, vit D next week Tuesday  Limited amount of ibuprofen, is prescribed for 6 months  Use tylenol 325 mg two tablets up to twice daily for arthritic pain   It is important that you exercise regularly at least 30 minutes 5 times a week. If you develop chest pain, have severe difficulty breathing, or feel very tired, stop exercising immediately and seek medical attention  Think about what you will eat, plan ahead. Choose " clean, green, fresh or frozen" over canned, processed or packaged foods which are more sugary, salty and fatty. 70 to 75% of food eaten should be vegetables and fruit. Three meals at set times with snacks allowed between meals, but they must be fruit or vegetables. Aim to eat over a 12 hour period , example 7 am to 7 pm, and STOP after  your last meal of the day. Drink water,generally about 64 ounces per day, no other drink is as healthy. Fruit juice is best enjoyed in a healthy way, by EATING the fruit. Thanks for choosing [facility], we consider it a privelige to serve you.

## 2020-06-27 NOTE — Progress Notes (Signed)
    Tammy Galvan     MRN: [id number]      DOB: June 21, 1933  HPI: Patient is in for annual physical exam. No other health concerns are expressed or addressed at the visit. Recent labs, if available are reviewed. Immunization is reviewed , and  updated if needed.   PE: BP 130/70   Pulse 74   Resp 16   Ht 5\' 8"  (1.727 m)   Wt 221 lb 1.9 oz (100.3 kg)   SpO2 97%   BMI 33.62 kg/m   Pleasant  female, alert and oriented x 3, in no cardio-pulmonary distress. Afebrile. HEENT No facial trauma or asymetry. Sinuses non tender.  Extra occullar muscles intact.. External ears normal, . Neck: supple, no adenopathy,JVD or thyromegaly.No bruits.  Chest: Clear to ascultation bilaterally.No crackles or wheezes. Non tender to palpation  Breast: No asymetry,no masses or lumps. No tenderness. No nipple discharge or inversion. No axillary or supraclavicular adenopathy  Cardiovascular system; Heart sounds normal,  S1 and  S2 ,no S3.  No murmur, or thrill. Apical beat not displaced Peripheral pulses normal.  Abdomen: Soft, non tender, no organomegaly or masses. No bruits. Bowel sounds normal. No guarding, tenderness or rebound.   GU: External genitalia normal female genitalia , normal female distribution of hair. No lesions. Urethral meatus normal in size, no  Prolapse, no lesions visibly  Present. Bladder non tender. Vagina pink and moist , with no visible lesions , discharge present . Adequate pelvic support no  cystocele or rectocele noted Cervix pink and appears healthy, no lesions or ulcerations noted, no discharge noted from os Uterus normal size, no adnexal masses, no cervical motion or adnexal tenderness.   Musculoskeletal exam: Adequate though reduced  ROM of spine, hips , shoulders and knees.  deformity ,swelling and  crepitus noted.in knees No muscle wasting or atrophy.   Neurologic: Cranial nerves 2 to 12 intact. Power, tone ,sensation and reflexes normal  throughout.  disturbance in gait. No tremor.  Skin: Intact, no ulceration, erythema , scaling or rash noted. Pigmentation normal throughout  Psych; Normal mood and affect. Judgement and concentration normal   Assessment & Plan:  Annual physical exam Annual exam as documented. Counseling done  re healthy lifestyle involving commitment to 150 minutes exercise per week, heart healthy diet, and attaining healthy weight.The importance of adequate sleep also discussed. Regular seat belt use and home safety, is also discussed. Changes in health habits are decided on by the patient with goals and time frames  set for achieving them. Immunization and cancer screening needs are specifically addressed at this visit.

## 2020-06-29 ENCOUNTER — Other Ambulatory Visit: Payer: Self-pay | Admitting: Family Medicine

## 2020-07-04 DIAGNOSIS — E559 Vitamin D deficiency, unspecified: Secondary | ICD-10-CM | POA: Diagnosis not present

## 2020-07-04 DIAGNOSIS — I10 Essential (primary) hypertension: Secondary | ICD-10-CM | POA: Diagnosis not present

## 2020-07-04 DIAGNOSIS — E785 Hyperlipidemia, unspecified: Secondary | ICD-10-CM | POA: Diagnosis not present

## 2020-07-05 LAB — LIPID PANEL
Chol/HDL Ratio: 3.1 ratio (ref 0.0–4.4)
Cholesterol, Total: 167 mg/dL (ref 100–199)
HDL: 54 mg/dL (ref >39)
LDL Chol Calc (NIH): 100 mg/dL — ABNORMAL HIGH (ref 0–99)
Triglycerides: 70 mg/dL (ref 0–149)
VLDL Cholesterol Cal: 13 mg/dL (ref 5–40)

## 2020-07-05 LAB — CMP14+EGFR
ALT: 16 IU/L (ref 0–32)
AST: 22 IU/L (ref 0–40)
Albumin/Globulin Ratio: 1.7 (ref 1.2–2.2)
Albumin: 4.5 g/dL (ref 3.6–4.6)
Alkaline Phosphatase: 77 IU/L (ref 44–121)
BUN/Creatinine Ratio: 16 (ref 12–28)
BUN: 14 mg/dL (ref 8–27)
Bilirubin Total: 0.5 mg/dL (ref 0.0–1.2)
CO2: 23 mmol/L (ref 20–29)
Calcium: 9.8 mg/dL (ref 8.7–10.3)
Chloride: 104 mmol/L (ref 96–106)
Creatinine, Ser: 0.86 mg/dL (ref 0.57–1.00)
GFR calc Af Amer: 72 mL/min/{1.73_m2} (ref >59)
GFR calc non Af Amer: 62 mL/min/{1.73_m2} (ref >59)
Globulin, Total: 2.6 g/dL (ref 1.5–4.5)
Glucose: 91 mg/dL (ref 65–99)
Potassium: 4.3 mmol/L (ref 3.5–5.2)
Sodium: 141 mmol/L (ref 134–144)
Total Protein: 7.1 g/dL (ref 6.0–8.5)

## 2020-07-05 LAB — CBC
Hematocrit: 46.3 % (ref 34.0–46.6)
Hemoglobin: 14.7 g/dL (ref 11.1–15.9)
MCH: 26.5 pg — ABNORMAL LOW (ref 26.6–33.0)
MCHC: 31.7 g/dL (ref 31.5–35.7)
MCV: 84 fL (ref 79–97)
Platelets: 303 10*3/uL (ref 150–450)
RBC: 5.54 x10E6/uL — ABNORMAL HIGH (ref 3.77–5.28)
RDW: 13.9 % (ref 11.7–15.4)
WBC: 7 10*3/uL (ref 3.4–10.8)

## 2020-07-05 LAB — VITAMIN D 25 HYDROXY (VIT D DEFICIENCY, FRACTURES): Vit D, 25-Hydroxy: 31.6 ng/mL (ref 30.0–100.0)

## 2020-07-12 ENCOUNTER — Telehealth: Payer: Self-pay

## 2020-07-12 NOTE — Telephone Encounter (Signed)
Pt called and requested a call back. Called back no answer

## 2020-07-15 ENCOUNTER — Other Ambulatory Visit: Payer: Self-pay | Admitting: Family Medicine

## 2020-07-17 NOTE — Telephone Encounter (Signed)
Aware of lab results  

## 2020-08-09 ENCOUNTER — Other Ambulatory Visit: Payer: Self-pay

## 2020-08-09 ENCOUNTER — Ambulatory Visit (INDEPENDENT_AMBULATORY_CARE_PROVIDER_SITE_OTHER): Payer: Medicare PPO

## 2020-08-09 VITALS — BP 143/76 | HR 74 | Resp 16 | Ht 68.0 in | Wt 221.0 lb

## 2020-08-09 DIAGNOSIS — Z0000 Encounter for general adult medical examination without abnormal findings: Secondary | ICD-10-CM

## 2020-08-09 NOTE — Progress Notes (Addendum)
Subjective:   Tammy Galvan is a 86 y.o. female who presents for Medicare Annual (Subsequent) preventive examination.      Objective:    There were no vitals filed for this visit. There is no height or weight on file to calculate BMI.  Advanced Directives 08/06/2019 02/17/2017 07/01/2012  Does Patient Have a Medical Advance Directive? No No Patient does not have advance directive;Patient would not like information  Would patient like information on creating a medical advance directive? No - Patient declined Yes (MAU/Ambulatory/Procedural Areas - Information given) -    Current Medications (verified) Outpatient Encounter Medications as of 08/09/2020  Medication Sig  . amLODipine (NORVASC) 10 MG tablet TAKE 1 TABLET BY MOUTH EVERY DAY  . aspirin 325 MG tablet Take 325 mg by mouth daily.    . brimonidine-timolol (COMBIGAN) 0.2-0.5 % ophthalmic solution Place 1 drop into both eyes 2 (two) times daily.  . hydrochlorothiazide (HYDRODIURIL) 25 MG tablet TAKE 1 TABLET BY MOUTH EVERY DAY  . ibuprofen (ADVIL) 800 MG tablet Take 1 tablet (800 mg total) by mouth every 8 (eight) hours as needed.  Marland Kitchen KLOR-CON M20 20 MEQ tablet TAKE 1 TABLET BY MOUTH EVERY DAY  . levothyroxine (SYNTHROID) 75 MCG tablet Use as directed  . metoprolol succinate (TOPROL-XL) 25 MG 24 hr tablet TAKE 1 TABLET BY MOUTH EVERY DAY  . pravastatin (PRAVACHOL) 10 MG tablet TAKE 1 TABLET BY MOUTH EVERY DAY   Facility-Administered Encounter Medications as of 08/09/2020  Medication  . methylPREDNISolone acetate (DEPO-MEDROL) injection 80 mg    Allergies (verified) Ace inhibitors, Hydrochlorothiazide, and Iodine   History: Past Medical History:  Diagnosis Date  . Glaucoma 2010  . Hyperlipidemia   . Hypertension   . Obesity   . Prediabetes 2013  . Thyroid disease   . TIA (transient ischemic attack)    Past Surgical History:  Procedure Laterality Date  . ABDOMINAL HYSTERECTOMY  1986  . CATARACT EXTRACTION    .  COLONOSCOPY  07/01/2012   Procedure: COLONOSCOPY;  Surgeon: Rogene Houston, MD;  Location: [facility];  Service: Endoscopy;  Laterality: N/A;  830  . EYE SURGERY      bilateral cataract exrtaction  . FRACTURE SURGERY     right ankle and left ankle    Family History  Problem Relation Age of Onset  . Arthritis Mother   . Diabetes Father   . Heart disease Father   . Stroke Sister   . Diabetes Sister   . Pulmonary embolism Sister   . Kidney disease Brother        on dialysis  . Kidney failure Brother    Social History   Socioeconomic History  . Marital status: Widowed    Spouse name: Not on file  . Number of children: Not on file  . Years of education: Not on file  . Highest education level: Not on file  Occupational History  . Not on file  Tobacco Use  . Smoking status: Never Smoker  . Smokeless tobacco: Never Used  Vaping Use  . Vaping Use: Never used  Substance and Sexual Activity  . Alcohol use: No  . Drug use: No  . Sexual activity: Never    Birth control/protection: Surgical  Other Topics Concern  . Not on file  Social History Narrative  . Not on file   Social Determinants of Health   Financial Resource Strain:   . Difficulty of Paying Living Expenses: Not on file  Food  Insecurity:   . Worried About Charity fundraiser in the Last Year: Not on file  . Ran Out of Food in the Last Year: Not on file  Transportation Needs:   . Lack of Transportation (Medical): Not on file  . Lack of Transportation (Non-Medical): Not on file  Physical Activity:   . Days of Exercise per Week: Not on file  . Minutes of Exercise per Session: Not on file  Stress:   . Feeling of Stress : Not on file  Social Connections:   . Frequency of Communication with Friends and Family: Not on file  . Frequency of Social Gatherings with Friends and Family: Not on file  . Attends Religious Services: Not on file  . Active Member of Clubs or Organizations: Not on file  . Attends Theatre manager Meetings: Not on file  . Marital Status: Not on file    Tobacco Counseling Counseling given: Not Answered                  Diabetic? No          Activities of Daily Living No flowsheet data found.  Patient Care Team: Fayrene Helper, MD as PCP - General  Indicate any recent Medical Services you may have received from other than [facility] providers in the past year (date may be approximate).     Assessment:   This is a routine wellness examination for Catheline.  Hearing/Vision screen No exam data present  Dietary issues and exercise activities discussed:    Goals    . Have 3 meals a day     Recommend eating 3 balanced meals a day and cutting back on sweets.      Depression Screen PHQ 2/9 Scores 06/27/2020 08/06/2019 05/20/2019 10/26/2018 07/27/2018 01/30/2018 12/23/2017  PHQ - 2 Score 0 0 0 1 0 0 0  PHQ- 9 Score - - - 4 - - -    Fall Risk Fall Risk  06/27/2020 10/27/2019 08/06/2019 05/20/2019 02/18/2019  Falls in the past year? 0 0 0 0 0  Number falls in past yr: - 0 0 0 0  Injury with Fall? - 0 0 0 0  Risk for fall due to : - - - - -  Follow up - - Falls evaluation completed;Education provided - -    Any stairs in or around the home? Yes  If so, are there any without handrails? Yes  Home free of loose throw rugs in walkways, pet beds, electrical cords, etc? Yes  Adequate lighting in your home to reduce risk of falls? Yes   ASSISTIVE DEVICES UTILIZED TO PREVENT FALLS:  Life alert? Yes  Use of a cane, walker or w/c? No  Grab bars in the bathroom? No  Shower chair or bench in shower? Yes  Elevated toilet seat or a handicapped toilet? No   TIMED UP AND GO:  Was the test performed? No .       6CIT Screen 08/06/2019 07/27/2018 02/17/2017  What Year? 0 points 0 points 0 points  What month? 0 points 0 points 0 points  What time? 0 points 0 points 0 points  Count back from 20 0 points 0 points 0 points  Months in reverse 0 points 0 points 0 points    Repeat phrase 0 points 0 points 0 points  Total Score 0 0 0    Immunizations Immunization History  Administered Date(s) Administered  . Fluad Quad(high Dose 65+) 07/21/2019, 06/27/2020  .  Influenza Split 08/30/2011, 07/08/2012  . Influenza Whole 08/05/2007, 06/26/2010  . Influenza, High Dose Seasonal PF 07/27/2018  . Influenza,inj,Quad PF,6+ Mos 07/15/2013, 06/14/2014, 10/19/2015, 08/08/2016, 07/29/2017  . Moderna SARS-COVID-2 Vaccination 12/16/2019, 01/14/2020  . Pneumococcal Conjugate-13 06/15/2015  . Pneumococcal Polysaccharide-23 04/03/2009  . Td 06/18/2007   Tdap: Up to date Flu Vaccine status: Up to date Pneumococcal vaccine status: Up to date Covid-19 vaccine status: Completed vaccines  Qualifies for Shingles Vaccine? Yes   Zostavax completed No   Shingrix Completed?: No.    Education has been provided regarding the importance of this vaccine. Patient has been advised to call insurance company to determine out of pocket expense if they have not yet received this vaccine. Advised may also receive vaccine at local pharmacy or Health Dept. Verbalized acceptance and understanding.  Screening Tests Health Maintenance  Topic Date Due  . TETANUS/TDAP  10/26/2020 (Originally 06/17/2017)  . INFLUENZA VACCINE  Completed  . DEXA SCAN  Completed  . COVID-19 Vaccine  Completed  . PNA vac Low Risk Adult  Completed    Health Maintenance  There are no preventive care reminders to display for this patient.  Colorectal cancer screening: No longer required.  Mammogram status: Completed this year. Repeat every year Bone Density status: Completed -normal . Results reflect: Bone density results: NORMAL. Repeat every annually years.  Lung Cancer Screening: (Low Dose CT Chest recommended if Age 27-80 years, 30 pack-year currently smoking OR have quit w/in 15years.) does not qualify.    Additional Screening:   Hepatitis C Screening: does not qualify; Completed   Vision Screening:  Recommended annual ophthalmology exams for early detection of glaucoma and other disorders of the eye. Is the patient up to date with their annual eye exam?  Yes    Who is the provider or what is the name of the office in which the patient attends annual eye exams? Dr. Gershon Crane    If pt is not established with a provider, would they like to be referred to a provider to establish care? No .   Dental Screening: Recommended annual dental exams for proper oral hygiene  Community Resource Referral / Chronic Care Management: CRR required this visit?  No   CCM required this visit?  No      Plan:     I have personally reviewed and noted the following in the patient's chart:   . Medical and social history . Use of alcohol, tobacco or illicit drugs  . Current medications and supplements . Functional ability and status . Nutritional status . Physical activity . Advanced directives . List of other physicians . Hospitalizations, surgeries, and ER visits in previous 12 months . Vitals . Screenings to include cognitive, depression, and falls . Referrals and appointments  In addition, I have reviewed and discussed with patient certain preventive protocols, quality metrics, and best practice recommendations. A written personalized care plan for preventive services as well as general preventive health recommendations were provided to patient.     Lonn Georgia, LPN   [id number]   Nurse Notes: AWV conducted over the phone with pt in the home and provider in the office.

## 2020-10-26 DIAGNOSIS — H401131 Primary open-angle glaucoma, bilateral, mild stage: Secondary | ICD-10-CM | POA: Diagnosis not present

## 2020-10-27 ENCOUNTER — Encounter: Payer: Self-pay | Admitting: Nurse Practitioner

## 2020-10-27 ENCOUNTER — Ambulatory Visit (INDEPENDENT_AMBULATORY_CARE_PROVIDER_SITE_OTHER): Payer: Medicare PPO | Admitting: Nurse Practitioner

## 2020-10-27 ENCOUNTER — Other Ambulatory Visit: Payer: Self-pay

## 2020-10-27 DIAGNOSIS — M79672 Pain in left foot: Secondary | ICD-10-CM

## 2020-10-27 DIAGNOSIS — M79671 Pain in right foot: Secondary | ICD-10-CM

## 2020-10-27 DIAGNOSIS — B351 Tinea unguium: Secondary | ICD-10-CM

## 2020-10-27 MED ORDER — EFINACONAZOLE 10 % EX SOLN: 1.0000 "application " | Freq: Every day | CUTANEOUS | 10 refills | Status: DC

## 2020-10-27 NOTE — Assessment & Plan Note (Signed)
-  to bilateral great toes

## 2020-10-27 NOTE — Assessment & Plan Note (Signed)
-  has pain to bilateral great toes -her onychomycosis has elevated her toenails -the toebox of her shoes is too small to accommodate the size of her nails, so she has callus and blisters at the end of her toes -she will need better fitting shoes -refer to podiatry to eval

## 2020-10-27 NOTE — Patient Instructions (Addendum)
You were seen today for bilateral foot pain.  This is related to the large size of your toenails and the small size of the toe box of your shoes.  I prescribed a solution to apply to your toenail daily for 48 weeks. I also sent in a referral to Dr. Caprice Beaver, a podiatrist, to see if prescription shoes would help.  You can consider adding a cushion, like Dr. Felicie Morn to redistribute some of that pressure on your toes.  If you have any issues with the referral to Dr. Caprice Beaver, please notify our office.

## 2020-10-27 NOTE — Progress Notes (Signed)
Acute Office Visit  Subjective:    Patient ID: Tammy Galvan, female    DOB: 28-Dec-1932, 87 y.o.   MRN: [id number]  Chief Complaint  Patient presents with  . Toe Pain    X 1 year   . Cough    HPI Patient is in today for painful great toes.  This has been going on for about a year.  She rates her pain at 8/10 that she describes as an ache.  She states it is worse when she is walking and feels like her toes are rubbing against the end of her shoes.  She states that her daughter insisted that she come to be checked out.  Past Medical History:  Diagnosis Date  . Glaucoma 2010  . Hyperlipidemia   . Hypertension   . Obesity   . Prediabetes 2013  . Thyroid disease   . TIA (transient ischemic attack)     Past Surgical History:  Procedure Laterality Date  . ABDOMINAL HYSTERECTOMY  1986  . CATARACT EXTRACTION    . COLONOSCOPY  07/01/2012   Procedure: COLONOSCOPY;  Surgeon: Rogene Houston, MD;  [facility];  Service: Endoscopy;  Laterality: N/A;  830  . EYE SURGERY      bilateral cataract exrtaction  . FRACTURE SURGERY     right ankle and left ankle     Family History  Problem Relation Age of Onset  . Arthritis Mother   . Diabetes Father   . Heart disease Father   . Stroke Sister   . Diabetes Sister   . Pulmonary embolism Sister   . Kidney disease Brother        on dialysis  . Kidney failure Brother     Social History   Socioeconomic History  . Marital status: Widowed    Spouse name: Not on file  . Number of children: Not on file  . Years of education: Not on file  . Highest education level: Not on file  Occupational History  . Occupation: Retired   Tobacco Use  . Smoking status: Never Smoker  . Smokeless tobacco: Never Used  Vaping Use  . Vaping Use: Never used  Substance and Sexual Activity  . Alcohol use: No  . Drug use: No  . Sexual activity: Never    Birth control/protection: Surgical  Other Topics Concern  . Not on file  Social  History Narrative  . Not on file   Social Determinants of Health   Financial Resource Strain: Low Risk   . Difficulty of Paying Living Expenses: Not very hard  Food Insecurity: No Food Insecurity  . Worried About Charity fundraiser in the Last Year: Never true  . Ran Out of Food in the Last Year: Never true  Transportation Needs: No Transportation Needs  . Lack of Transportation (Medical): No  . Lack of Transportation (Non-Medical): No  Physical Activity: Sufficiently Active  . Days of Exercise per Week: 5 days  . Minutes of Exercise per Session: 30 min  Stress: No Stress Concern Present  . Feeling of Stress : Not at all  Social Connections: Moderately Integrated  . Frequency of Communication with Friends and Family: More than three times a week  . Frequency of Social Gatherings with Friends and Family: Three times a week  . Attends Religious Services: 1 to 4 times per year  . Active Member of Clubs or Organizations: Yes  . Attends Archivist Meetings: 1 to 4 times per  year  . Marital Status: Widowed  Intimate Partner Violence: Not At Risk  . Fear of Current or Ex-Partner: No  . Emotionally Abused: No  . Physically Abused: No  . Sexually Abused: No    Outpatient Medications Prior to Visit  Medication Sig Dispense Refill  . amLODipine (NORVASC) 10 MG tablet TAKE 1 TABLET BY MOUTH EVERY DAY 90 tablet 3  . aspirin 325 MG tablet Take 325 mg by mouth daily.    . brimonidine-timolol (COMBIGAN) 0.2-0.5 % ophthalmic solution Place 1 drop into both eyes 2 (two) times daily.    . hydrochlorothiazide (HYDRODIURIL) 25 MG tablet TAKE 1 TABLET BY MOUTH EVERY DAY 90 tablet 1  . ibuprofen (ADVIL) 800 MG tablet Take 1 tablet (800 mg total) by mouth every 8 (eight) hours as needed. 30 tablet 0  . KLOR-CON M20 20 MEQ tablet TAKE 1 TABLET BY MOUTH EVERY DAY 90 tablet 3  . levothyroxine (SYNTHROID) 75 MCG tablet Use as directed 90 tablet 3  . metoprolol succinate (TOPROL-XL) 25 MG 24  hr tablet TAKE 1 TABLET BY MOUTH EVERY DAY 90 tablet 1  . pravastatin (PRAVACHOL) 10 MG tablet TAKE 1 TABLET BY MOUTH EVERY DAY 90 tablet 3   Facility-Administered Medications Prior to Visit  Medication Dose Route Frequency Provider Last Rate Last Admin  . methylPREDNISolone acetate (DEPO-MEDROL) injection 80 mg  80 mg Intra-articular Once Raylene Everts, MD        Allergies  Allergen Reactions  . Ace Inhibitors Other (See Comments)    Cough   . Hydrochlorothiazide Cough  . Iodine Itching    Review of Systems  Constitutional: Negative.   Respiratory: Negative.   Cardiovascular: Negative.   Skin:       -pain to bilateral great toes at end of the toes       Objective:    Physical Exam Constitutional:      Appearance: Normal appearance.  Skin:    General: Skin is warm and dry.     Comments: -bilateral onychomycosis to great toes -she has calloses to distal end of both great toes  Neurological:     Mental Status: She is alert.     BP 134/62   Pulse 60   Temp 97.6 F (36.4 C)   Resp 20   Ht 5\' 7"  (1.702 m)   Wt 221 lb (100.2 kg)   SpO2 94%   BMI 34.61 kg/m  Wt Readings from Last 3 Encounters:  10/27/20 221 lb (100.2 kg)  08/09/20 221 lb (100.2 kg)  06/27/20 221 lb 1.9 oz (100.3 kg)    Health Maintenance Due  Topic Date Due  . TETANUS/TDAP  06/17/2017  . COVID-19 Vaccine (3 - Booster for Moderna series) 07/16/2020    There are no preventive care reminders to display for this patient.   Lab Results  Component Value Date   TSH 1.43 05/01/2020   Lab Results  Component Value Date   WBC 7.0 07/04/2020   HGB 14.7 07/04/2020   HCT 46.3 07/04/2020   MCV 84 07/04/2020   PLT 303 07/04/2020   Lab Results  Component Value Date   NA 141 07/04/2020   K 4.3 07/04/2020   CO2 23 07/04/2020   GLUCOSE 91 07/04/2020   BUN 14 07/04/2020   CREATININE 0.86 07/04/2020   BILITOT 0.5 07/04/2020   ALKPHOS 77 07/04/2020   AST 22 07/04/2020   ALT 16 07/04/2020    PROT 7.1 07/04/2020   ALBUMIN 4.5 07/04/2020  CALCIUM 9.8 07/04/2020   Lab Results  Component Value Date   CHOL 167 07/04/2020   Lab Results  Component Value Date   HDL 54 07/04/2020   Lab Results  Component Value Date   LDLCALC 100 (H) 07/04/2020   Lab Results  Component Value Date   TRIG 70 07/04/2020   Lab Results  Component Value Date   CHOLHDL 3.1 07/04/2020   Lab Results  Component Value Date   HGBA1C 5.7 (H) 05/13/2019       Assessment & Plan:   Problem List Items Addressed This Visit      Musculoskeletal and Integument   Onychomycosis    -to bilateral great toes         Other   Bilateral foot pain    -has pain to bilateral great toes -her onychomycosis has elevated her toenails -the toebox of her shoes is too small to accommodate the size of her nails, so she has callus and blisters at the end of her toes -she will need better fitting shoes -refer to podiatry to eval          No orders of the defined types were placed in this encounter.    Noreene Larsson, NP

## 2020-12-22 ENCOUNTER — Emergency Department (HOSPITAL_COMMUNITY)
Admission: EM | Admit: 2020-12-22 | Discharge: 2020-12-22 | Disposition: A | Discharge status: Home / Self Care | Payer: Medicare PPO | Attending: Emergency Medicine | Admitting: Emergency Medicine

## 2020-12-22 ENCOUNTER — Other Ambulatory Visit: Payer: Self-pay

## 2020-12-22 ENCOUNTER — Emergency Department (HOSPITAL_COMMUNITY): Payer: Medicare PPO

## 2020-12-22 ENCOUNTER — Encounter (HOSPITAL_COMMUNITY): Payer: Self-pay | Admitting: *Deleted

## 2020-12-22 DIAGNOSIS — Z8673 Personal history of transient ischemic attack (TIA), and cerebral infarction without residual deficits: Secondary | ICD-10-CM | POA: Diagnosis not present

## 2020-12-22 DIAGNOSIS — S99912A Unspecified injury of left ankle, initial encounter: Secondary | ICD-10-CM | POA: Diagnosis present

## 2020-12-22 DIAGNOSIS — Z79899 Other long term (current) drug therapy: Secondary | ICD-10-CM | POA: Diagnosis not present

## 2020-12-22 DIAGNOSIS — S93402A Sprain of unspecified ligament of left ankle, initial encounter: Secondary | ICD-10-CM | POA: Insufficient documentation

## 2020-12-22 DIAGNOSIS — E079 Disorder of thyroid, unspecified: Secondary | ICD-10-CM | POA: Insufficient documentation

## 2020-12-22 DIAGNOSIS — Z7982 Long term (current) use of aspirin: Secondary | ICD-10-CM | POA: Insufficient documentation

## 2020-12-22 DIAGNOSIS — I10 Essential (primary) hypertension: Secondary | ICD-10-CM | POA: Diagnosis not present

## 2020-12-22 DIAGNOSIS — W109XXA Fall (on) (from) unspecified stairs and steps, initial encounter: Secondary | ICD-10-CM | POA: Diagnosis not present

## 2020-12-22 DIAGNOSIS — Y9301 Activity, walking, marching and hiking: Secondary | ICD-10-CM | POA: Diagnosis not present

## 2020-12-22 DIAGNOSIS — M7989 Other specified soft tissue disorders: Secondary | ICD-10-CM | POA: Diagnosis not present

## 2020-12-22 NOTE — Discharge Instructions (Signed)
You may alternate taking Tylenol and Naproxen as needed for pain control. You may take Naproxen twice daily as directed on your discharge paperwork and you may take  [id number] mg of Tylenol every 6 hours. Do not exceed 4000 mg of Tylenol daily as this can lead to liver damage. Also, make sure to take Naproxen with meals as it can cause an upset stomach. Do not take other NSAIDs while taking Naproxen such as (Aleve, Ibuprofen, Aspirin, Celebrex, etc) and do not take more than the prescribed dose as this can lead to ulcers and bleeding in your GI tract. You may use warm and cold compresses to help with your symptoms.   Please follow up with your primary doctor within the next 7-10 days for re-evaluation and further treatment of your symptoms.   Please return to the ER sooner if you have any new or worsening symptoms.

## 2020-12-22 NOTE — ED Provider Notes (Signed)
[facility] EMERGENCY DEPARTMENT Provider Note   CSN: [id number] Arrival date & time: 12/22/20  1512     History Chief Complaint  Patient presents with  . Ankle Pain    Tammy Galvan is a 87 y.o. female.  HPI   87 year old female with a history of glaucoma, hyperlipidemia, retention, obesity, prediabetes, TIA, who presents to the emergency department today for evaluation of ankle pain.  Patient states she was walking up the stairs earlier this morning when she got to the top step she rolled her ankle and fell forward.  She denies any head trauma or LOC.  She denies pain elsewhere in her body. Pain is constant   Past Medical History:  Diagnosis Date  . Glaucoma 2010  . Hyperlipidemia   . Hypertension   . Obesity   . Prediabetes 2013  . Thyroid disease   . TIA (transient ischemic attack)     Patient Active Problem List   Diagnosis Date Noted  . Bilateral foot pain 10/27/2020  . Allergic cough 10/31/2018  . Onychomycosis 08/08/2016  . Vitamin D deficiency 10/19/2015  . Other specified hypothyroidism 08/29/2015  . Nontoxic multinodular goiter 08/15/2015  . Abnormal thyroid stimulating hormone (TSH) level 06/15/2015  . Annual physical exam 06/27/2014  . Osteoarthritis of both knees 11/07/2013  . Back pain with radiation 11/04/2013  . Hyperlipidemia LDL goal <100 08/31/2009  . Obesity 10/01/2008  . GLAUCOMA 01/27/2008  . Essential hypertension 01/27/2008  . DEGENERATIVE JOINT DISEASE, SPINE 01/27/2008    Past Surgical History:  Procedure Laterality Date  . ABDOMINAL HYSTERECTOMY  1986  . CATARACT EXTRACTION    . COLONOSCOPY  07/01/2012   Procedure: COLONOSCOPY;  Surgeon: Rogene Houston, MD;  Location: [facility];  Service: Endoscopy;  Laterality: N/A;  830  . EYE SURGERY      bilateral cataract exrtaction  . FRACTURE SURGERY     right ankle and left ankle      OB History   No obstetric history on file.     Family History  Problem Relation Age of Onset   . Arthritis Mother   . Diabetes Father   . Heart disease Father   . Stroke Sister   . Diabetes Sister   . Pulmonary embolism Sister   . Kidney disease Brother        on dialysis  . Kidney failure Brother     Social History   Tobacco Use  . Smoking status: Never Smoker  . Smokeless tobacco: Never Used  Vaping Use  . Vaping Use: Never used  Substance Use Topics  . Alcohol use: No  . Drug use: No    Home Medications Prior to Admission medications   Medication Sig Start Date End Date Taking? Authorizing Provider  amLODipine (NORVASC) 10 MG tablet TAKE 1 TABLET BY MOUTH EVERY DAY 06/29/20   Fayrene Helper, MD  aspirin 325 MG tablet Take 325 mg by mouth daily.    [provider]  brimonidine-timolol (COMBIGAN) 0.2-0.5 % ophthalmic solution Place 1 drop into both eyes 2 (two) times daily.    [provider]  Efinaconazole 10 % SOLN Apply 1 application topically daily. Use daily for 48 weeks. 10/27/20 09/28/21  Noreene Larsson, NP  hydrochlorothiazide (HYDRODIURIL) 25 MG tablet TAKE 1 TABLET BY MOUTH EVERY DAY 07/17/20   Fayrene Helper, MD  ibuprofen (ADVIL) 800 MG tablet Take 1 tablet (800 mg total) by mouth every 8 (eight) hours as needed. 06/27/20  Fayrene Helper, MD  KLOR-CON M20 20 MEQ tablet TAKE 1 TABLET BY MOUTH EVERY DAY 04/13/20   Fayrene Helper, MD  levothyroxine (SYNTHROID) 75 MCG tablet Use as directed 05/04/20   Cassandria Anger, MD  metoprolol succinate (TOPROL-XL) 25 MG 24 hr tablet TAKE 1 TABLET BY MOUTH EVERY DAY 07/17/20   Fayrene Helper, MD  pravastatin (PRAVACHOL) 10 MG tablet TAKE 1 TABLET BY MOUTH EVERY DAY 04/13/20   Fayrene Helper, MD    Allergies    Ace inhibitors, Hydrochlorothiazide, and Iodine  Review of Systems   Review of Systems  Musculoskeletal:       Left ankle pain  Skin: Negative for wound.  Neurological: Negative for weakness and numbness.       No head trauma or loc    Physical Exam Updated  Vital Signs BP (!) 165/71 (BP Location: Right Arm)   Pulse (!) 57   Temp (!) 97.5 F (36.4 C) (Oral)   Resp 18   SpO2 98%   Physical Exam Vitals and nursing note reviewed.  Constitutional:      General: She is not in acute distress.    Appearance: She is well-developed and well-nourished.  HENT:     Head: Normocephalic and atraumatic.  Eyes:     Conjunctiva/sclera: Conjunctivae normal.  Cardiovascular:     Rate and Rhythm: Normal rate.  Pulmonary:     Effort: Pulmonary effort is normal.  Musculoskeletal:        General: Normal range of motion.     Cervical back: Neck supple.     Comments: TTP to the left medial/lateral malleolus with swelling noted bilaterally. No ttp throughout the left foot.   Skin:    General: Skin is warm and dry.  Neurological:     General: No focal deficit present.     Mental Status: She is alert and oriented to person, place, and time.     Comments: Moving all extremities, clear speech  Psychiatric:        Mood and Affect: Mood and affect normal.     ED Results / Procedures / Treatments   Labs (all labs ordered are listed, but only abnormal results are displayed) Labs Reviewed - No data to display  EKG None  Radiology DG Ankle Complete Left  Result Date: 12/22/2020 CLINICAL DATA:  Rolled the ankle EXAM: LEFT ANKLE COMPLETE - 3+ VIEW COMPARISON:  01/23/2017 FINDINGS: Prominent soft tissue swelling. No acute displaced fracture or malalignment. Chronic ossicle or remote injury at the tip of the fibular malleolus. Ankle mortise is symmetric IMPRESSION: Soft tissue swelling. No definite acute osseous abnormality. Electronically Signed   By: Donavan Foil M.D.   On: 12/22/2020 16:33    Procedures Procedures   Medications Ordered in ED Medications - No data to display  ED Course  I have reviewed the triage vital signs and the nursing notes.  Pertinent labs & imaging results that were available during my care of the patient were reviewed by me  and considered in my medical decision making (see chart for details).     Durable Medical Equipment  (From admission, onward)         Start     Ordered   12/22/20 0000  For home use only DME Walker rolling       Question Answer Comment  Walker: With 5 Inch Wheels   Patient needs a walker to treat with the following condition Ankle sprain  12/22/20 1706             MDM Rules/Calculators/A&P                          87 y/o F presenting for eval of left ankle pain that started after she rolled it while walking up the stairs. Xray reviewed/interpreted - Soft tissue swelling. No definite acute osseous abnormality. Pt placed in an aso splint. She was ambulated in the ED with a walker.  I advised her to use a walker at home and weight-bear as tolerated.  DME was ordered within the department and patient given walker to go home with.  I will give her follow-up with orthopedics.  Advised her to return to the ED for any new or worsening symptoms meantime.  She voiced understanding of plan and reasons to return.  All Questions answered.  Patient stable for discharge.  Final Clinical Impression(s) / ED Diagnoses Final diagnoses:  Sprain of left ankle, unspecified ligament, initial encounter    Rx / DC Orders ED Discharge Orders         Ordered    Walker standard  Status:  Canceled        12/22/20 1704    For home use only DME Walker rolling        12/22/20 [address] [place] S, PA-C 12/22/20 1723    Milton Ferguson, MD 12/22/20 2206

## 2020-12-22 NOTE — Clinical Social Work Note (Signed)
Transition of Care [facility]) - Emergency Department Mini Assessment   Patient Details  Name: Tammy Galvan MRN: [id number] Date of Birth: 1933/05/04  Transition of Care [facility]) CM/SW Contact:    Iona Beard, [place] Phone Number: 12/22/2020, 5:29 PM   Clinical Narrative: CSW received call from ED provider that pt was in need of returning home with a walker. Pt provided with walker from [employer]. CSW had pt sign ticket for walker. TOC signing off.    ED Mini Assessment: What brought you to the Emergency Department? : Left ankle pain  Barriers to Discharge: Barriers Resolved  Barrier interventions: DME provided  Means of departure: Car  Interventions which prevented an admission or readmission: DME Provided    Patient Contact and Communications        ,          Patient states their goals for this hospitalization and ongoing recovery are:: Return home CMS Medicare.gov Compare Post Acute Care list provided to:: Patient Choice offered to / list presented to : Patient  Admission diagnosis:  Fall, ankle pain  Patient Active Problem List   Diagnosis Date Noted  . Bilateral foot pain 10/27/2020  . Allergic cough 10/31/2018  . Onychomycosis 08/08/2016  . Vitamin D deficiency 10/19/2015  . Other specified hypothyroidism 08/29/2015  . Nontoxic multinodular goiter 08/15/2015  . Abnormal thyroid stimulating hormone (TSH) level 06/15/2015  . Annual physical exam 06/27/2014  . Osteoarthritis of both knees 11/07/2013  . Back pain with radiation 11/04/2013  . Hyperlipidemia LDL goal <100 08/31/2009  . Obesity 10/01/2008  . GLAUCOMA 01/27/2008  . Essential hypertension 01/27/2008  . DEGENERATIVE JOINT DISEASE, SPINE 01/27/2008   PCP:  Fayrene Helper, MD Pharmacy:   [id number] - [place], [address] [place] [address] Phone: [phone] Fax: [phone]

## 2020-12-22 NOTE — ED Notes (Signed)
ED Provider at bedside. 

## 2020-12-22 NOTE — ED Triage Notes (Signed)
Left ankle pain, thinks she may have sprained her ankle

## 2020-12-25 ENCOUNTER — Encounter: Payer: Self-pay | Admitting: Family Medicine

## 2020-12-25 ENCOUNTER — Other Ambulatory Visit (HOSPITAL_COMMUNITY): Payer: Self-pay | Admitting: Family Medicine

## 2020-12-25 ENCOUNTER — Telehealth (INDEPENDENT_AMBULATORY_CARE_PROVIDER_SITE_OTHER): Payer: Medicare PPO | Admitting: Family Medicine

## 2020-12-25 ENCOUNTER — Other Ambulatory Visit: Payer: Self-pay

## 2020-12-25 VITALS — BP 165/71 | Resp 15 | Ht 67.0 in | Wt 221.0 lb

## 2020-12-25 DIAGNOSIS — E6609 Other obesity due to excess calories: Secondary | ICD-10-CM

## 2020-12-25 DIAGNOSIS — E038 Other specified hypothyroidism: Secondary | ICD-10-CM

## 2020-12-25 DIAGNOSIS — E785 Hyperlipidemia, unspecified: Secondary | ICD-10-CM

## 2020-12-25 DIAGNOSIS — Z1231 Encounter for screening mammogram for malignant neoplasm of breast: Secondary | ICD-10-CM

## 2020-12-25 DIAGNOSIS — S93402D Sprain of unspecified ligament of left ankle, subsequent encounter: Secondary | ICD-10-CM

## 2020-12-25 DIAGNOSIS — S93402A Sprain of unspecified ligament of left ankle, initial encounter: Secondary | ICD-10-CM | POA: Insufficient documentation

## 2020-12-25 DIAGNOSIS — I10 Essential (primary) hypertension: Secondary | ICD-10-CM

## 2020-12-25 DIAGNOSIS — Z6834 Body mass index (BMI) 34.0-34.9, adult: Secondary | ICD-10-CM

## 2020-12-25 DIAGNOSIS — B351 Tinea unguium: Secondary | ICD-10-CM | POA: Diagnosis not present
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
## 2020-12-25 NOTE — Assessment & Plan Note (Addendum)
Acute injury to left ankle climbing stairs on her porch at home, actually fell forward on her knee, had pain and swelling of affected ankle and le, seen in ED asseed as sprain and needs Ortho follow up
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
## 2020-12-26 ENCOUNTER — Encounter: Payer: Self-pay | Admitting: Orthopaedic Surgery

## 2020-12-28 ENCOUNTER — Encounter: Payer: Self-pay | Admitting: Orthopaedic Surgery

## 2020-12-28 ENCOUNTER — Ambulatory Visit: Payer: Medicare PPO | Admitting: Orthopaedic Surgery

## 2020-12-28 ENCOUNTER — Other Ambulatory Visit: Payer: Self-pay

## 2020-12-28 VITALS — BP 163/67 | HR 62 | Ht 67.0 in | Wt 219.2 lb

## 2020-12-28 DIAGNOSIS — S96912A Strain of unspecified muscle and tendon at ankle and foot level, left foot, initial encounter: Secondary | ICD-10-CM | POA: Diagnosis not present

## 2020-12-28 NOTE — Progress Notes (Signed)
Subjective:    Patient ID: Tammy Galvan, female    DOB: [birth year], 87 y.o.   MRN: [id number]  HPI She twisted and hurt her left ankle on 12-22-20.  She was seen by Dr. Griffin Dakin office and referred here. X-rays were done and showed soft tissue swelling laterally.  She has been given an ankle brace but thinks it is too restrictive and uses another brace. She is a little better. She is walking better.  She uses ice, rest and Tylenol.  She has no other joint pain.   Review of Systems  Constitutional: Positive for activity change.  Musculoskeletal: Positive for arthralgias, gait problem and joint swelling.  All other systems reviewed and are negative.  For Review of Systems, all other systems reviewed and are negative.  The following is a summary of the past history medically, past history surgically, known current medicines, social history and family history.  This information is gathered electronically by the computer from prior information and documentation.  I review this each visit and have found including this information at this point in the chart is beneficial and informative.   Past Medical History:  Diagnosis Date  . Glaucoma 2010  . Hyperlipidemia   . Hypertension   . Obesity   . Prediabetes 2013  . Thyroid disease   . TIA (transient ischemic attack)     Past Surgical History:  Procedure Laterality Date  . ABDOMINAL HYSTERECTOMY  1986  . CATARACT EXTRACTION    . COLONOSCOPY  07/01/2012   Procedure: COLONOSCOPY;  Surgeon: Rogene Houston, MD;  Location: [facility];  Service: Endoscopy;  Laterality: N/A;  830  . EYE SURGERY      bilateral cataract exrtaction  . FRACTURE SURGERY     right ankle and left ankle     Current Outpatient Medications on File Prior to Visit  Medication Sig Dispense Refill  . amLODipine (NORVASC) 10 MG tablet TAKE 1 TABLET BY MOUTH EVERY DAY 90 tablet 3  . aspirin 325 MG tablet Take 325 mg by mouth daily.    . brimonidine-timolol  (COMBIGAN) 0.2-0.5 % ophthalmic solution Place 1 drop into both eyes 2 (two) times daily.    . hydrochlorothiazide (HYDRODIURIL) 25 MG tablet TAKE 1 TABLET BY MOUTH EVERY DAY 90 tablet 1  . KLOR-CON M20 20 MEQ tablet TAKE 1 TABLET BY MOUTH EVERY DAY 90 tablet 3  . levothyroxine (SYNTHROID) 75 MCG tablet Use as directed 90 tablet 3  . metoprolol succinate (TOPROL-XL) 25 MG 24 hr tablet TAKE 1 TABLET BY MOUTH EVERY DAY 90 tablet 1  . pravastatin (PRAVACHOL) 10 MG tablet TAKE 1 TABLET BY MOUTH EVERY DAY 90 tablet 3   Current Facility-Administered Medications on File Prior to Visit  Medication Dose Route Frequency Provider Last Rate Last Admin  . methylPREDNISolone acetate (DEPO-MEDROL) injection 80 mg  80 mg Intra-articular Once Raylene Everts, MD        Social History   Socioeconomic History  . Marital status: Widowed    Spouse name: Not on file  . Number of children: Not on file  . Years of education: Not on file  . Highest education level: Not on file  Occupational History  . Occupation: Retired   Tobacco Use  . Smoking status: Never Smoker  . Smokeless tobacco: Never Used  Vaping Use  . Vaping Use: Never used  Substance and Sexual Activity  . Alcohol use: No  . Drug use: No  . Sexual activity: Never  Birth control/protection: Surgical  Other Topics Concern  . Not on file  Social History Narrative  . Not on file   Social Determinants of Health   Financial Resource Strain: Low Risk   . Difficulty of Paying Living Expenses: Not very hard  Food Insecurity: No Food Insecurity  . Worried About Charity fundraiser in the Last Year: Never true  . Ran Out of Food in the Last Year: Never true  Transportation Needs: No Transportation Needs  . Lack of Transportation (Medical): No  . Lack of Transportation (Non-Medical): No  Physical Activity: Sufficiently Active  . Days of Exercise per Week: 5 days  . Minutes of Exercise per Session: 30 min  Stress: No Stress Concern  Present  . Feeling of Stress : Not at all  Social Connections: Moderately Integrated  . Frequency of Communication with Friends and Family: More than three times a week  . Frequency of Social Gatherings with Friends and Family: Three times a week  . Attends Religious Services: 1 to 4 times per year  . Active Member of Clubs or Organizations: Yes  . Attends Archivist Meetings: 1 to 4 times per year  . Marital Status: Widowed  Intimate Partner Violence: Not At Risk  . Fear of Current or Ex-Partner: No  . Emotionally Abused: No  . Physically Abused: No  . Sexually Abused: No    Family History  Problem Relation Age of Onset  . Arthritis Mother   . Diabetes Father   . Heart disease Father   . Stroke Sister   . Diabetes Sister   . Pulmonary embolism Sister   . Kidney disease Brother        on dialysis  . Kidney failure Brother     BP (!) 163/67   Pulse 62   Ht 5\' 7"  (1.702 m)   Wt 219 lb 4 oz (99.5 kg)   BMI 34.34 kg/m   Body mass index is 34.34 kg/m.     Objective:   Physical Exam Vitals and nursing note reviewed. Exam conducted with a chaperone present.  Constitutional:      Appearance: She is well-developed.  HENT:     Head: Normocephalic and atraumatic.  Eyes:     Conjunctiva/sclera: Conjunctivae normal.     Pupils: Pupils are equal, round, and reactive to light.  Cardiovascular:     Rate and Rhythm: Normal rate and regular rhythm.  Pulmonary:     Effort: Pulmonary effort is normal.  Abdominal:     Palpations: Abdomen is soft.  Musculoskeletal:     Cervical back: Normal range of motion and neck supple.       Feet:  Skin:    General: Skin is warm and dry.  Neurological:     Mental Status: She is alert and oriented to person, place, and time.     Cranial Nerves: No cranial nerve deficit.     Motor: No abnormal muscle tone.     Coordination: Coordination normal.     Deep Tendon Reflexes: Reflexes are normal and symmetric. Reflexes normal.   Psychiatric:        Behavior: Behavior normal.        Thought Content: Thought content normal.        Judgment: Judgment normal.   I have independently reviewed and interpreted x-rays of this patient done at another site by another physician or qualified health professional.          Assessment & Plan:  Encounter Diagnosis  Name Primary?  . Strain of left ankle, initial encounter Yes   I have explained the findings to her.  I have given her instructions for Contrast Baths.  Use her ankle brace.  Return in two weeks.  Call if any problem.  Precautions discussed.   Electronically Signed Sanjuana Kava, MD 3/10/202211:23 AM

## 2021-01-05 NOTE — Assessment & Plan Note (Signed)
Has been referred to Podiatry in past 2 months, still awaiting appt, will f/u

## 2021-01-05 NOTE — Assessment & Plan Note (Signed)
Managed by Endo and controlled 

## 2021-01-05 NOTE — Assessment & Plan Note (Signed)
  Patient re-educated about  the importance of commitment to a  minimum of 150 minutes of exercise per week as able.  The importance of healthy food choices with portion control discussed, as well as eating regularly and within a 12 hour window most days. The need to choose "clean , green" food 50 to 75% of the time is discussed, as well as to make water the primary drink and set a goal of 64 ounces water daily.    Weight /BMI 12/28/2020 12/25/2020 10/27/2020  WEIGHT 219 lb 4 oz 221 lb 221 lb  HEIGHT 5\' 7"  5\' 7"  5\' 7"   BMI 34.34 kg/m2 34.61 kg/m2 34.61 kg/m2

## 2021-01-10 DIAGNOSIS — I10 Essential (primary) hypertension: Secondary | ICD-10-CM | POA: Diagnosis not present

## 2021-01-10 DIAGNOSIS — E785 Hyperlipidemia, unspecified: Secondary | ICD-10-CM | POA: Diagnosis not present

## 2021-01-11 ENCOUNTER — Encounter: Payer: Self-pay | Admitting: Sports Medicine

## 2021-01-11 ENCOUNTER — Ambulatory Visit: Payer: Medicare PPO | Admitting: Sports Medicine

## 2021-01-11 ENCOUNTER — Encounter: Payer: Self-pay | Admitting: Orthopaedic Surgery

## 2021-01-11 ENCOUNTER — Other Ambulatory Visit: Payer: Self-pay

## 2021-01-11 ENCOUNTER — Ambulatory Visit: Payer: Medicare PPO | Admitting: Orthopaedic Surgery

## 2021-01-11 VITALS — BP 161/77 | HR 60 | Ht 67.0 in | Wt 219.0 lb

## 2021-01-11 DIAGNOSIS — M25472 Effusion, left ankle: Secondary | ICD-10-CM | POA: Diagnosis not present

## 2021-01-11 DIAGNOSIS — S93402A Sprain of unspecified ligament of left ankle, initial encounter: Secondary | ICD-10-CM

## 2021-01-11 DIAGNOSIS — S96912D Strain of unspecified muscle and tendon at ankle and foot level, left foot, subsequent encounter: Secondary | ICD-10-CM | POA: Diagnosis not present

## 2021-01-11 DIAGNOSIS — B351 Tinea unguium: Secondary | ICD-10-CM

## 2021-01-11 LAB — LIPID PANEL
Chol/HDL Ratio: 3.2 ratio (ref 0.0–4.4)
Cholesterol, Total: 170 mg/dL (ref 100–199)
HDL: 53 mg/dL (ref >39)
LDL Chol Calc (NIH): 102 mg/dL — ABNORMAL HIGH (ref 0–99)
Triglycerides: 79 mg/dL (ref 0–149)
VLDL Cholesterol Cal: 15 mg/dL (ref 5–40)

## 2021-01-11 LAB — CMP14+EGFR
ALT: 14 IU/L (ref 0–32)
AST: 13 IU/L (ref 0–40)
Albumin/Globulin Ratio: 1.3 (ref 1.2–2.2)
Albumin: 3.9 g/dL (ref 3.6–4.6)
Alkaline Phosphatase: 83 IU/L (ref 44–121)
BUN/Creatinine Ratio: 21 (ref 12–28)
BUN: 17 mg/dL (ref 8–27)
Bilirubin Total: 0.4 mg/dL (ref 0.0–1.2)
CO2: 26 mmol/L (ref 20–29)
Calcium: 9.4 mg/dL (ref 8.7–10.3)
Chloride: 101 mmol/L (ref 96–106)
Creatinine, Ser: 0.81 mg/dL (ref 0.57–1.00)
Globulin, Total: 2.9 g/dL (ref 1.5–4.5)
Glucose: 76 mg/dL (ref 65–99)
Potassium: 4.3 mmol/L (ref 3.5–5.2)
Sodium: 141 mmol/L (ref 134–144)
Total Protein: 6.8 g/dL (ref 6.0–8.5)
eGFR: 71 mL/min/{1.73_m2} (ref >59)
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
## 2021-01-11 NOTE — Progress Notes (Signed)
I am better  Her left ankle is not as swollen, not as tender.  She still has some swelling but she is much improved.  She has been using her ankle brace and doing the contrast baths. ROM is full but tender and has some lateral swelling.  NV intact.  Encounter Diagnosis  Name Primary?  . Strain of left ankle, subsequent encounter Yes   I think she will gradually improved.  I will see her as needed.  Gradually taper out of the brace.  Call if any problem.  Precautions discussed.   Electronically Signed Sanjuana Kava, MD 3/24/202210:27 AM

## 2021-01-18 ENCOUNTER — Other Ambulatory Visit: Payer: Self-pay

## 2021-01-18 ENCOUNTER — Ambulatory Visit: Payer: Medicare PPO | Admitting: Internal Medicine

## 2021-01-18 ENCOUNTER — Encounter: Payer: Self-pay | Admitting: Internal Medicine

## 2021-01-18 VITALS — BP 144/72 | HR 72 | Resp 18 | Ht 66.0 in | Wt 221.4 lb

## 2021-01-18 DIAGNOSIS — I10 Essential (primary) hypertension: Secondary | ICD-10-CM

## 2021-01-18 DIAGNOSIS — E038 Other specified hypothyroidism: Secondary | ICD-10-CM

## 2021-01-18 DIAGNOSIS — S93402D Sprain of unspecified ligament of left ankle, subsequent encounter: Secondary | ICD-10-CM

## 2021-01-18 NOTE — Progress Notes (Signed)
Established Patient Office Visit  Subjective:  Patient ID: Tammy Galvan, female    DOB: 03/16/1933  Age: 87 y.o. MRN: [id number]  CC:  Chief Complaint  Patient presents with  . Hypertension    3 week follow up bp evaluation     HPI Tammy Galvan presents for follow up of her HTN.  HTN: Her BP was slightly elevated, but can be considered stable for her age. She denies any headache, dizziness, chest pain, dyspnea or palpitations.  Ankle sprain: She has been recovering well. She takes Aspirin PRN for pain. She has a ankle brace in place.  Past Medical History:  Diagnosis Date  . Glaucoma 2010  . Hyperlipidemia   . Hypertension   . Obesity   . Prediabetes 2013  . Thyroid disease   . TIA (transient ischemic attack)     Past Surgical History:  Procedure Laterality Date  . ABDOMINAL HYSTERECTOMY  1986  . CATARACT EXTRACTION    . COLONOSCOPY  07/01/2012   Procedure: COLONOSCOPY;  Surgeon: Rogene Houston, MD;  Location: [facility];  Service: Endoscopy;  Laterality: N/A;  830  . EYE SURGERY      bilateral cataract exrtaction  . FRACTURE SURGERY     right ankle and left ankle     Family History  Problem Relation Age of Onset  . Arthritis Mother   . Diabetes Father   . Heart disease Father   . Stroke Sister   . Diabetes Sister   . Pulmonary embolism Sister   . Kidney disease Brother        on dialysis  . Kidney failure Brother     Social History   Socioeconomic History  . Marital status: Widowed    Spouse name: Not on file  . Number of children: Not on file  . Years of education: Not on file  . Highest education level: Not on file  Occupational History  . Occupation: Retired   Tobacco Use  . Smoking status: Never Smoker  . Smokeless tobacco: Never Used  Vaping Use  . Vaping Use: Never used  Substance and Sexual Activity  . Alcohol use: No  . Drug use: No  . Sexual activity: Never    Birth control/protection: Surgical  Other Topics Concern  . Not  on file  Social History Narrative  . Not on file   Social Determinants of Health   Financial Resource Strain: Low Risk   . Difficulty of Paying Living Expenses: Not very hard  Food Insecurity: No Food Insecurity  . Worried About Charity fundraiser in the Last Year: Never true  . Ran Out of Food in the Last Year: Never true  Transportation Needs: No Transportation Needs  . Lack of Transportation (Medical): No  . Lack of Transportation (Non-Medical): No  Physical Activity: Sufficiently Active  . Days of Exercise per Week: 5 days  . Minutes of Exercise per Session: 30 min  Stress: No Stress Concern Present  . Feeling of Stress : Not at all  Social Connections: Moderately Integrated  . Frequency of Communication with Friends and Family: More than three times a week  . Frequency of Social Gatherings with Friends and Family: Three times a week  . Attends Religious Services: 1 to 4 times per year  . Active Member of Clubs or Organizations: Yes  . Attends Archivist Meetings: 1 to 4 times per year  . Marital Status: Widowed  Intimate Partner Violence: Not At  Risk  . Fear of Current or Ex-Partner: No  . Emotionally Abused: No  . Physically Abused: No  . Sexually Abused: No    Outpatient Medications Prior to Visit  Medication Sig Dispense Refill  . amLODipine (NORVASC) 10 MG tablet TAKE 1 TABLET BY MOUTH EVERY DAY 90 tablet 3  . aspirin 325 MG tablet Take 325 mg by mouth daily.    . brimonidine-timolol (COMBIGAN) 0.2-0.5 % ophthalmic solution Place 1 drop into both eyes 2 (two) times daily.    . hydrochlorothiazide (HYDRODIURIL) 25 MG tablet TAKE 1 TABLET BY MOUTH EVERY DAY 90 tablet 1  . KLOR-CON M20 20 MEQ tablet TAKE 1 TABLET BY MOUTH EVERY DAY 90 tablet 3  . levothyroxine (SYNTHROID) 75 MCG tablet Use as directed 90 tablet 3  . metoprolol succinate (TOPROL-XL) 25 MG 24 hr tablet TAKE 1 TABLET BY MOUTH EVERY DAY 90 tablet 1  . pravastatin (PRAVACHOL) 10 MG tablet TAKE 1  TABLET BY MOUTH EVERY DAY 90 tablet 3  . methylPREDNISolone acetate (DEPO-MEDROL) injection 80 mg      No facility-administered medications prior to visit.    Allergies  Allergen Reactions  . Iodine Itching  . Ace Inhibitors Other (See Comments)    Cough   . Hydrochlorothiazide Cough    ROS Review of Systems  Constitutional: Negative for chills and fever.  HENT: Negative for congestion, sinus pressure, sinus pain and sore throat.   Eyes: Negative for pain and discharge.  Respiratory: Negative for cough and shortness of breath.   Cardiovascular: Negative for chest pain and palpitations.  Gastrointestinal: Negative for abdominal pain, constipation, diarrhea, nausea and vomiting.  Endocrine: Negative for polydipsia and polyuria.  Genitourinary: Negative for dysuria and hematuria.  Musculoskeletal: Positive for arthralgias (L ankle). Negative for neck pain and neck stiffness.  Skin: Negative for rash.  Neurological: Negative for dizziness and weakness.  Psychiatric/Behavioral: Negative for agitation and behavioral problems.      Objective:    Physical Exam Vitals reviewed.  Constitutional:      General: She is not in acute distress.    Appearance: She is not diaphoretic.  HENT:     Head: Normocephalic and atraumatic.     Nose: Nose normal. No congestion.     Mouth/Throat:     Mouth: Mucous membranes are moist.     Pharynx: No posterior oropharyngeal erythema.  Eyes:     General: No scleral icterus.    Extraocular Movements: Extraocular movements intact.     Pupils: Pupils are equal, round, and reactive to light.  Cardiovascular:     Rate and Rhythm: Normal rate and regular rhythm.     Pulses: Normal pulses.     Heart sounds: Normal heart sounds. No murmur heard.   Pulmonary:     Breath sounds: Normal breath sounds. No wheezing or rales.  Musculoskeletal:     Cervical back: Neck supple. No tenderness.     Right lower leg: No edema.     Left lower leg: No edema.      Comments: Left ankle brace in place  Skin:    General: Skin is warm.     Findings: No rash.  Neurological:     General: No focal deficit present.     Mental Status: She is alert and oriented to person, place, and time.  Psychiatric:        Mood and Affect: Mood normal.        Behavior: Behavior normal.     BP Marland Kitchen)  144/72 (BP Location: Left Arm, Patient Position: Sitting, Cuff Size: Normal)   Pulse 72   Resp 18   Ht 5\' 6"  (1.676 m)   Wt 221 lb 6.4 oz (100.4 kg)   SpO2 98%   BMI 35.73 kg/m  Wt Readings from Last 3 Encounters:  01/18/21 221 lb 6.4 oz (100.4 kg)  01/11/21 219 lb (99.3 kg)  12/28/20 219 lb 4 oz (99.5 kg)     There are no preventive care reminders to display for this patient.  There are no preventive care reminders to display for this patient.  Lab Results  Component Value Date   TSH 1.43 05/01/2020   Lab Results  Component Value Date   WBC 7.0 07/04/2020   HGB 14.7 07/04/2020   HCT 46.3 07/04/2020   MCV 84 07/04/2020   PLT 303 07/04/2020   Lab Results  Component Value Date   NA 141 01/10/2021   K 4.3 01/10/2021   CO2 26 01/10/2021   GLUCOSE 76 01/10/2021   BUN 17 01/10/2021   CREATININE 0.81 01/10/2021   BILITOT 0.4 01/10/2021   ALKPHOS 83 01/10/2021   AST 13 01/10/2021   ALT 14 01/10/2021   PROT 6.8 01/10/2021   ALBUMIN 3.9 01/10/2021   CALCIUM 9.4 01/10/2021   Lab Results  Component Value Date   CHOL 170 01/10/2021   Lab Results  Component Value Date   HDL 53 01/10/2021   Lab Results  Component Value Date   LDLCALC 102 (H) 01/10/2021   Lab Results  Component Value Date   TRIG 79 01/10/2021   Lab Results  Component Value Date   CHOLHDL 3.2 01/10/2021   Lab Results  Component Value Date   HGBA1C 5.7 (H) 05/13/2019      Assessment & Plan:   Problem List Items Addressed This Visit      Cardiovascular and Mediastinum   Essential hypertension - Primary    BP Readings from Last 1 Encounters:  01/18/21 (!) 144/72    Overall stable for her age 64 for compliance with the medications Advised DASH diet and moderate exercise/walking as tolerated        Endocrine   Other specified hypothyroidism    Lab Results  Component Value Date   TSH 1.43 05/01/2020   Follows up with Endocrinologist        Musculoskeletal and Integument   Left ankle sprain ROM better now Tylenol PRN      No orders of the defined types were placed in this encounter.   Follow-up: Return in about 4 months (around 05/20/2021).    Lindell Spar, MD

## 2021-01-18 NOTE — Assessment & Plan Note (Signed)
Lab Results  Component Value Date   TSH 1.43 05/01/2020   Follows up with Endocrinologist

## 2021-01-18 NOTE — Patient Instructions (Signed)
Please continue taking medications as prescribed.  Please continue to follow low salt diet and ambulate as tolerated. 

## 2021-01-18 NOTE — Assessment & Plan Note (Signed)
BP Readings from Last 1 Encounters:  01/18/21 (!) 144/72   Overall stable for her age 87 for compliance with the medications Advised DASH diet and moderate exercise/walking as tolerated

## 2021-02-15 ENCOUNTER — Other Ambulatory Visit: Payer: Self-pay

## 2021-02-15 ENCOUNTER — Encounter: Payer: Self-pay | Admitting: Sports Medicine

## 2021-02-15 ENCOUNTER — Ambulatory Visit: Payer: Medicare PPO | Admitting: Sports Medicine

## 2021-02-15 DIAGNOSIS — M25472 Effusion, left ankle: Secondary | ICD-10-CM | POA: Diagnosis not present

## 2021-02-15 DIAGNOSIS — B351 Tinea unguium: Secondary | ICD-10-CM | POA: Diagnosis not present

## 2021-02-15 DIAGNOSIS — S93402D Sprain of unspecified ligament of left ankle, subsequent encounter: Secondary | ICD-10-CM

## 2021-02-15 DIAGNOSIS — M25572 Pain in left ankle and joints of left foot: Secondary | ICD-10-CM

## 2021-02-15 DIAGNOSIS — S96912D Strain of unspecified muscle and tendon at ankle and foot level, left foot, subsequent encounter: Secondary | ICD-10-CM | POA: Diagnosis not present

## 2021-02-15 DIAGNOSIS — L603 Nail dystrophy: Secondary | ICD-10-CM | POA: Diagnosis not present
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
## 2021-03-03 ENCOUNTER — Other Ambulatory Visit: Payer: Self-pay

## 2021-03-03 ENCOUNTER — Ambulatory Visit
Admission: RE | Admit: 2021-03-03 | Discharge: 2021-03-03 | Disposition: A | Discharge status: Home / Self Care | Payer: Medicare PPO | Source: Ambulatory Visit | Attending: Sports Medicine | Admitting: Sports Medicine

## 2021-03-03 DIAGNOSIS — M25572 Pain in left ankle and joints of left foot: Secondary | ICD-10-CM | POA: Diagnosis not present

## 2021-03-03 DIAGNOSIS — S93402D Sprain of unspecified ligament of left ankle, subsequent encounter: Secondary | ICD-10-CM

## 2021-03-03 DIAGNOSIS — R600 Localized edema: Secondary | ICD-10-CM | POA: Diagnosis not present

## 2021-03-03 DIAGNOSIS — G8929 Other chronic pain: Secondary | ICD-10-CM | POA: Diagnosis not present

## 2021-03-03 DIAGNOSIS — S96912D Strain of unspecified muscle and tendon at ankle and foot level, left foot, subsequent encounter: Secondary | ICD-10-CM

## 2021-03-05 DIAGNOSIS — H5213 Myopia, bilateral: Secondary | ICD-10-CM | POA: Diagnosis not present

## 2021-03-05 DIAGNOSIS — H524 Presbyopia: Secondary | ICD-10-CM | POA: Diagnosis not present

## 2021-03-05 DIAGNOSIS — Z961 Presence of intraocular lens: Secondary | ICD-10-CM | POA: Diagnosis not present

## 2021-03-05 DIAGNOSIS — H401131 Primary open-angle glaucoma, bilateral, mild stage: Secondary | ICD-10-CM | POA: Diagnosis not present

## 2021-03-09 ENCOUNTER — Other Ambulatory Visit: Payer: Self-pay | Admitting: Family Medicine

## 2021-03-15 ENCOUNTER — Ambulatory Visit: Payer: Medicare PPO | Admitting: Sports Medicine

## 2021-03-15 ENCOUNTER — Other Ambulatory Visit: Payer: Self-pay

## 2021-03-15 DIAGNOSIS — M25472 Effusion, left ankle: Secondary | ICD-10-CM | POA: Diagnosis not present

## 2021-03-15 DIAGNOSIS — M84375S Stress fracture, left foot, sequela: Secondary | ICD-10-CM

## 2021-03-15 DIAGNOSIS — M25572 Pain in left ankle and joints of left foot: Secondary | ICD-10-CM

## 2021-03-15 DIAGNOSIS — S82832S Other fracture of upper and lower end of left fibula, sequela: Secondary | ICD-10-CM

## 2021-03-15 DIAGNOSIS — B351 Tinea unguium: Secondary | ICD-10-CM | POA: Diagnosis not present

## 2021-03-15 DIAGNOSIS — M19072 Primary osteoarthritis, left ankle and foot: Secondary | ICD-10-CM

## 2021-03-16 ENCOUNTER — Encounter: Payer: Self-pay | Admitting: Sports Medicine
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
## 2021-04-19 ENCOUNTER — Other Ambulatory Visit: Payer: Self-pay | Admitting: Family Medicine

## 2021-04-27 ENCOUNTER — Other Ambulatory Visit: Payer: Self-pay

## 2021-04-27 ENCOUNTER — Ambulatory Visit (INDEPENDENT_AMBULATORY_CARE_PROVIDER_SITE_OTHER): Payer: Medicare PPO

## 2021-04-27 DIAGNOSIS — B351 Tinea unguium: Secondary | ICD-10-CM

## 2021-04-27 NOTE — Patient Instructions (Signed)

## 2021-04-27 NOTE — Progress Notes (Signed)
Patient presents today for the 1st laser treatment. Diagnosed with mycotic nail infection by Dr. Cannon Kettle.   Toenail most affected 1-5 Bilateral nails.  All other systems are negative.  Nails were filed thin. Laser therapy was administered to 1-5  bilateral toenails  and patient tolerated the treatment well. All safety precautions were in place.    Follow up in 4 weeks for laser # 2.  Picture of nails taken today to document visual progress

## 2021-05-07 ENCOUNTER — Ambulatory Visit: Payer: Medicare PPO | Admitting: "Endocrinology

## 2021-05-21 ENCOUNTER — Telehealth: Payer: Self-pay | Admitting: "Endocrinology

## 2021-05-21 ENCOUNTER — Other Ambulatory Visit: Payer: Self-pay

## 2021-05-21 DIAGNOSIS — E039 Hypothyroidism, unspecified: Secondary | ICD-10-CM

## 2021-05-21 NOTE — Telephone Encounter (Signed)
Pt scheduled for thyroid ultrasound Monday August 8th at 12:30. Pt rescheduled for office visit with [place] on the 16th of August at 10:30. Pt made aware and voiced understanding.

## 2021-05-21 NOTE — Progress Notes (Signed)
FR

## 2021-05-21 NOTE — Telephone Encounter (Signed)
Pt is sch for an appt next Monday as a year follow up, I do not see she has been sch for an ultrasound, can you follow up with this.

## 2021-05-24 ENCOUNTER — Ambulatory Visit: Payer: Medicare PPO | Admitting: Family Medicine

## 2021-05-25 ENCOUNTER — Other Ambulatory Visit: Payer: Self-pay

## 2021-05-25 ENCOUNTER — Ambulatory Visit (INDEPENDENT_AMBULATORY_CARE_PROVIDER_SITE_OTHER): Payer: Medicare PPO | Admitting: *Deleted

## 2021-05-25 DIAGNOSIS — B351 Tinea unguium: Secondary | ICD-10-CM

## 2021-05-25 NOTE — Progress Notes (Signed)
Patient presents today for the 2nd laser treatment. Diagnosed with mycotic nail infection by Dr. Cannon Kettle.   Toenail most affected are all ten nails. No change in the nails as of yet.  All other systems are negative.  Nails were filed thin. Laser therapy was administered to 1-5  bilateral toenails  and patient tolerated the treatment well. All safety precautions were in place.    Follow up in 4 weeks for laser # 3.   Picture of nails taken today to document visual progress

## 2021-05-28 ENCOUNTER — Other Ambulatory Visit: Payer: Self-pay

## 2021-05-28 ENCOUNTER — Ambulatory Visit (HOSPITAL_COMMUNITY)
Admission: RE | Admit: 2021-05-28 | Discharge: 2021-05-28 | Disposition: A | Discharge status: Home / Self Care | Payer: Medicare PPO | Source: Ambulatory Visit | Attending: "Endocrinology | Admitting: "Endocrinology

## 2021-05-28 ENCOUNTER — Ambulatory Visit: Payer: Medicare PPO | Admitting: "Endocrinology

## 2021-05-28 DIAGNOSIS — E041 Nontoxic single thyroid nodule: Secondary | ICD-10-CM | POA: Diagnosis not present

## 2021-05-28 DIAGNOSIS — E042 Nontoxic multinodular goiter: Secondary | ICD-10-CM | POA: Insufficient documentation

## 2021-05-28 DIAGNOSIS — E039 Hypothyroidism, unspecified: Secondary | ICD-10-CM | POA: Diagnosis not present

## 2021-05-29 LAB — T4, FREE: Free T4: 1.52 ng/dL (ref 0.82–1.77)

## 2021-05-29 LAB — TSH: TSH: 1.71 u[IU]/mL (ref 0.450–4.500)

## 2021-06-05 ENCOUNTER — Other Ambulatory Visit: Payer: Self-pay

## 2021-06-05 ENCOUNTER — Encounter: Payer: Self-pay | Admitting: "Endocrinology

## 2021-06-05 ENCOUNTER — Ambulatory Visit: Payer: Medicare PPO | Admitting: "Endocrinology

## 2021-06-05 VITALS — BP 126/64 | HR 68 | Ht 66.0 in | Wt 221.2 lb

## 2021-06-05 DIAGNOSIS — E039 Hypothyroidism, unspecified: Secondary | ICD-10-CM | POA: Diagnosis not present

## 2021-06-05 DIAGNOSIS — E042 Nontoxic multinodular goiter: Secondary | ICD-10-CM

## 2021-06-05 NOTE — Progress Notes (Signed)
06/05/2021     Endocrinology follow-up note  Chief complaint: Follow-up for hypothyroidism   HPI  Tammy Galvan is a 87 y.o.-year-old female. -She is being seen in follow-up for management of hypothyroidism, and multinodular goiter.     She is currently on Synthroid 75 mcg p.o. daily before breakfast.  She reports compliance and consistency taking her medication.    She denies any new complaints today.  She has steady weight, no palpitations, tremors, heat/cold intolerance.    -Her last thyroid ultrasound was consistent with slight shrinking of bilateral thyroid lobes as well as previously documented nodules.  Left thyroid lobe nodule fine-needle aspiration in November 2016 was negative for malignancy.  ROS: Limited as above.  PE: BP 126/64   Pulse 68   Ht '5\' 6"'$  (1.676 m)   Wt 221 lb 3.2 oz (100.3 kg)   BMI 35.70 kg/m  Wt Readings from Last 3 Encounters:  06/05/21 221 lb 3.2 oz (100.3 kg)  01/18/21 221 lb 6.4 oz (100.4 kg)  01/11/21 219 lb (99.3 kg)     June 03, 2018 thyroid ultrasound IMPRESSION: 1. Normal-sized thyroid with stable solitary left nodule, previously biopsied. No indication for biopsy or dedicated imaging follow-up  August 23, 2015 fine-needle aspiration of left lobe nodule benign follicular nodule.   Recent Results (from the past 2160 hour(s))  T4, Free     Status: None   Collection Time: 05/28/21  1:13 PM  Result Value Ref Range   Free T4 1.52 0.82 - 1.77 ng/dL  TSH     Status: None   Collection Time: 05/28/21  1:13 PM  Result Value Ref Range   TSH 1.710 0.450 - 4.500 uIU/mL    ASSESSMENT: 1. MNG 2. hypothyroidism  PLAN:  -Her previsit thyroid function tests are consistent with appropriate replacement.  She is advised to continue Synthroid  75 mcg p.o. every morning.      - We discussed about the correct intake of her thyroid hormone, on empty stomach at fasting, with water, separated by at least 30 minutes from breakfast and other  medications,  and separated by more than 4 hours from calcium, iron, multivitamins, acid reflux medications (PPIs). -Patient is made aware of the fact that thyroid hormone replacement is needed for life, dose to be adjusted by periodic monitoring of thyroid function tests.   -She is status post fine needle aspiration which was reported to be benign follicular nodule in November 2016 -[name].  Her thyroid ultrasound from June 03, 2018 is consistent with shrinking of bilateral thyroid lobes and previously biopsied nodules.  She will have repeat surveillance thyroid ultrasound before her next visit in a year.     I advised her to maintain close follow up with Dr. Tula Nakayama  for her primary care needs.   I spent 22 minutes in the care of the patient today including review of labs from Thyroid Function, CMP, and other relevant labs ; imaging/biopsy records (current and previous including abstractions from other facilities); face-to-face time discussing  her lab results and symptoms, medications doses, her options of short and long term treatment based on the latest standards of care / guidelines;   and documenting the encounter.  [name]  participated in the discussions, expressed understanding, and voiced agreement with the above plans.  All questions were answered to her satisfaction. she is encouraged to contact clinic should she have any questions or concerns prior to her return visit.  Glade Lloyd, MD  Tel. [phone], Fax [phone]  -  This note was partially dictated with voice recognition software. Similar sounding words can be transcribed inadequately or may not  be corrected upon review.

## 2021-06-13 ENCOUNTER — Ambulatory Visit (INDEPENDENT_AMBULATORY_CARE_PROVIDER_SITE_OTHER): Payer: Medicare PPO | Admitting: Family Medicine

## 2021-06-13 ENCOUNTER — Other Ambulatory Visit: Payer: Self-pay

## 2021-06-13 ENCOUNTER — Encounter: Payer: Self-pay | Admitting: Family Medicine

## 2021-06-13 VITALS — BP 135/66 | HR 61 | Resp 16 | Ht 66.0 in | Wt 219.8 lb

## 2021-06-13 DIAGNOSIS — E039 Hypothyroidism, unspecified: Secondary | ICD-10-CM

## 2021-06-13 DIAGNOSIS — M549 Dorsalgia, unspecified: Secondary | ICD-10-CM | POA: Diagnosis not present

## 2021-06-13 DIAGNOSIS — E559 Vitamin D deficiency, unspecified: Secondary | ICD-10-CM

## 2021-06-13 DIAGNOSIS — R7301 Impaired fasting glucose: Secondary | ICD-10-CM | POA: Diagnosis not present

## 2021-06-13 DIAGNOSIS — E785 Hyperlipidemia, unspecified: Secondary | ICD-10-CM

## 2021-06-13 DIAGNOSIS — I10 Essential (primary) hypertension: Secondary | ICD-10-CM | POA: Diagnosis not present

## 2021-06-13 NOTE — Patient Instructions (Addendum)
Annual exam as before call if you need me sooner  Labs today, lipid, cmp and eGFr, HBa1C, CBC and vit  Covid booster as soon as possible  Lets increase the vegetables!,  salads and water  It is important that you exercise regularly at least 30 minutes 5 times a week. If you develop chest pain, have severe difficulty breathing, or feel very tired, stop exercising immediately and seek medical attention   Thanks for choosing  Primary Care, we consider it a privelige to serve you.

## 2021-06-14 ENCOUNTER — Encounter: Payer: Self-pay | Admitting: Family Medicine

## 2021-06-14 NOTE — Assessment & Plan Note (Signed)
Followed by Ophthalmology and controlled per her report

## 2021-06-14 NOTE — Assessment & Plan Note (Signed)
  Patient re-educated about  the importance of commitment to a  minimum of 150 minutes of exercise per week as able.  The importance of healthy food choices with portion control discussed, as well as eating regularly and within a 12 hour window most days. The need to choose "clean , green" food 50 to 75% of the time is discussed, as well as to make water the primary drink and set a goal of 64 ounces water daily.    Weight /BMI 06/13/2021 06/05/2021 01/18/2021  WEIGHT 219 lb 12.8 oz 221 lb 3.2 oz 221 lb 6.4 oz  HEIGHT '5\' 6"'$  '5\' 6"'$  '5\' 6"'$   BMI 35.48 kg/m2 35.7 kg/m2 35.73 kg/m2

## 2021-06-14 NOTE — Assessment & Plan Note (Signed)
Controlled, no change in medication DASH diet and commitment to daily physical activity for a minimum of 30 minutes discussed and encouraged, as a part of hypertension management. The importance of attaining a healthy weight is also discussed.  BP/Weight 06/13/2021 06/05/2021 01/18/2021 01/11/2021 12/28/2020 [id number]  Systolic BP A999333 [id number]  Diastolic BP 66 64 72 77 67 71 71  Wt. (Lbs) 219.8 221.2 221.4 219 219.25 221 -  BMI 35.48 35.7 35.73 34.3 34.34 34.61 -

## 2021-06-14 NOTE — Progress Notes (Signed)
Tammy Galvan     MRN: [id number]      DOB: 12/07/1932   HPI Tammy Galvan is here for follow up and re-evaluation of chronic medical conditions, medication management and review of any available recent lab and radiology data.  Preventive health is updated, specifically  Cancer screening and Immunization.   Questions or concerns regarding consultations or procedures which the PT has had in the interim are  addressed. The PT denies any adverse reactions to current medications since the last visit.  There are no new concerns.  There are no specific complaints   ROS Denies recent fever or chills. Denies sinus pressure, nasal congestion, ear pain or sore throat. Denies chest congestion, productive cough or wheezing. Denies chest pains, palpitations and leg swelling Denies abdominal pain, nausea, vomiting,diarrhea or constipation.   Denies dysuria, frequency, hesitancy or incontinence. Denies  uncontrolled joint pain, swelling and limitation in mobility. Denies headaches, seizures, numbness, or tingling. Denies depression, anxiety or insomnia. Denies skin break down or rash.   PE  BP 135/66   Pulse 61   Resp 16   Ht '5\' 6"'$  (1.676 m)   Wt 219 lb 12.8 oz (99.7 kg)   SpO2 97%   BMI 35.48 kg/m   Patient alert and oriented and in no cardiopulmonary distress.  HEENT: No facial asymmetry, EOMI,     Neck supple .  Chest: Clear to auscultation bilaterally.  CVS: S1, S2 no murmurs, no S3.Regular rate.  ABD: Soft non tender.   Ext: No edema  MS: Adequate  though reduced ROM spine, shoulders, hips and knees.  Skin: Intact, no ulcerations or rash noted.  Psych: Good eye contact, normal affect. Memory intact not anxious or depressed appearing.  CNS: CN 2-12 intact, power,  normal throughout.no focal deficits noted.   Assessment & Plan  Hypothyroidism Controlled and managed by Endo, recent lab within normal.   Hyperlipidemia LDL goal <100 Hyperlipidemia:Low fat diet  discussed and encouraged.   Lipid Panel  Lab Results  Component Value Date   CHOL 180 06/13/2021   HDL 50 06/13/2021   LDLCALC WILL FOLLOW 06/13/2021   TRIG WILL FOLLOW 06/13/2021   CHOLHDL 3.6 06/13/2021   Awaiting LDL but appears controlled    Essential hypertension Controlled, no change in medication DASH diet and commitment to daily physical activity for a minimum of 30 minutes discussed and encouraged, as a part of hypertension management. The importance of attaining a healthy weight is also discussed.  BP/Weight 06/13/2021 06/05/2021 01/18/2021 01/11/2021 12/28/2020 [id number]  Systolic BP A999333 [id number] 123456 [id number]  Diastolic BP 66 64 72 77 67 71 71  Wt. (Lbs) 219.8 221.2 221.4 219 219.25 221 -  BMI 35.48 35.7 35.73 34.3 34.34 34.61 -       Back pain with radiation No current or recent flare of pain  Unspecified glaucoma Followed by Ophthalmology and controlled per her report  Morbid obesity ([place])  Patient re-educated about  the importance of commitment to a  minimum of 150 minutes of exercise per week as able.  The importance of healthy food choices with portion control discussed, as well as eating regularly and within a 12 hour window most days. The need to choose "clean , green" food 50 to 75% of the time is discussed, as well as to make water the primary drink and set a goal of 64 ounces water daily.    Weight /BMI 06/13/2021 06/05/2021 01/18/2021  WEIGHT 219 lb 12.8 oz  221 lb 3.2 oz 221 lb 6.4 oz  HEIGHT '5\' 6"'$  '5\' 6"'$  '5\' 6"'$   BMI 35.48 kg/m2 35.7 kg/m2 35.73 kg/m2

## 2021-06-14 NOTE — Assessment & Plan Note (Signed)
Controlled and managed by Endo, recent lab within normal.

## 2021-06-14 NOTE — Assessment & Plan Note (Signed)
Hyperlipidemia:Low fat diet discussed and encouraged.   Lipid Panel  Lab Results  Component Value Date   CHOL 180 06/13/2021   HDL 50 06/13/2021   LDLCALC WILL FOLLOW 06/13/2021   TRIG WILL FOLLOW 06/13/2021   CHOLHDL 3.6 06/13/2021   Awaiting LDL but appears controlled

## 2021-06-14 NOTE — Assessment & Plan Note (Signed)
No current or recent flare of pain

## 2021-06-15 LAB — CMP14+EGFR
ALT: 11 IU/L (ref 0–32)
AST: 16 IU/L (ref 0–40)
Albumin/Globulin Ratio: 1.9 (ref 1.2–2.2)
Albumin: 4.1 g/dL (ref 3.6–4.6)
Alkaline Phosphatase: 83 IU/L (ref 44–121)
BUN/Creatinine Ratio: 15 (ref 12–28)
BUN: 13 mg/dL (ref 8–27)
Bilirubin Total: 0.4 mg/dL (ref 0.0–1.2)
CO2: 22 mmol/L (ref 20–29)
Calcium: 9.1 mg/dL (ref 8.7–10.3)
Chloride: 104 mmol/L (ref 96–106)
Creatinine, Ser: 0.88 mg/dL (ref 0.57–1.00)
Globulin, Total: 2.2 g/dL (ref 1.5–4.5)
Glucose: 86 mg/dL (ref 65–99)
Potassium: 4.4 mmol/L (ref 3.5–5.2)
Sodium: 142 mmol/L (ref 134–144)
Total Protein: 6.3 g/dL (ref 6.0–8.5)
eGFR: 64 mL/min/{1.73_m2} (ref >59)

## 2021-06-15 LAB — CBC
Hematocrit: 43.7 % (ref 34.0–46.6)
Hemoglobin: 14 g/dL (ref 11.1–15.9)
MCH: 26.2 pg — ABNORMAL LOW (ref 26.6–33.0)
MCHC: 32 g/dL (ref 31.5–35.7)
MCV: 82 fL (ref 79–97)
Platelets: 334 10*3/uL (ref 150–450)
RBC: 5.35 x10E6/uL — ABNORMAL HIGH (ref 3.77–5.28)
RDW: 13.7 % (ref 11.7–15.4)
WBC: 7.5 10*3/uL (ref 3.4–10.8)

## 2021-06-15 LAB — VITAMIN D 25 HYDROXY (VIT D DEFICIENCY, FRACTURES): Vit D, 25-Hydroxy: 24.4 ng/mL — ABNORMAL LOW (ref 30.0–100.0)

## 2021-06-15 LAB — LIPID PANEL
Chol/HDL Ratio: 3.6 ratio (ref 0.0–4.4)
Cholesterol, Total: 180 mg/dL (ref 100–199)
HDL: 50 mg/dL (ref >39)
LDL Chol Calc (NIH): 117 mg/dL — ABNORMAL HIGH (ref 0–99)
Triglycerides: 68 mg/dL (ref 0–149)
VLDL Cholesterol Cal: 13 mg/dL (ref 5–40)

## 2021-06-15 LAB — HEMOGLOBIN A1C
Est. average glucose Bld gHb Est-mCnc: 126 mg/dL
Hgb A1c MFr Bld: 6 % — ABNORMAL HIGH (ref 4.8–5.6)

## 2021-06-22 ENCOUNTER — Ambulatory Visit (INDEPENDENT_AMBULATORY_CARE_PROVIDER_SITE_OTHER): Payer: Medicare PPO

## 2021-06-22 ENCOUNTER — Other Ambulatory Visit: Payer: Self-pay

## 2021-06-22 DIAGNOSIS — B351 Tinea unguium: Secondary | ICD-10-CM

## 2021-06-22 NOTE — Progress Notes (Signed)
Patient presents today for the 3rd laser treatment. Diagnosed with mycotic nail infection by Dr. Cannon Kettle.   Toenail most affected are all ten nails. No change in the nails as of yet.  All other systems are negative.  Nails were filed thin. Laser therapy was administered to 1-5  bilateral toenails  and patient tolerated the treatment well. All safety precautions were in place.    Follow up in 6 weeks for laser # 4.

## 2021-06-27 ENCOUNTER — Other Ambulatory Visit: Payer: Self-pay

## 2021-06-27 ENCOUNTER — Ambulatory Visit (HOSPITAL_COMMUNITY)
Admission: RE | Admit: 2021-06-27 | Discharge: 2021-06-27 | Disposition: A | Discharge status: Home / Self Care | Payer: Medicare PPO | Source: Ambulatory Visit | Attending: Family Medicine | Admitting: Family Medicine

## 2021-06-27 DIAGNOSIS — Z1231 Encounter for screening mammogram for malignant neoplasm of breast: Secondary | ICD-10-CM | POA: Diagnosis not present

## 2021-07-04 ENCOUNTER — Other Ambulatory Visit: Payer: Self-pay | Admitting: "Endocrinology

## 2021-07-09 ENCOUNTER — Encounter: Payer: Medicare PPO | Admitting: Family Medicine

## 2021-07-09 DIAGNOSIS — H401131 Primary open-angle glaucoma, bilateral, mild stage: Secondary | ICD-10-CM | POA: Diagnosis not present

## 2021-07-24 ENCOUNTER — Encounter: Payer: Self-pay | Admitting: Family Medicine

## 2021-07-24 ENCOUNTER — Ambulatory Visit (HOSPITAL_COMMUNITY)
Admission: RE | Admit: 2021-07-24 | Discharge: 2021-07-24 | Disposition: A | Discharge status: Home / Self Care | Payer: Medicare PPO | Source: Ambulatory Visit | Attending: Family Medicine | Admitting: Family Medicine

## 2021-07-24 ENCOUNTER — Other Ambulatory Visit: Payer: Self-pay

## 2021-07-24 ENCOUNTER — Encounter (INDEPENDENT_AMBULATORY_CARE_PROVIDER_SITE_OTHER): Payer: Self-pay

## 2021-07-24 ENCOUNTER — Ambulatory Visit (INDEPENDENT_AMBULATORY_CARE_PROVIDER_SITE_OTHER): Payer: Medicare PPO | Admitting: Family Medicine

## 2021-07-24 ENCOUNTER — Other Ambulatory Visit: Payer: Self-pay | Admitting: Family Medicine

## 2021-07-24 VITALS — BP 143/62 | HR 61 | Resp 17 | Ht 67.0 in | Wt 221.0 lb

## 2021-07-24 DIAGNOSIS — E785 Hyperlipidemia, unspecified: Secondary | ICD-10-CM | POA: Diagnosis not present

## 2021-07-24 DIAGNOSIS — Z0001 Encounter for general adult medical examination with abnormal findings: Secondary | ICD-10-CM | POA: Diagnosis not present

## 2021-07-24 DIAGNOSIS — Z23 Encounter for immunization: Secondary | ICD-10-CM | POA: Diagnosis not present

## 2021-07-24 DIAGNOSIS — Z0000 Encounter for general adult medical examination without abnormal findings: Secondary | ICD-10-CM

## 2021-07-24 DIAGNOSIS — M549 Dorsalgia, unspecified: Secondary | ICD-10-CM | POA: Insufficient documentation

## 2021-07-24 DIAGNOSIS — M545 Low back pain, unspecified: Secondary | ICD-10-CM | POA: Diagnosis not present

## 2021-07-24 MED ORDER — IBUPROFEN 600 MG PO TABS: ORAL_TABLET | ORAL | 0 refills | Status: DC

## 2021-07-24 NOTE — Assessment & Plan Note (Addendum)
Ibuprofen 600 mg twice daily as need, for back pain, and tylenol two daily

## 2021-07-24 NOTE — Assessment & Plan Note (Signed)

## 2021-07-24 NOTE — Progress Notes (Signed)
    Tammy Galvan     MRN: [id number]      DOB: 1932-11-03  HPI: Patient is in for annual physical exam. No other health concerns are expressed or addressed at the visit. Recent labs,  are reviewed. Immunization is reviewed , and  updated if needed.   PE: BP (!) 143/62 (BP Location: Right Arm, Patient Position: Sitting)   Pulse 61   Resp 17   Ht 5\' 7"  (1.702 m)   Wt 221 lb (100.2 kg)   SpO2 98%   BMI 34.61 kg/m   Pleasant  female, alert and oriented x 3, in no cardio-pulmonary distress. Afebrile. HEENT No facial trauma or asymetry. Sinuses non tender.  Extra occullar muscles intact.. External ears normal, . Neck: supple, no adenopathy,JVD or thyromegaly.No bruits.  Chest: Clear to ascultation bilaterally.No crackles or wheezes. Non tender to palpation  Breast: No asymetry,no masses or lumps. No tenderness. No nipple discharge or inversion. No axillary or supraclavicular adenopathy  Cardiovascular system; Heart sounds normal,  S1 and  S2 ,no S3.  No murmur, or thrill. Apical beat not displaced Peripheral pulses normal.  Abdomen: Soft, non tender, no organomegaly or masses. No bruits. Bowel sounds normal. No guarding, tenderness or rebound.   GU: External genitalia normal female genitalia , normal female distribution of hair. No lesions. Urethral meatus normal in size, no  Prolapse, no lesions visibly  Present. Bladder non tender. Vagina pink and moist , with no visible lesions , discharge present . Adequate pelvic support no  cystocele or rectocele noted Cervix pink and appears healthy, no lesions or ulcerations noted, no discharge noted from os Uterus normal size, no adnexal masses, no cervical motion or adnexal tenderness.   Musculoskeletal exam: Full ROM of spine, hips , shoulders and knees. No deformity ,swelling or crepitus noted. No muscle wasting or atrophy.   Neurologic: Cranial nerves 2 to 12 intact. Power, tone ,sensation and reflexes normal  throughout. No disturbance in gait. No tremor.  Skin: Intact, no ulceration, erythema , scaling or rash noted. Pigmentation normal throughout  Psych; Normal mood and affect. Judgement and concentration normal   Assessment & Plan:  Annual physical exam Annual exam as documented. Counseling done  re healthy lifestyle involving commitment to 150 minutes exercise per week, heart healthy diet, and attaining healthy weight.The importance of adequate sleep also discussed. Regular seat belt use and home safety, is also discussed. Changes in health habits are decided on by the patient with goals and time frames  set for achieving them. Immunization and cancer screening needs are specifically addressed at this visit.   Back pain with radiation Ibuprofen 600 mg twice daily as need, for back pain, and tylenol two daily

## 2021-07-24 NOTE — Patient Instructions (Signed)
Follow-up in office with MD in mid February call if you need me sooner.  Flu vaccine in office today.  For back pain ibuprofen 600 mg as prescribed take no more than 2 tablets in any 1 daily as needed for back pain.  Use extra strength Tylenol 1 tablet twice daily for arthritic pain.  Please get an x-ray of your back today when you leave the office.    Fasting lipid CMP and EGFR 3 days before next visit.  Think about what you will eat, plan ahead. Choose " clean, green, fresh or frozen" over canned, processed or packaged foods which are more sugary, salty and fatty. 70 to 75% of food eaten should be vegetables and fruit. Three meals at set times with snacks allowed between meals, but they must be fruit or vegetables. Aim to eat over a 12 hour period , example 7 am to 7 pm, and STOP after  your last meal of the day. Drink water,generally about 64 ounces per day, no other drink is as healthy. Fruit juice is best enjoyed in a healthy way, by EATING the fruit.  It is important that you exercise regularly at least 30 minutes 5 times a week. If you develop chest pain, have severe difficulty breathing, or feel very tired, stop exercising immediately and seek medical attention  Thanks for choosing [facility], we consider it a privelige to serve you.

## 2021-08-10 ENCOUNTER — Ambulatory Visit (INDEPENDENT_AMBULATORY_CARE_PROVIDER_SITE_OTHER): Payer: Self-pay

## 2021-08-10 ENCOUNTER — Other Ambulatory Visit: Payer: Self-pay

## 2021-08-10 DIAGNOSIS — B351 Tinea unguium: Secondary | ICD-10-CM

## 2021-08-10 NOTE — Progress Notes (Signed)
Patient presents today for the 4th laser treatment. Diagnosed with mycotic nail infection by Dr. Cannon Kettle.   Toenail most affected are all ten nails. No change in the nails as of yet.  All other systems are negative.  Nails were filed thin. Laser therapy was administered to 1-5  bilateral toenails  and patient tolerated the treatment well. All safety precautions were in place.    Follow up in 6 weeks for laser # 5.

## 2021-08-12 ENCOUNTER — Other Ambulatory Visit: Payer: Self-pay | Admitting: Family Medicine

## 2021-08-22 ENCOUNTER — Ambulatory Visit (INDEPENDENT_AMBULATORY_CARE_PROVIDER_SITE_OTHER): Payer: Medicare PPO

## 2021-08-22 ENCOUNTER — Other Ambulatory Visit: Payer: Self-pay

## 2021-08-22 DIAGNOSIS — Z0000 Encounter for general adult medical examination without abnormal findings: Secondary | ICD-10-CM | POA: Diagnosis not present

## 2021-08-22 NOTE — Progress Notes (Signed)
Subjective:   Tammy Galvan is a 87 y.o. female who presents for Medicare Annual (Subsequent) preventive examination.  I connected with  [name] on 08/22/21 by a audio enabled telemedicine application and verified that I am speaking with the correct person using two identifiers.   I discussed the limitations, risks, security and privacy concerns of performing an evaluation and management service by telephone and the availability of in person appointments. I also discussed with the patient that there may be a patient responsible charge related to this service. The patient expressed understanding and verbally consented to this telephonic visit.   Review of Systems           Objective:    There were no vitals filed for this visit. There is no height or weight on file to calculate BMI.  Advanced Directives 12/22/2020 08/09/2020 08/06/2019 02/17/2017 07/01/2012  Does Patient Have a Medical Advance Directive? No No No No Patient does not have advance directive;Patient would not like information  Would patient like information on creating a medical advance directive? No - Patient declined No - Patient declined No - Patient declined Yes (MAU/Ambulatory/Procedural Areas - Information given) -    Current Medications (verified) Outpatient Encounter Medications as of 08/22/2021  Medication Sig   amLODipine (NORVASC) 10 MG tablet TAKE 1 TABLET BY MOUTH EVERY DAY   aspirin 325 MG tablet Take 325 mg by mouth daily. (Patient not taking: Reported on 07/24/2021)   brimonidine-timolol (COMBIGAN) 0.2-0.5 % ophthalmic solution Place 1 drop into both eyes 2 (two) times daily.   hydrochlorothiazide (HYDRODIURIL) 25 MG tablet TAKE 1 TABLET BY MOUTH EVERY DAY   ibuprofen (ADVIL) 600 MG tablet TAKE ONE TABLET BY MOUTH TWO TIMES DAILY AS NEEDED, FOR BACK PAIN   KLOR-CON M20 20 MEQ tablet TAKE 1 TABLET BY MOUTH EVERY DAY   levothyroxine (SYNTHROID) 75 MCG tablet USE AS DIRECTED   metoprolol succinate  (TOPROL-XL) 25 MG 24 hr tablet TAKE 1 TABLET BY MOUTH EVERY DAY   pravastatin (PRAVACHOL) 10 MG tablet TAKE 1 TABLET BY MOUTH EVERY DAY   No facility-administered encounter medications on file as of 08/22/2021.    Allergies (verified) Iodine, Ace inhibitors, and Hydrochlorothiazide   History: Past Medical History:  Diagnosis Date   Glaucoma 2010   Hyperlipidemia    Hypertension    Obesity    Prediabetes 2013   Thyroid disease    TIA (transient ischemic attack)    Past Surgical History:  Procedure Laterality Date   ABDOMINAL HYSTERECTOMY  1986   CATARACT EXTRACTION     COLONOSCOPY  07/01/2012   Procedure: COLONOSCOPY;  Surgeon: Rogene Houston, MD;  Location: [facility];  Service: Endoscopy;  Laterality: N/A;  830   EYE SURGERY      bilateral cataract exrtaction   FRACTURE SURGERY     right ankle and left ankle    Family History  Problem Relation Age of Onset   Arthritis Mother    Diabetes Father    Heart disease Father    Stroke Sister    Diabetes Sister    Pulmonary embolism Sister    Kidney disease Brother        on dialysis   Kidney failure Brother    Social History   Socioeconomic History   Marital status: Widowed    Spouse name: Not on file   Number of children: Not on file   Years of education: Not on file   Highest education level: Not  on file  Occupational History   Occupation: Retired   Tobacco Use   Smoking status: Never   Smokeless tobacco: Never  Vaping Use   Vaping Use: Never used  Substance and Sexual Activity   Alcohol use: No   Drug use: No   Sexual activity: Never    Birth control/protection: Surgical  Other Topics Concern   Not on file  Social History Narrative   Not on file   Social Determinants of Health   Financial Resource Strain: Not on file  Food Insecurity: Not on file  Transportation Needs: Not on file  Physical Activity: Not on file  Stress: Not on file  Social Connections: Not on file    Tobacco  Counseling Counseling given: Not Answered   Clinical Intake:                 Diabetic?No         Activities of Daily Living No flowsheet data found.  Patient Care Team: Fayrene Helper, MD as PCP - General  Indicate any recent Medical Services you may have received from other than [facility] providers in the past year (date may be approximate).     Assessment:   This is a routine wellness examination for [name].  Hearing/Vision screen No results found.  Dietary issues and exercise activities discussed:     Goals Addressed   None   Depression Screen PHQ 2/9 Scores 07/24/2021 06/13/2021 01/18/2021 10/27/2020 08/09/2020 08/09/2020 06/27/2020  PHQ - 2 Score 0 0 0 0 0 0 0  PHQ- 9 Score 1 - - - - - -    Fall Risk Fall Risk  07/24/2021 06/13/2021 01/18/2021 12/25/2020 10/27/2020  Falls in the past year? 1 1 1  0 0  Number falls in past yr: 0 0 0 0 0  Injury with Fall? 1 0 1 0 0  Comment sprained ankle - - - -  Risk for fall due to : - - History of fall(s) - No Fall Risks  Follow up - - Falls evaluation completed;Education provided;Falls prevention discussed;Follow up appointment - Falls evaluation completed    FALL RISK PREVENTION PERTAINING TO THE HOME:  Any stairs in or around the home? Yes  If so, are there any without handrails? Yes  Home free of loose throw rugs in walkways, pet beds, electrical cords, etc? No  Adequate lighting in your home to reduce risk of falls? Yes   ASSISTIVE DEVICES UTILIZED TO PREVENT FALLS:  Life alert? Yes  Use of a cane, walker or w/c? No  Grab bars in the bathroom? No  Shower chair or bench in shower? Yes  Elevated toilet seat or a handicapped toilet? Yes   TIMED UP AND GO:  Was the test performed? No .  Length of time to ambulate 10 feet: n/a sec.     Cognitive Function:     6CIT Screen 08/09/2020 08/06/2019 07/27/2018 02/17/2017  What Year? 0 points 0 points 0 points 0 points  What month? 0 points 0 points 0 points 0 points   What time? 0 points 0 points 0 points 0 points  Count back from 20 0 points 0 points 0 points 0 points  Months in reverse 0 points 0 points 0 points 0 points  Repeat phrase 0 points 0 points 0 points 0 points  Total Score 0 0 0 0    Immunizations Immunization History  Administered Date(s) Administered   Fluad Quad(high Dose 65+) 07/21/2019, 06/27/2020, 07/24/2021   Influenza Split 08/30/2011,  07/08/2012   Influenza Whole 08/05/2007, 06/26/2010   Influenza, High Dose Seasonal PF 07/27/2018   Influenza,inj,Quad PF,6+ Mos 07/15/2013, 06/14/2014, 10/19/2015, 08/08/2016, 07/29/2017   Moderna Sars-Covid-2 Vaccination 12/16/2019, 01/14/2020, 07/21/2020   Pneumococcal Conjugate-13 06/15/2015   Pneumococcal Polysaccharide-23 04/03/2009   Td 06/18/2007    TDAP status: Due, Education has been provided regarding the importance of this vaccine. Advised may receive this vaccine at local pharmacy or Health Dept. Aware to provide a copy of the vaccination record if obtained from local pharmacy or Health Dept. Verbalized acceptance and understanding.  Flu Vaccine status: Up to date  Pneumococcal vaccine status: Up to date  Covid-19 vaccine status: Completed vaccines  Qualifies for Shingles Vaccine? Yes   Zostavax completed Yes   Shingrix Completed?: Yes  Screening Tests Health Maintenance  Topic Date Due   COVID-19 Vaccine (4 - Booster for Moderna series) 09/15/2020   Zoster Vaccines- Shingrix (2 of 2) 07/30/2021   TETANUS/TDAP  01/18/2022 (Originally 06/17/2017)   Pneumonia Vaccine 7+ Years old  Completed   INFLUENZA VACCINE  Completed   DEXA SCAN  Completed   HPV VACCINES  Aged Out    Health Maintenance  Health Maintenance Due  Topic Date Due   COVID-19 Vaccine (4 - Booster for Moderna series) 09/15/2020   Zoster Vaccines- Shingrix (2 of 2) 07/30/2021    Colorectal cancer screening: No longer required.   Mammogram status: No longer required due to age.  Bone Density  status: Completed 03/18/2014. Results reflect: Bone density results: NORMAL. Repeat every 5 years.  Lung Cancer Screening: (Low Dose CT Chest recommended if Age 68-80 years, 30 pack-year currently smoking OR have quit w/in 15years.) does not qualify.   Lung Cancer Screening Referral: n/a  Additional Screening:  Hepatitis C Screening: does qualify; Completed no  Vision Screening: Recommended annual ophthalmology exams for early detection of glaucoma and other disorders of the eye. Is the patient up to date with their annual eye exam?  Yes  Who is the provider or what is the name of the office in which the patient attends annual eye exams? [facility]  If pt is not established with a provider, would they like to be referred to a provider to establish care? No .   Dental Screening: Recommended annual dental exams for proper oral hygiene  Community Resource Referral / Chronic Care Management: CRR required this visit?  No   CCM required this visit?  No      Plan:     I have personally reviewed and noted the following in the patient's chart:   Medical and social history Use of alcohol, tobacco or illicit drugs  Current medications and supplements including opioid prescriptions.  Functional ability and status Nutritional status Physical activity Advanced directives List of other physicians Hospitalizations, surgeries, and ER visits in previous 12 months Vitals Screenings to include cognitive, depression, and falls Referrals and appointments  In addition, I have reviewed and discussed with patient certain preventive protocols, quality metrics, and best practice recommendations. A written personalized care plan for preventive services as well as general preventive health recommendations were provided to patient.     Quentin Angst, [place]   08/22/2021   Nurse Notes: This is a tele health visit with the patient at home. The provider is in the office and is Ihor Dow,  MD

## 2021-08-22 NOTE — Patient Instructions (Signed)
[name] , Thank you for taking time to come for your Medicare Wellness Visit. I appreciate your ongoing commitment to your health goals. Please review the following plan we discussed and let me know if I can assist you in the future.   Screening recommendations/referrals: Colonoscopy: not due  Mammogram: not due  Bone Density: complete Recommended yearly ophthalmology/optometry visit for glaucoma screening and checkup Recommended yearly dental visit for hygiene and checkup  Vaccinations: Influenza vaccine: complete Pneumococcal vaccine: complete Tdap vaccine: due now Shingles vaccine: complete    Advanced directives: yes   Conditions/risks identified: hypertension  Next appointment: 1 year    Preventive Care 56 Years and Older, Female Preventive care refers to lifestyle choices and visits with your health care provider that can promote health and wellness. What does preventive care include? A yearly physical exam. This is also called an annual well check. Dental exams once or twice a year. Routine eye exams. Ask your health care provider how often you should have your eyes checked. Personal lifestyle choices, including: Daily care of your teeth and gums. Regular physical activity. Eating a healthy diet. Avoiding tobacco and drug use. Limiting alcohol use. Practicing safe sex. Taking low-dose aspirin every day. Taking vitamin and mineral supplements as recommended by your health care provider. What happens during an annual well check? The services and screenings done by your health care provider during your annual well check will depend on your age, overall health, lifestyle risk factors, and family history of disease. Counseling  Your health care provider may ask you questions about your: Alcohol use. Tobacco use. Drug use. Emotional well-being. Home and relationship well-being. Sexual activity. Eating habits. History of falls. Memory and ability to understand  (cognition). Work and work Statistician. Reproductive health. Screening  You may have the following tests or measurements: Height, weight, and BMI. Blood pressure. Lipid and cholesterol levels. These may be checked every 5 years, or more frequently if you are over 30 years old. Skin check. Lung cancer screening. You may have this screening every year starting at age 85 if you have a 30-pack-year history of smoking and currently smoke or have quit within the past 15 years. Fecal occult blood test (FOBT) of the stool. You may have this test every year starting at age 11. Flexible sigmoidoscopy or colonoscopy. You may have a sigmoidoscopy every 5 years or a colonoscopy every 10 years starting at age 9. Hepatitis C blood test. Hepatitis B blood test. Sexually transmitted disease (STD) testing. Diabetes screening. This is done by checking your blood sugar (glucose) after you have not eaten for a while (fasting). You may have this done every 1-3 years. Bone density scan. This is done to screen for osteoporosis. You may have this done starting at age 86. Mammogram. This may be done every 1-2 years. Talk to your health care provider about how often you should have regular mammograms. Talk with your health care provider about your test results, treatment options, and if necessary, the need for more tests. Vaccines  Your health care provider may recommend certain vaccines, such as: Influenza vaccine. This is recommended every year. Tetanus, diphtheria, and acellular pertussis (Tdap, Td) vaccine. You may need a Td booster every 10 years. Zoster vaccine. You may need this after age 72. Pneumococcal 13-valent conjugate (PCV13) vaccine. One dose is recommended after age 49. Pneumococcal polysaccharide (PPSV23) vaccine. One dose is recommended after age 20. Talk to your health care provider about which screenings and vaccines you need and how  often you need them. This information is not intended to  replace advice given to you by your health care provider. Make sure you discuss any questions you have with your health care provider. Document Released: 11/03/2015 Document Revised: 06/26/2016 Document Reviewed: 08/08/2015 Elsevier Interactive Patient Education  2017 [employer] Prevention in the Home Falls can cause injuries. They can happen to people of all ages. There are many things you can do to make your home safe and to help prevent falls. What can I do on the outside of my home? Regularly fix the edges of walkways and driveways and fix any cracks. Remove anything that might make you trip as you walk through a door, such as a raised step or threshold. Trim any bushes or trees on the path to your home. Use bright outdoor lighting. Clear any walking paths of anything that might make someone trip, such as rocks or tools. Regularly check to see if handrails are loose or broken. Make sure that both sides of any steps have handrails. Any raised decks and porches should have guardrails on the edges. Have any leaves, snow, or ice cleared regularly. Use sand or salt on walking paths during winter. Clean up any spills in your garage right away. This includes oil or grease spills. What can I do in the bathroom? Use night lights. Install grab bars by the toilet and in the tub and shower. Do not use towel bars as grab bars. Use non-skid mats or decals in the tub or shower. If you need to sit down in the shower, use a plastic, non-slip stool. Keep the floor dry. Clean up any water that spills on the floor as soon as it happens. Remove soap buildup in the tub or shower regularly. Attach bath mats securely with double-sided non-slip rug tape. Do not have throw rugs and other things on the floor that can make you trip. What can I do in the bedroom? Use night lights. Make sure that you have a light by your bed that is easy to reach. Do not use any sheets or blankets that are too big for  your bed. They should not hang down onto the floor. Have a firm chair that has side arms. You can use this for support while you get dressed. Do not have throw rugs and other things on the floor that can make you trip. What can I do in the kitchen? Clean up any spills right away. Avoid walking on wet floors. Keep items that you use a lot in easy-to-reach places. If you need to reach something above you, use a strong step stool that has a grab bar. Keep electrical cords out of the way. Do not use floor polish or wax that makes floors slippery. If you must use wax, use non-skid floor wax. Do not have throw rugs and other things on the floor that can make you trip. What can I do with my stairs? Do not leave any items on the stairs. Make sure that there are handrails on both sides of the stairs and use them. Fix handrails that are broken or loose. Make sure that handrails are as long as the stairways. Check any carpeting to make sure that it is firmly attached to the stairs. Fix any carpet that is loose or worn. Avoid having throw rugs at the top or bottom of the stairs. If you do have throw rugs, attach them to the floor with carpet tape. Make sure that you have a  light switch at the top of the stairs and the bottom of the stairs. If you do not have them, ask someone to add them for you. What else can I do to help prevent falls? Wear shoes that: Do not have high heels. Have rubber bottoms. Are comfortable and fit you well. Are closed at the toe. Do not wear sandals. If you use a stepladder: Make sure that it is fully opened. Do not climb a closed stepladder. Make sure that both sides of the stepladder are locked into place. Ask someone to hold it for you, if possible. Clearly mark and make sure that you can see: Any grab bars or handrails. First and last steps. Where the edge of each step is. Use tools that help you move around (mobility aids) if they are needed. These  include: Canes. Walkers. Scooters. Crutches. Turn on the lights when you go into a dark area. Replace any light bulbs as soon as they burn out. Set up your furniture so you have a clear path. Avoid moving your furniture around. If any of your floors are uneven, fix them. If there are any pets around you, be aware of where they are. Review your medicines with your doctor. Some medicines can make you feel dizzy. This can increase your chance of falling. Ask your doctor what other things that you can do to help prevent falls. This information is not intended to replace advice given to you by your health care provider. Make sure you discuss any questions you have with your health care provider. Document Released: 08/03/2009 Document Revised: 03/14/2016 Document Reviewed: 11/11/2014 Elsevier Interactive Patient Education  2017 [employer].

## 2021-09-10 ENCOUNTER — Other Ambulatory Visit: Payer: Self-pay | Admitting: Family Medicine

## 2021-09-21 ENCOUNTER — Ambulatory Visit (INDEPENDENT_AMBULATORY_CARE_PROVIDER_SITE_OTHER): Payer: Medicare PPO

## 2021-09-21 ENCOUNTER — Other Ambulatory Visit: Payer: Self-pay

## 2021-09-21 DIAGNOSIS — B351 Tinea unguium: Secondary | ICD-10-CM

## 2021-09-21 NOTE — Progress Notes (Signed)
Patient presents today for the 5th laser treatment. Diagnosed with mycotic nail infection by Dr. Cannon Kettle.   Toenail most affected are all ten nails. No change in the nails as of yet.  All other systems are negative.  Nails were filed thin. Laser therapy was administered to 1-5  bilateral toenails  and patient tolerated the treatment well. All safety precautions were in place.    Follow up in 8 weeks for laser # 6.

## 2021-09-24 ENCOUNTER — Other Ambulatory Visit: Payer: Self-pay | Admitting: Family Medicine

## 2021-09-30 ENCOUNTER — Other Ambulatory Visit: Payer: Self-pay | Admitting: "Endocrinology

## 2021-10-03 ENCOUNTER — Telehealth: Payer: Self-pay | Admitting: Family Medicine

## 2021-10-03 NOTE — Telephone Encounter (Signed)
Pt called in regard to cortisone shot   Pt states that she Is having knee pain and states that she has had shot in the past.

## 2021-10-03 NOTE — Telephone Encounter (Signed)
If she is talking about a cortisone shot in her knee- orthopedics does those. If she is just wanting a steroid shot she will need an appt

## 2021-10-10 ENCOUNTER — Ambulatory Visit: Payer: Medicare PPO | Admitting: Family Medicine

## 2021-10-10 ENCOUNTER — Other Ambulatory Visit: Payer: Self-pay

## 2021-10-10 ENCOUNTER — Ambulatory Visit (HOSPITAL_COMMUNITY)
Admission: RE | Admit: 2021-10-10 | Discharge: 2021-10-10 | Disposition: A | Discharge status: Home / Self Care | Payer: Medicare PPO | Source: Ambulatory Visit | Attending: Family Medicine | Admitting: Family Medicine

## 2021-10-10 VITALS — BP 147/72 | HR 74 | Resp 16 | Ht 67.0 in | Wt 216.0 lb

## 2021-10-10 DIAGNOSIS — I10 Essential (primary) hypertension: Secondary | ICD-10-CM | POA: Diagnosis not present

## 2021-10-10 DIAGNOSIS — M25562 Pain in left knee: Secondary | ICD-10-CM

## 2021-10-10 DIAGNOSIS — M7989 Other specified soft tissue disorders: Secondary | ICD-10-CM | POA: Diagnosis not present

## 2021-10-10 MED ORDER — METHYLPREDNISOLONE ACETATE 80 MG/ML IJ SUSP
40.0000 mg | Freq: Once | INTRAMUSCULAR | Status: AC
  Administered 2021-10-10: 17:00:00 40 mg via INTRAMUSCULAR

## 2021-10-10 MED ORDER — PREDNISONE 10 MG PO TABS: 10.0000 mg | ORAL_TABLET | Freq: Two times a day (BID) | ORAL | 0 refills | Status: DC

## 2021-10-10 MED ORDER — KETOROLAC TROMETHAMINE 60 MG/2ML IM SOLN
60.0000 mg | Freq: Once | INTRAMUSCULAR | Status: AC
  Administered 2021-10-10: 17:00:00 60 mg via INTRAMUSCULAR

## 2021-10-10 NOTE — Patient Instructions (Addendum)
F/U as before, call if you need me sooner  Xray of left knee today  Toradol 60 mg and Depo Medrol 40 mg IM in office and 5 day course of prednisone is prescribed  If not improved significantly by next week, call for Ortho referral please  Careful not to fall  Thanks for choosing [facility], we consider it a privelige to serve you.

## 2021-10-15 ENCOUNTER — Encounter: Payer: Self-pay | Admitting: Family Medicine

## 2021-10-15 NOTE — Assessment & Plan Note (Signed)
Elevated at visit , however, pt in significant pain, no med change

## 2021-10-15 NOTE — Progress Notes (Signed)
° °  Tammy Galvan     MRN: [id number]      DOB: Apr 01, 1933   HPI Tammy Galvan is here with a 3 week h/o left knee pain and  swelling, no direct trauma, knee also feels warm and is swollen, difficult to weight bear  ROS Denies recent fever or chills. Denies sinus pressure, nasal congestion, ear pain or sore throat. Denies chest congestion, productive cough or wheezing. Denies chest pains, palpitations and leg swelling  PE  BP (!) 147/72    Pulse 74    Resp 16    Ht 5\' 7"  (1.702 m)    Wt 216 lb (98 kg)    SpO2 96%    BMI 33.83 kg/m   Patient alert and oriented and in no cardiopulmonary distress.Pt in pain and unable to weight bear without cane  HEENT: No facial asymmetry, EOMI,     Neck supple .  Chest: Clear to auscultation bilaterally.  CVS: S1, S2 no murmurs, no S3.Regular rate.  er.   Ext: No edema  MS: Adequate though reduced ROM lumbar spine, normal in shoulders, and markedly reduced in left knee which is warm , red , tender and swollen.  Skin: Intact, no ulcerations or rash noted.  Psych: Good eye contact, normal affect. Memory intact not anxious or depressed appearing.  CNS: CN 2-12 intact, power,  normal throughout.no focal deficits noted.   Assessment & Plan  Left anterior knee pain Uncontrolled.Toradol and depo medrol administered IM in the office , to be followed by a short course of oral prednisone , also x ray of knee, will refer to Ortho if persists   Essential hypertension Elevated at visit , however, pt in significant pain, no med change

## 2021-10-15 NOTE — Assessment & Plan Note (Addendum)
Uncontrolled.Toradol and depo medrol administered IM in the office , to be followed by a short course of oral prednisone , also x ray of knee, will refer to Ortho if persists

## 2021-11-08 DIAGNOSIS — H401131 Primary open-angle glaucoma, bilateral, mild stage: Secondary | ICD-10-CM | POA: Diagnosis not present

## 2021-11-09 ENCOUNTER — Other Ambulatory Visit: Payer: Medicare PPO

## 2021-11-16 ENCOUNTER — Other Ambulatory Visit: Payer: Medicare PPO

## 2021-11-30 ENCOUNTER — Ambulatory Visit (INDEPENDENT_AMBULATORY_CARE_PROVIDER_SITE_OTHER): Payer: Self-pay

## 2021-11-30 ENCOUNTER — Other Ambulatory Visit: Payer: Self-pay

## 2021-11-30 DIAGNOSIS — B351 Tinea unguium: Secondary | ICD-10-CM

## 2021-11-30 NOTE — Progress Notes (Signed)
Patient presents today for the 6th laser treatment. Diagnosed with mycotic nail infection by Dr. Cannon Kettle.   Toenail most affected are all ten nails. No change in the nails as of yet.  All other systems are negative.  Nails were filed thin. Laser therapy was administered to 1-5  bilateral toenails  and patient tolerated the treatment well. All safety precautions were in place.    Patient has completed the recommended laser treatments. He will follow up with Dr. Cannon Kettle in 3 months to evaluate progress.

## 2021-11-30 NOTE — Patient Instructions (Signed)

## 2021-12-05 ENCOUNTER — Ambulatory Visit (INDEPENDENT_AMBULATORY_CARE_PROVIDER_SITE_OTHER): Payer: No Typology Code available for payment source | Admitting: Family Medicine

## 2021-12-05 ENCOUNTER — Encounter: Payer: Self-pay | Admitting: Family Medicine

## 2021-12-05 ENCOUNTER — Other Ambulatory Visit: Payer: Self-pay

## 2021-12-05 VITALS — BP 129/74 | HR 64 | Ht 67.0 in | Wt 218.0 lb

## 2021-12-05 DIAGNOSIS — E785 Hyperlipidemia, unspecified: Secondary | ICD-10-CM | POA: Diagnosis not present

## 2021-12-05 DIAGNOSIS — Z1231 Encounter for screening mammogram for malignant neoplasm of breast: Secondary | ICD-10-CM | POA: Diagnosis not present

## 2021-12-05 DIAGNOSIS — E559 Vitamin D deficiency, unspecified: Secondary | ICD-10-CM

## 2021-12-05 DIAGNOSIS — R7302 Impaired glucose tolerance (oral): Secondary | ICD-10-CM

## 2021-12-05 DIAGNOSIS — I10 Essential (primary) hypertension: Secondary | ICD-10-CM | POA: Diagnosis not present

## 2021-12-05 DIAGNOSIS — M25562 Pain in left knee: Secondary | ICD-10-CM

## 2021-12-05 DIAGNOSIS — M170 Bilateral primary osteoarthritis of knee: Secondary | ICD-10-CM

## 2021-12-05 DIAGNOSIS — E042 Nontoxic multinodular goiter: Secondary | ICD-10-CM | POA: Diagnosis not present

## 2021-12-05 NOTE — Patient Instructions (Addendum)
Annual exam 10/05 or after, call if you need me before  Fasting lipid, cmp and EGFR, TSH , HBA1C and Vit D, and CBC today  You are referred to Dr Luna Glasgow re left knee  Please schedule mammogram due 09/08 or after at checkout  It is important that you exercise regularly at least 30 minutes 5 times a week. If you develop chest pain, have severe difficulty breathing, or feel very tired, stop exercising immediately and seek medical attention   Think about what you will eat, plan ahead. Choose " clean, green, fresh or frozen" over canned, processed or packaged foods which are more sugary, salty and fatty. 70 to 75% of food eaten should be vegetables and fruit. Three meals at set times with snacks allowed between meals, but they must be fruit or vegetables. Aim to eat over a 12 hour period , example 7 am to 7 pm, and STOP after  your last meal of the day. Drink water,generally about 64 ounces per day, no other drink is as healthy. Fruit juice is best enjoyed in a healthy way, by EATING the fruit.  Thanks for choosing [facility], we consider it a privelige to serve you.

## 2021-12-05 NOTE — Assessment & Plan Note (Signed)
Unchanged, still an 8 and at times feels as though it will give out, refer Ortho

## 2021-12-06 ENCOUNTER — Encounter: Payer: Self-pay | Admitting: Family Medicine

## 2021-12-06 ENCOUNTER — Ambulatory Visit (INDEPENDENT_AMBULATORY_CARE_PROVIDER_SITE_OTHER): Payer: No Typology Code available for payment source | Admitting: Orthopaedic Surgery

## 2021-12-06 ENCOUNTER — Encounter: Payer: Self-pay | Admitting: Orthopaedic Surgery

## 2021-12-06 VITALS — BP 146/72 | HR 61 | Ht 68.0 in | Wt 218.0 lb

## 2021-12-06 DIAGNOSIS — M25562 Pain in left knee: Secondary | ICD-10-CM

## 2021-12-06 DIAGNOSIS — G8929 Other chronic pain: Secondary | ICD-10-CM | POA: Diagnosis not present

## 2021-12-06 LAB — CBC
Hematocrit: 45.3 % (ref 34.0–46.6)
Hemoglobin: 14.6 g/dL (ref 11.1–15.9)
MCH: 26.8 pg (ref 26.6–33.0)
MCHC: 32.2 g/dL (ref 31.5–35.7)
MCV: 83 fL (ref 79–97)
Platelets: 308 10*3/uL (ref 150–450)
RBC: 5.45 x10E6/uL — ABNORMAL HIGH (ref 3.77–5.28)
RDW: 14.5 % (ref 11.7–15.4)
WBC: 6.9 10*3/uL (ref 3.4–10.8)

## 2021-12-06 LAB — CMP14+EGFR
ALT: 13 IU/L (ref 0–32)
AST: 17 IU/L (ref 0–40)
Albumin/Globulin Ratio: 1.7 (ref 1.2–2.2)
Albumin: 4 g/dL (ref 3.6–4.6)
Alkaline Phosphatase: 85 IU/L (ref 44–121)
BUN/Creatinine Ratio: 15 (ref 12–28)
BUN: 13 mg/dL (ref 8–27)
Bilirubin Total: 0.4 mg/dL (ref 0.0–1.2)
CO2: 25 mmol/L (ref 20–29)
Calcium: 9.4 mg/dL (ref 8.7–10.3)
Chloride: 103 mmol/L (ref 96–106)
Creatinine, Ser: 0.87 mg/dL (ref 0.57–1.00)
Globulin, Total: 2.4 g/dL (ref 1.5–4.5)
Glucose: 88 mg/dL (ref 70–99)
Potassium: 4.1 mmol/L (ref 3.5–5.2)
Sodium: 140 mmol/L (ref 134–144)
Total Protein: 6.4 g/dL (ref 6.0–8.5)
eGFR: 65 mL/min/{1.73_m2} (ref >59)

## 2021-12-06 LAB — TSH: TSH: 2.98 u[IU]/mL (ref 0.450–4.500)

## 2021-12-06 LAB — LIPID PANEL
Chol/HDL Ratio: 2.9 ratio (ref 0.0–4.4)
Cholesterol, Total: 182 mg/dL (ref 100–199)
HDL: 62 mg/dL (ref >39)
LDL Chol Calc (NIH): 109 mg/dL — ABNORMAL HIGH (ref 0–99)
Triglycerides: 60 mg/dL (ref 0–149)
VLDL Cholesterol Cal: 11 mg/dL (ref 5–40)

## 2021-12-06 LAB — HEMOGLOBIN A1C
Est. average glucose Bld gHb Est-mCnc: 126 mg/dL
Hgb A1c MFr Bld: 6 % — ABNORMAL HIGH (ref 4.8–5.6)

## 2021-12-06 LAB — VITAMIN D 25 HYDROXY (VIT D DEFICIENCY, FRACTURES): Vit D, 25-Hydroxy: 35.6 ng/mL (ref 30.0–100.0)

## 2021-12-06 NOTE — Assessment & Plan Note (Signed)
Hyperlipidemia:Low fat diet discussed and encouraged.   Lipid Panel  Lab Results  Component Value Date   CHOL 182 12/05/2021   HDL 62 12/05/2021   LDLCALC 109 (H) 12/05/2021   TRIG 60 12/05/2021   CHOLHDL 2.9 12/05/2021     Needs to reduce fat in diet

## 2021-12-06 NOTE — Progress Notes (Addendum)
My left knee hurts.  She has had pain in the left knee for several months.  She has seen Dr. Moshe Cipro for the knee pain on several occasions.  X-rays were done in December which showed some mild osteoarthritis.  She has no trauma.  She has swelling and popping.  It does not give way but feels it might. She has tried Tylenol, heat, ice and rubs with only slight relief.  Dr. Moshe Cipro gave injections into her hip last visit which helped a few days.  She is tired of hurting and limping.  She has some pain at night.  She has a limp to the left.  She has no falls, no redness, no distal swelling.  Left knee has ROM 0 to 105, crepitus, effusion, positive medial Mc[place]ray, NV intact, limp left.  Encounter Diagnosis  Name Primary?   Chronic pain of left knee Yes   I am concerned about meniscus tear.  I will get MRI.  PROCEDURE NOTE:  The patient requests injections of the left knee , verbal consent was obtained.  The left knee was prepped appropriately after time out was performed.   Sterile technique was observed and injection of 1 cc of DepoMedrol 40 mg with several cc's of plain xylocaine. Anesthesia was provided by ethyl chloride and a 20-gauge needle was used to inject the knee area. The injection was tolerated well.  A band aid dressing was applied.  The patient was advised to apply ice later today and tomorrow to the injection sight as needed.  Return in two weeks. Take one Aleve after eating twice a day.  Do not take at same time as the aspirin.  Call if any problem.  Precautions discussed.  Electronically Signed Sanjuana Kava, MD 2/16/202310:27 AM

## 2021-12-06 NOTE — Addendum Note (Signed)
Addended by: Elizabeth Sauer on: 12/06/2021 10:30 AM   Modules accepted: Orders

## 2021-12-06 NOTE — Assessment & Plan Note (Signed)
Controlled, no change in medication DASH diet and commitment to daily physical activity for a minimum of 30 minutes discussed and encouraged, as a part of hypertension management. The importance of attaining a healthy weight is also discussed.  BP/Weight 12/06/2021 12/05/2021 10/10/2021 07/24/2021 06/13/2021 06/05/2021 2/55/0016  Systolic BP 429 037 955 831 674 255 258  Diastolic BP 72 74 72 62 66 64 72  Wt. (Lbs) 218 218 216 221 219.8 221.2 221.4  BMI 33.15 34.14 33.83 34.61 35.48 35.7 35.73

## 2021-12-06 NOTE — Assessment & Plan Note (Signed)
Treated by Endo

## 2021-12-06 NOTE — Progress Notes (Signed)
° °  Tammy Galvan     MRN: [id number]      DOB: September 06, 1933   HPI [name] is here for follow up and re-evaluation of chronic medical conditions, medication management and review of any available recent lab and radiology data.  Preventive health is updated, specifically  Cancer screening and Immunization.   Left knee pain is improved but still present, no buckling or falls  ROS Denies recent fever or chills. Denies sinus pressure, nasal congestion, ear pain or sore throat. Denies chest congestion, productive cough or wheezing. Denies chest pains, palpitations and leg swelling Denies abdominal pain, nausea, vomiting,diarrhea or constipation.   Denies dysuria, frequency, hesitancy or incontinence. . Denies headaches, seizures, numbness, or tingling. Denies depression, anxiety or insomnia. Denies skin break down or rash.   PE  BP 129/74    Pulse 64    Ht 5\' 7"  (1.702 m)    Wt 218 lb (98.9 kg)    SpO2 96%    BMI 34.14 kg/m   Patient alert and oriented and in no cardiopulmonary distress.  HEENT: No facial asymmetry, EOMI,     Neck supple .  Chest: Clear to auscultation bilaterally.  CVS: S1, S2 no murmurs, no S3.Regular rate.  ABD: Soft non tender.   Ext: No edema  MS: Adequate though reduced ROM spine, shoulders, hips and markedly reduced in left knee Skin: Intact, no ulcerations or rash noted.  Psych: Good eye contact, normal affect. Memory intact not anxious or depressed appearing.  CNS: CN 2-12 intact, power,  normal throughout.no focal deficits noted.   Assessment & Plan  Left anterior knee pain Unchanged, still an 8 and at times feels as though it will give out, refer Ortho  Essential hypertension Controlled, no change in medication DASH diet and commitment to daily physical activity for a minimum of 30 minutes discussed and encouraged, as a part of hypertension management. The importance of attaining a healthy weight is also discussed.  BP/Weight 12/06/2021  12/05/2021 10/10/2021 07/24/2021 06/13/2021 06/05/2021 2/42/6834  Systolic BP 196 222 979 892 119 417 408  Diastolic BP 72 74 72 62 66 64 72  Wt. (Lbs) 218 218 216 221 219.8 221.2 221.4  BMI 33.15 34.14 33.83 34.61 35.48 35.7 35.73       Morbid obesity (HCC)  Patient re-educated about  the importance of commitment to a  minimum of 150 minutes of exercise per week as able.  The importance of healthy food choices with portion control discussed, as well as eating regularly and within a 12 hour window most days. The need to choose "clean , green" food 50 to 75% of the time is discussed, as well as to make water the primary drink and set a goal of 64 ounces water daily.    Weight /BMI 12/06/2021 12/05/2021 10/10/2021  WEIGHT 218 lb 218 lb 216 lb  HEIGHT 5\' 8"  5\' 7"  5\' 7"   BMI 33.15 kg/m2 34.14 kg/m2 33.83 kg/m2      Nontoxic multinodular goiter Treated by Endo  Hyperlipidemia LDL goal <100 Hyperlipidemia:Low fat diet discussed and encouraged.   Lipid Panel  Lab Results  Component Value Date   CHOL 182 12/05/2021   HDL 62 12/05/2021   LDLCALC 109 (H) 12/05/2021   TRIG 60 12/05/2021   CHOLHDL 2.9 12/05/2021     Needs to reduce fat in diet  Osteoarthritis of both knees Increased left knee pain and stiffness, improved somewhat but requests Ortho eval

## 2021-12-06 NOTE — Assessment & Plan Note (Signed)
°  Patient re-educated about  the importance of commitment to a  minimum of 150 minutes of exercise per week as able.  The importance of healthy food choices with portion control discussed, as well as eating regularly and within a 12 hour window most days. The need to choose "clean , green" food 50 to 75% of the time is discussed, as well as to make water the primary drink and set a goal of 64 ounces water daily.    Weight /BMI 12/06/2021 12/05/2021 10/10/2021  WEIGHT 218 lb 218 lb 216 lb  HEIGHT 5\' 8"  5\' 7"  5\' 7"   BMI 33.15 kg/m2 34.14 kg/m2 33.83 kg/m2

## 2021-12-06 NOTE — Assessment & Plan Note (Signed)
Increased left knee pain and stiffness, improved somewhat but requests Ortho eval

## 2021-12-20 ENCOUNTER — Ambulatory Visit: Payer: No Typology Code available for payment source | Admitting: Orthopaedic Surgery

## 2021-12-25 ENCOUNTER — Other Ambulatory Visit: Payer: Self-pay | Admitting: Orthopaedic Surgery

## 2021-12-25 MED ORDER — DIAZEPAM 10 MG PO TABS: 10.0000 mg | ORAL_TABLET | Freq: Once | ORAL | 0 refills | Status: DC

## 2021-12-25 MED ORDER — DIAZEPAM 10 MG PO TABS: 10.0000 mg | ORAL_TABLET | Freq: Once | ORAL | 0 refills | Status: AC

## 2021-12-25 NOTE — Addendum Note (Signed)
Addended by: Willette Pa on: 12/25/2021 08:22 AM ? ? Modules accepted: Orders ? ?

## 2021-12-26 ENCOUNTER — Ambulatory Visit (HOSPITAL_COMMUNITY)
Admission: RE | Admit: 2021-12-26 | Discharge: 2021-12-26 | Disposition: A | Discharge status: Home / Self Care | Payer: No Typology Code available for payment source | Source: Ambulatory Visit | Attending: Orthopaedic Surgery | Admitting: Orthopaedic Surgery

## 2021-12-26 ENCOUNTER — Other Ambulatory Visit: Payer: Self-pay

## 2021-12-26 DIAGNOSIS — M25562 Pain in left knee: Secondary | ICD-10-CM | POA: Diagnosis not present

## 2021-12-26 DIAGNOSIS — G8929 Other chronic pain: Secondary | ICD-10-CM

## 2021-12-27 ENCOUNTER — Ambulatory Visit: Payer: No Typology Code available for payment source | Admitting: Orthopaedic Surgery

## 2022-01-01 ENCOUNTER — Ambulatory Visit (INDEPENDENT_AMBULATORY_CARE_PROVIDER_SITE_OTHER): Payer: No Typology Code available for payment source | Admitting: Orthopaedic Surgery

## 2022-01-01 ENCOUNTER — Encounter: Payer: Self-pay | Admitting: Orthopaedic Surgery

## 2022-01-01 ENCOUNTER — Other Ambulatory Visit: Payer: Self-pay

## 2022-01-01 DIAGNOSIS — G8929 Other chronic pain: Secondary | ICD-10-CM | POA: Diagnosis not present

## 2022-01-01 DIAGNOSIS — M25562 Pain in left knee: Secondary | ICD-10-CM | POA: Diagnosis not present

## 2022-01-01 NOTE — Progress Notes (Signed)
My knee is not hurting as much. ? ?She had MRI of the left knee showing: ?IMPRESSION: ?1. Moderate to severe patellofemoral and medial compartment ?osteoarthritis. Additional full-thickness cartilage loss within the ?posterior weight-bearing lateral femoral condyle. ?2. High-grade attenuation and complex tearing of the junction of the ?body and posterior horn of the medial meniscus. ?3. Mild-to-moderate joint effusion. ? ?I have explained the findings to her.  I would like to avoid surgery and she agrees.  The injection helped and she is walking better and has less pain. ? ?Left knee has effusion, crepitus, ROM 0 to 110, medial joint line pain and positive medial Mc[place]ray, NV intact.  She has only a slight limp left. ? ?I have independently reviewed the MRI.   ? ? ? ?Encounter Diagnosis  ?Name Primary?  ? Chronic pain of left knee Yes  ? ?Continue as she is doing. ? ?Return in one month. ? ?Call if any problem. ? ?Precautions discussed. ? ?Electronically Signed ?Sanjuana Kava, MD ?3/14/202311:20 AM ? ?

## 2022-01-31 ENCOUNTER — Ambulatory Visit (INDEPENDENT_AMBULATORY_CARE_PROVIDER_SITE_OTHER): Payer: No Typology Code available for payment source | Admitting: Orthopaedic Surgery

## 2022-01-31 ENCOUNTER — Encounter: Payer: Self-pay | Admitting: Orthopaedic Surgery

## 2022-01-31 DIAGNOSIS — G8929 Other chronic pain: Secondary | ICD-10-CM | POA: Diagnosis not present

## 2022-01-31 DIAGNOSIS — M25562 Pain in left knee: Secondary | ICD-10-CM | POA: Diagnosis not present

## 2022-01-31 NOTE — Progress Notes (Signed)
I am much better. ? ?Her left knee is not hurting like it was before.  She is walking better.  She has some pain but not like before. ? ?Left knee has effusion, crepitus, ROM 0 to 110, positive medial Mc[place]ray, NV intact, no distal edema. ? ?Encounter Diagnosis  ?Name Primary?  ? Chronic pain of left knee Yes  ? ?I will see prn. ? ?Call if any problem. ? ?Precautions discussed. ? ?Electronically Signed ?Sanjuana Kava, MD ?4/13/202310:05 AM ? ?

## 2022-02-28 ENCOUNTER — Ambulatory Visit (INDEPENDENT_AMBULATORY_CARE_PROVIDER_SITE_OTHER): Payer: No Typology Code available for payment source | Admitting: Sports Medicine

## 2022-02-28 DIAGNOSIS — R600 Localized edema: Secondary | ICD-10-CM

## 2022-02-28 DIAGNOSIS — B351 Tinea unguium: Secondary | ICD-10-CM | POA: Diagnosis not present

## 2022-02-28 NOTE — Progress Notes (Signed)
Subjective: ?Tammy Galvan is a 88 y.o. female patient seen today in office for follow up on nail fungus after completing laser. Reports that her big toes still look dark and the left one is sore. States that she doesn't know why she has swelling. No other symptoms noted.  ? ?Patient Active Problem List  ? Diagnosis Date Noted  ? Left anterior knee pain 10/10/2021  ? Allergic cough 10/31/2018  ? Primary open angle glaucoma of both eyes, mild stage 01/07/2017  ? Pseudophakia 01/07/2017  ? Vitamin D deficiency 10/19/2015  ? Hypothyroidism 08/29/2015  ? Nontoxic multinodular goiter 08/15/2015  ? Abnormal thyroid stimulating hormone (TSH) level 06/15/2015  ? Osteoarthritis of both knees 11/07/2013  ? Hyperlipidemia LDL goal <100 08/31/2009  ? Morbid obesity ([place]) 10/01/2008  ? Unspecified glaucoma 01/27/2008  ? Essential hypertension 01/27/2008  ? DEGENERATIVE JOINT DISEASE, SPINE 01/27/2008  ? ? ?Current Outpatient Medications on File Prior to Visit  ?Medication Sig Dispense Refill  ? amLODipine (NORVASC) 10 MG tablet TAKE 1 TABLET BY MOUTH EVERY DAY 90 tablet 3  ? aspirin 325 MG tablet Take 325 mg by mouth daily.    ? brimonidine-timolol (COMBIGAN) 0.2-0.5 % ophthalmic solution Place 1 drop into both eyes 2 (two) times daily.    ? diazepam (VALIUM) 10 MG tablet PLEASE SEE ATTACHED FOR DETAILED DIRECTIONS    ? hydrochlorothiazide (HYDRODIURIL) 25 MG tablet TAKE 1 TABLET BY MOUTH EVERY DAY 90 tablet 1  ? ibuprofen (ADVIL) 600 MG tablet TAKE ONE TABLET BY MOUTH TWO TIMES DAILY AS NEEDED, FOR BACK PAIN 30 tablet 0  ? KLOR-CON M20 20 MEQ tablet TAKE 1 TABLET BY MOUTH EVERY DAY 90 tablet 3  ? levothyroxine (SYNTHROID) 75 MCG tablet Take 1 tablet (75 mcg total) by mouth daily before breakfast. 90 tablet 3  ? metoprolol succinate (TOPROL-XL) 25 MG 24 hr tablet TAKE 1 TABLET BY MOUTH EVERY DAY 90 tablet 1  ? pravastatin (PRAVACHOL) 10 MG tablet TAKE 1 TABLET BY MOUTH EVERY DAY 90 tablet 3  ? ?No current facility-administered  medications on file prior to visit.  ? ? ?Allergies  ?Allergen Reactions  ? Iodine Itching  ? Ace Inhibitors Other (See Comments)  ?  Cough   ? Hydrochlorothiazide Cough  ? ? ?Objective: ?Physical Exam ? ?General: Well developed, nourished, no acute distress, awake, alert and oriented x 3 ? ?Vascular: Dorsalis pedis artery 1/4 bilateral, Posterior tibial artery 1/4 bilateral, +1 pitting edema bilateral, skin temperature warm to warm proximal to distal bilateral lower extremities, no varicosities, pedal hair present bilateral. ? ?Neurological: Gross sensation present via light touch bilateral.  ? ?Dermatological: Skin is warm, dry, and supple bilateral, Nails 1-10 are short thick, and discolored with mild subungal debris bilateral hallux and pigmented streaks L>R 1st toenails, no webspace macerations present bilateral, no open lesions present bilateral, no callus/corns/hyperkeratotic tissue present bilateral. No signs of infection bilateral. ? ?Musculoskeletal:   There is lesser hammertoe deformity and mild bunion deformity noted.  No pain with calf compression bilateral. ? ?Past Fungal culture-T rubrum and microtrauma ? ?Assessment and Plan:  ?Problem List Items Addressed This Visit   ?None ?Visit Diagnoses   ? ? Nail fungal infection    -  Primary  ? Lower extremity edema      ? ?  ? ? ?-Examined patient ?-Mechanically smoothed all nails using a Dremel without incident ?-Advised patient that we will continue to monitor her nails as they grow out; she has completed 6 laser  treatment ?-Advised patient to also consider formula 7 as well, purchase from the front desk, with daily filing ?-Advised good hygiene habits ?-Patient to return if fails to continue to improve after 6 to 9 months or sooner if symptoms worsen. ?-Advised patient to follow-up with her PCP about lower extremity swelling since he has not improved with her fluid pill medication. ? ?Landis Martins, DPM ? ?

## 2022-03-07 DIAGNOSIS — H5213 Myopia, bilateral: Secondary | ICD-10-CM | POA: Diagnosis not present

## 2022-03-07 DIAGNOSIS — H524 Presbyopia: Secondary | ICD-10-CM | POA: Diagnosis not present

## 2022-03-07 DIAGNOSIS — Z961 Presence of intraocular lens: Secondary | ICD-10-CM | POA: Diagnosis not present

## 2022-03-07 DIAGNOSIS — H401131 Primary open-angle glaucoma, bilateral, mild stage: Secondary | ICD-10-CM | POA: Diagnosis not present

## 2022-03-13 ENCOUNTER — Other Ambulatory Visit: Payer: Self-pay | Admitting: Family Medicine

## 2022-04-12 ENCOUNTER — Other Ambulatory Visit: Payer: Self-pay

## 2022-04-12 ENCOUNTER — Telehealth: Payer: Self-pay | Admitting: "Endocrinology

## 2022-04-12 DIAGNOSIS — E039 Hypothyroidism, unspecified: Secondary | ICD-10-CM

## 2022-05-29 DIAGNOSIS — E039 Hypothyroidism, unspecified: Secondary | ICD-10-CM | POA: Diagnosis not present

## 2022-05-30 LAB — TSH: TSH: 2.51 u[IU]/mL (ref 0.450–4.500)

## 2022-05-30 LAB — T4, FREE: Free T4: 1.53 ng/dL (ref 0.82–1.77)

## 2022-06-04 ENCOUNTER — Ambulatory Visit (INDEPENDENT_AMBULATORY_CARE_PROVIDER_SITE_OTHER): Payer: No Typology Code available for payment source | Admitting: "Endocrinology

## 2022-06-04 ENCOUNTER — Encounter: Payer: Self-pay | Admitting: "Endocrinology

## 2022-06-04 VITALS — BP 108/66 | HR 76 | Ht 68.0 in | Wt 221.6 lb

## 2022-06-04 DIAGNOSIS — E782 Mixed hyperlipidemia: Secondary | ICD-10-CM

## 2022-06-04 DIAGNOSIS — E042 Nontoxic multinodular goiter: Secondary | ICD-10-CM | POA: Diagnosis not present

## 2022-06-04 DIAGNOSIS — E039 Hypothyroidism, unspecified: Secondary | ICD-10-CM

## 2022-06-04 MED ORDER — PRAVASTATIN SODIUM 10 MG PO TABS: 10.0000 mg | ORAL_TABLET | Freq: Every day | ORAL | 1 refills | Status: DC

## 2022-06-04 NOTE — Progress Notes (Signed)
06/04/2022     Endocrinology follow-up note  Chief complaint: Follow-up for hypothyroidism   HPI  Tammy Galvan is a 88 y.o.-year-old female. -She is being seen in follow-up for management of hypothyroidism, and multinodular goiter.     She is currently on Synthroid 75 mcg p.o. daily before breakfast.  She reports compliance and consistency taking her medication.    She denies any new complaints today.  She has steady weight, no palpitations, tremors, heat/cold intolerance.  Previsit thyroid function tests are consistent with appropriate replacement.  -Her last thyroid ultrasound was consistent with slight shrinking of bilateral thyroid lobes as well as previously documented nodules.  Left thyroid lobe nodule fine-needle aspiration in November 2016 was negative for malignancy.  ROS: Limited as above.  PE: BP 108/66   Pulse 76   Ht '5\' 8"'$  (1.727 m)   Wt 221 lb 9.6 oz (100.5 kg)   BMI 33.69 kg/m  Wt Readings from Last 3 Encounters:  06/04/22 221 lb 9.6 oz (100.5 kg)  12/06/21 218 lb (98.9 kg)  12/05/21 218 lb (98.9 kg)     June 03, 2018 thyroid ultrasound IMPRESSION: 1. Normal-sized thyroid with stable solitary left nodule, previously biopsied. No indication for biopsy or dedicated imaging follow-up  August 23, 2015 fine-needle aspiration of left lobe nodule benign follicular nodule.   Recent Results (from the past 2160 hour(s))  TSH     Status: None   Collection Time: 05/29/22 11:04 AM  Result Value Ref Range   TSH 2.510 0.450 - 4.500 uIU/mL  T4, Free     Status: None   Collection Time: 05/29/22 11:04 AM  Result Value Ref Range   Free T4 1.53 0.82 - 1.77 ng/dL    ASSESSMENT: 1. MNG 2. hypothyroidism  PLAN:  -Her previsit thyroid function tests are done with appropriate replacement.  She is advised to continue Synthroid 75 mcg p.o. daily before breakfast.     - We discussed about the correct intake of her thyroid hormone, on empty stomach at fasting, with  water, separated by at least 30 minutes from breakfast and other medications,  and separated by more than 4 hours from calcium, iron, multivitamins, acid reflux medications (PPIs). -Patient is made aware of the fact that thyroid hormone replacement is needed for life, dose to be adjusted by periodic monitoring of thyroid function tests.   -She is status post fine needle aspiration which was reported to be benign follicular nodule in November 2016 -[name].  Her thyroid ultrasound from June 03, 2018 is consistent with shrinking of bilateral thyroid lobes and previously biopsied nodules.  She will have repeat surveillance thyroid ultrasound before her next visit in a year.   regarding her dyslipidemia, she is advised to resume and continue pravastatin 10 mg p.o. daily at bedtime.  Whole food plant-based diet is discussed with her.  She will need repeat fasting lipid panel before next visit.  I advised her to maintain close follow up with Dr. Tula Nakayama  for her primary care needs.    I spent 23 minutes in the care of the patient today including review of labs from Thyroid Function, CMP, and other relevant labs ; imaging/biopsy records (current and previous including abstractions from other facilities); face-to-face time discussing  her lab results and symptoms, medications doses, her options of short and long term treatment based on the latest standards of care / guidelines;   and documenting the encounter.  [name]  participated in  the discussions, expressed understanding, and voiced agreement with the above plans.  All questions were answered to her satisfaction. she is encouraged to contact clinic should she have any questions or concerns prior to her return visit.     Glade Lloyd, MD  Tel. [phone], Fax [phone]  -  This note was partially dictated with voice recognition software. Similar sounding words can be transcribed inadequately or may not  be corrected  upon review.

## 2022-06-05 ENCOUNTER — Ambulatory Visit: Payer: Medicare PPO | Admitting: "Endocrinology

## 2022-06-12 DIAGNOSIS — Z0100 Encounter for examination of eyes and vision without abnormal findings: Secondary | ICD-10-CM | POA: Diagnosis not present

## 2022-06-19 ENCOUNTER — Other Ambulatory Visit: Payer: Self-pay | Admitting: Family Medicine

## 2022-07-03 ENCOUNTER — Ambulatory Visit (HOSPITAL_COMMUNITY)
Admission: RE | Admit: 2022-07-03 | Discharge: 2022-07-03 | Disposition: A | Discharge status: Home / Self Care | Payer: No Typology Code available for payment source | Source: Ambulatory Visit | Attending: Family Medicine | Admitting: Family Medicine

## 2022-07-03 DIAGNOSIS — Z1231 Encounter for screening mammogram for malignant neoplasm of breast: Secondary | ICD-10-CM | POA: Insufficient documentation

## 2022-07-25 ENCOUNTER — Other Ambulatory Visit: Payer: Self-pay

## 2022-07-25 ENCOUNTER — Encounter: Payer: Self-pay | Admitting: Family Medicine

## 2022-07-25 ENCOUNTER — Ambulatory Visit (INDEPENDENT_AMBULATORY_CARE_PROVIDER_SITE_OTHER): Payer: No Typology Code available for payment source | Admitting: Family Medicine

## 2022-07-25 VITALS — BP 138/72 | HR 70 | Ht 68.0 in | Wt 221.0 lb

## 2022-07-25 DIAGNOSIS — R7303 Prediabetes: Secondary | ICD-10-CM | POA: Diagnosis not present

## 2022-07-25 DIAGNOSIS — R7302 Impaired glucose tolerance (oral): Secondary | ICD-10-CM

## 2022-07-25 DIAGNOSIS — I10 Essential (primary) hypertension: Secondary | ICD-10-CM | POA: Diagnosis not present

## 2022-07-25 DIAGNOSIS — Z0001 Encounter for general adult medical examination with abnormal findings: Secondary | ICD-10-CM

## 2022-07-25 DIAGNOSIS — E785 Hyperlipidemia, unspecified: Secondary | ICD-10-CM | POA: Diagnosis not present

## 2022-07-25 DIAGNOSIS — Z6832 Body mass index (BMI) 32.0-32.9, adult: Secondary | ICD-10-CM | POA: Diagnosis not present

## 2022-07-25 DIAGNOSIS — E039 Hypothyroidism, unspecified: Secondary | ICD-10-CM | POA: Diagnosis not present

## 2022-07-25 DIAGNOSIS — Z23 Encounter for immunization: Secondary | ICD-10-CM

## 2022-07-25 DIAGNOSIS — E669 Obesity, unspecified: Secondary | ICD-10-CM | POA: Diagnosis not present

## 2022-07-25 DIAGNOSIS — Z008 Encounter for other general examination: Secondary | ICD-10-CM | POA: Diagnosis not present

## 2022-07-25 DIAGNOSIS — Z0000 Encounter for general adult medical examination without abnormal findings: Secondary | ICD-10-CM

## 2022-07-25 MED ORDER — IBUPROFEN 600 MG PO TABS: ORAL_TABLET | ORAL | 0 refills | Status: DC

## 2022-07-25 MED ORDER — AMLODIPINE BESYLATE 10 MG PO TABS: 10.0000 mg | ORAL_TABLET | Freq: Every day | ORAL | 2 refills | Status: DC

## 2022-07-25 MED ORDER — POTASSIUM CHLORIDE CRYS ER 20 MEQ PO TBCR: 20.0000 meq | EXTENDED_RELEASE_TABLET | Freq: Every day | ORAL | 1 refills | Status: DC

## 2022-07-25 NOTE — Patient Instructions (Addendum)
F/u in 6 months, call if you need me sooner  Flu vaccine today  Right buttock and thigh pain is from arthritis and sciatic nerve pressure flaring up from time to time   Fasting lipid, cmp and EGFR , hBA1C in next 1 week  Work on weight loss by increasing green vegetables, and eating smaller portions  Nurse pls refill ibuprofen 600 mg x 1   It is important that you exercise regularly at least 30 minutes 5 times a week. If you develop chest pain, have severe difficulty breathing, or feel very tired, stop exercising immediately and seek medical attention  Thanks for choosing [facility], we consider it a privelige to serve you.

## 2022-07-25 NOTE — Progress Notes (Signed)
+     Tammy Galvan     MRN: [id number]      DOB: 1933-03-25  HPI: Patient is in for annual physical exam. C/o intermittent right low back , buttock and posterior thigh pain, has known arthritis and disc disease in lumbar spine. Recent labs,  are reviewed. Immunization is reviewed , and  updated if needed.   PE: BP 138/72   Pulse 70   Ht '5\' 8"'$  (1.727 m)   Wt 221 lb (100.2 kg)   SpO2 95%   BMI 33.60 kg/m   Pleasant  female, alert and oriented x 3, in no cardio-pulmonary distress. Afebrile. HEENT No facial trauma or asymetry. Sinuses non tender.  Extra occullar muscles intact.. External ears normal, . Neck: supple, no adenopathy,JVD or thyromegaly.No bruits.  Chest: Clear to ascultation bilaterally.No crackles or wheezes. Non tender to palpation  Cardiovascular system; Heart sounds normal,  S1 and  S2 ,no S3.  No murmur, or thrill. Apical beat not displaced Peripheral pulses normal.  Abdomen: Soft, non tender, no organomegaly or masses. No bruits. Bowel sounds normal. No guarding, tenderness or rebound.    Musculoskeletal exam: Decreased though adequate  ROM of spine, hips , shoulders and knees. No deformity ,swelling or crepitus noted. No muscle wasting or atrophy.   Neurologic: Cranial nerves 2 to 12 intact. Power, tone ,sensation  normal throughout. Minor  disturbance in gait. No tremor.  Skin: Intact, no ulceration, erythema , scaling or rash noted. Pigmentation normal throughout  Psych; Normal mood and affect. Judgement and concentration normal   Assessment & Plan:  Annual physical exam Annual exam as documented. Counseling done  re healthy lifestyle involving commitment to 150 minutes exercise per week, heart healthy diet, and attaining healthy weight.The importance of adequate sleep also discussed.  Changes in health habits are decided on by the patient with goals and time frames  set for achieving them. Immunization and cancer screening needs  are specifically addressed at this visit.

## 2022-07-26 ENCOUNTER — Encounter: Payer: Self-pay | Admitting: Family Medicine

## 2022-07-26 NOTE — Assessment & Plan Note (Signed)
Annual exam as documented. Counseling done  re healthy lifestyle involving commitment to 150 minutes exercise per week, heart healthy diet, and attaining healthy weight.The importance of adequate sleep also discussed. Changes in health habits are decided on by the patient with goals and time frames  set for achieving them. Immunization and cancer screening needs are specifically addressed at this visit. 

## 2022-08-05 DIAGNOSIS — H01001 Unspecified blepharitis right upper eyelid: Secondary | ICD-10-CM | POA: Diagnosis not present

## 2022-08-05 DIAGNOSIS — H401122 Primary open-angle glaucoma, left eye, moderate stage: Secondary | ICD-10-CM | POA: Diagnosis not present

## 2022-08-05 DIAGNOSIS — H401111 Primary open-angle glaucoma, right eye, mild stage: Secondary | ICD-10-CM | POA: Diagnosis not present

## 2022-08-05 DIAGNOSIS — H01002 Unspecified blepharitis right lower eyelid: Secondary | ICD-10-CM | POA: Diagnosis not present

## 2022-08-12 DIAGNOSIS — I10 Essential (primary) hypertension: Secondary | ICD-10-CM | POA: Diagnosis not present

## 2022-08-12 DIAGNOSIS — R7302 Impaired glucose tolerance (oral): Secondary | ICD-10-CM | POA: Diagnosis not present

## 2022-08-12 DIAGNOSIS — E785 Hyperlipidemia, unspecified: Secondary | ICD-10-CM | POA: Diagnosis not present

## 2022-08-13 ENCOUNTER — Encounter: Payer: Self-pay | Admitting: Family Medicine

## 2022-08-13 LAB — CMP14+EGFR
ALT: 14 IU/L (ref 0–32)
AST: 19 IU/L (ref 0–40)
Albumin/Globulin Ratio: 1.8 (ref 1.2–2.2)
Albumin: 4 g/dL (ref 3.7–4.7)
Alkaline Phosphatase: 79 IU/L (ref 44–121)
BUN/Creatinine Ratio: 17 (ref 12–28)
BUN: 16 mg/dL (ref 8–27)
Bilirubin Total: 0.4 mg/dL (ref 0.0–1.2)
CO2: 25 mmol/L (ref 20–29)
Calcium: 9.7 mg/dL (ref 8.7–10.3)
Chloride: 102 mmol/L (ref 96–106)
Creatinine, Ser: 0.95 mg/dL (ref 0.57–1.00)
Globulin, Total: 2.2 g/dL (ref 1.5–4.5)
Glucose: 88 mg/dL (ref 70–99)
Potassium: 4.1 mmol/L (ref 3.5–5.2)
Sodium: 139 mmol/L (ref 134–144)
Total Protein: 6.2 g/dL (ref 6.0–8.5)
eGFR: 58 mL/min/{1.73_m2} — ABNORMAL LOW (ref >59)

## 2022-08-13 LAB — LIPID PANEL
Chol/HDL Ratio: 2.9 ratio (ref 0.0–4.4)
Cholesterol, Total: 157 mg/dL (ref 100–199)
HDL: 54 mg/dL (ref >39)
LDL Chol Calc (NIH): 90 mg/dL (ref 0–99)
Triglycerides: 65 mg/dL (ref 0–149)
VLDL Cholesterol Cal: 13 mg/dL (ref 5–40)

## 2022-08-13 LAB — HEMOGLOBIN A1C
Est. average glucose Bld gHb Est-mCnc: 126 mg/dL
Hgb A1c MFr Bld: 6 % — ABNORMAL HIGH (ref 4.8–5.6)

## 2022-08-27 ENCOUNTER — Ambulatory Visit (INDEPENDENT_AMBULATORY_CARE_PROVIDER_SITE_OTHER): Payer: No Typology Code available for payment source | Admitting: Internal Medicine

## 2022-08-27 ENCOUNTER — Encounter: Payer: Self-pay | Admitting: Internal Medicine

## 2022-08-27 DIAGNOSIS — Z0000 Encounter for general adult medical examination without abnormal findings: Secondary | ICD-10-CM

## 2022-08-27 NOTE — Patient Instructions (Signed)
  Tammy Galvan , Thank you for taking time to come for your Medicare Wellness Visit. I appreciate your ongoing commitment to your health goals. Please review the following plan we discussed and let me know if I can assist you in the future.   These are the goals we discussed:  Goals      Develop a Weight Loss Readiness Plan     Pt wishes to lose weight.      Have 3 meals a day     Recommend eating 3 balanced meals a day and cutting back on sweets.     Patient Stated     Be more active.        This is a list of the screening recommended for you and due dates:  Health Maintenance  Topic Date Due   Tetanus Vaccine  06/17/2017   COVID-19 Vaccine (4 - Moderna series) 09/15/2020   Pneumonia Vaccine  Completed   Flu Shot  Completed   DEXA scan (bone density measurement)  Completed   Zoster (Shingles) Vaccine  Completed   HPV Vaccine  Aged Out

## 2022-08-27 NOTE — Progress Notes (Signed)
Subjective:  This is a telephone encounter between Tammy Galvan and Tammy Galvan on 08/27/2022 for AWV. The visit was conducted with the patient located at home and Tammy Galvan at [facility]. The patient's identity was confirmed using their DOB and current address. The patient has consented to being evaluated through a telephone encounter and understands the associated risks (an examination cannot be done and the patient may need to come in for an appointment) / benefits (allows the patient to remain at home, decreasing exposure to coronavirus).      Tammy Galvan is a 88 y.o. female who presents for Medicare Annual (Subsequent) preventive examination.  Review of Systems    Review of Systems  All other systems reviewed and are negative.   Objective:    Today's Vitals   08/27/22 0912  PainSc: 0-No pain   There is no height or weight on file to calculate BMI.     08/27/2022    9:14 AM 08/22/2021   10:29 AM 12/22/2020    3:42 PM 08/09/2020    8:33 AM 08/06/2019    8:39 AM 02/17/2017   10:09 AM 07/01/2012    7:46 AM  Advanced Directives  Does Patient Have a Medical Advance Directive? No No No No No No Patient does not have advance directive;Patient would not like information  Would patient like information on creating a medical advance directive? Yes (MAU/Ambulatory/Procedural Areas - Information given) Yes (MAU/Ambulatory/Procedural Areas - Information given) No - Patient declined No - Patient declined No - Patient declined Yes (MAU/Ambulatory/Procedural Areas - Information given)     Current Medications (verified) Outpatient Encounter Medications as of 08/27/2022  Medication Sig   amLODipine (NORVASC) 10 MG tablet Take 1 tablet (10 mg total) by mouth daily.   brimonidine-timolol (COMBIGAN) 0.2-0.5 % ophthalmic solution Place 1 drop into both eyes 2 (two) times daily.   hydrochlorothiazide (HYDRODIURIL) 25 MG tablet TAKE 1 TABLET BY MOUTH EVERY DAY   ibuprofen (ADVIL) 600 MG tablet TAKE  ONE TABLET BY MOUTH TWO TIMES DAILY AS NEEDED, FOR BACK PAIN   levothyroxine (SYNTHROID) 75 MCG tablet Take 1 tablet (75 mcg total) by mouth daily before breakfast.   metoprolol succinate (TOPROL-XL) 25 MG 24 hr tablet TAKE 1 TABLET BY MOUTH EVERY DAY   potassium chloride SA (KLOR-CON M) 20 MEQ tablet Take 1 tablet (20 mEq total) by mouth daily.   pravastatin (PRAVACHOL) 10 MG tablet Take 1 tablet (10 mg total) by mouth daily.   [DISCONTINUED] aspirin 325 MG tablet Take 325 mg by mouth daily.   [DISCONTINUED] diazepam (VALIUM) 10 MG tablet PLEASE SEE ATTACHED FOR DETAILED DIRECTIONS   No facility-administered encounter medications on file as of 08/27/2022.    Allergies (verified) Iodine and Ace inhibitors   History: Past Medical History:  Diagnosis Date   Glaucoma 2010   Hyperlipidemia    Hypertension    Obesity    Prediabetes 2013   Thyroid disease    TIA (transient ischemic attack)    Past Surgical History:  Procedure Laterality Date   ABDOMINAL HYSTERECTOMY  1986   CATARACT EXTRACTION     COLONOSCOPY  07/01/2012   Procedure: COLONOSCOPY;  Surgeon: Rogene Houston, MD;  Location: [facility];  Service: Endoscopy;  Laterality: N/A;  830   EYE SURGERY      bilateral cataract exrtaction   FRACTURE SURGERY     right ankle and left ankle    Family History  Problem Relation Age of Onset  Arthritis Mother    Diabetes Father    Heart disease Father    Stroke Sister    Diabetes Sister    Pulmonary embolism Sister    Kidney disease Brother        on dialysis   Kidney failure Brother    Social History   Socioeconomic History   Marital status: Widowed    Spouse name: Not on file   Number of children: Not on file   Years of education: Not on file   Highest education level: Not on file  Occupational History   Occupation: Retired   Tobacco Use   Smoking status: Never   Smokeless tobacco: Never  Vaping Use   Vaping Use: Never used  Substance and Sexual Activity    Alcohol use: No   Drug use: No   Sexual activity: Never    Birth control/protection: Surgical  Other Topics Concern   Not on file  Social History Narrative   Not on file   Social Determinants of Health   Financial Resource Strain: Low Risk  (08/22/2021)   Overall Financial Resource Strain (CARDIA)    Difficulty of Paying Living Expenses: Not hard at all  Food Insecurity: No Food Insecurity (08/22/2021)   Hunger Vital Sign    Worried About Running Out of Food in the Last Year: Never true    [place] in the Last Year: Never true  Transportation Needs: No Transportation Needs (08/22/2021)   PRAPARE - Hydrologist (Medical): No    Lack of Transportation (Non-Medical): No  Physical Activity: Insufficiently Active (08/22/2021)   Exercise Vital Sign    Days of Exercise per Week: 4 days    Minutes of Exercise per Session: 20 min  Stress: No Stress Concern Present (08/22/2021)   [place]    Feeling of Stress : Not at all  Social Connections: Moderately Isolated (08/22/2021)   Social Connection and Isolation Panel [NHANES]    Frequency of Communication with Friends and Family: More than three times a week    Frequency of Social Gatherings with Friends and Family: More than three times a week    Attends Religious Services: More than 4 times per year    Active Member of [employer] or Organizations: No    Attends Archivist Meetings: Never    Marital Status: Widowed    Tobacco Counseling Counseling given: Not Answered   Clinical Intake:  Pre-visit preparation completed: Yes  Pain : No/denies pain Pain Score: 0-No pain        How often do you need to have someone help you when you read instructions, pamphlets, or other written materials from your doctor or pharmacy?: 1 - Never What is the last grade level you completed in school?: College  Diabetic?No     Activities of  Daily Living    08/27/2022    9:18 AM  In your present state of health, do you have any difficulty performing the following activities:  Hearing? [place]? 0  Difficulty concentrating or making decisions? 0  Walking or climbing stairs? 0  Dressing or bathing? 0  Doing errands, shopping? 0    Patient Care Team: Fayrene Helper, MD as PCP - General  Indicate any recent Medical Services you may have received from other than [facility] providers in the past year (date may be approximate).     Assessment:  This is a routine wellness examination for Kedra.  Hearing/Vision screen No results found.  Dietary issues and exercise activities discussed:     Goals Addressed   None    Depression Screen    08/27/2022    9:17 AM 07/25/2022    1:12 PM 12/05/2021    8:30 AM 08/22/2021   10:30 AM 08/22/2021   10:26 AM 07/24/2021    2:01 PM 06/13/2021    9:11 AM  PHQ 2/9 Scores  PHQ - 2 Score 0 0 0 0 0 0 0  PHQ- 9 Score      1     Fall Risk    08/27/2022    9:17 AM 07/25/2022    1:12 PM 12/05/2021    8:30 AM 10/10/2021    4:02 PM 08/22/2021   10:29 AM  Fall Risk   Falls in the past year? 0 0 0 1 1  Number falls in past yr: 0 0 0 0 0  Injury with Fall? 0 0 0 0 1  Risk for fall due to :   No Fall Risks  History of fall(s)  Follow up  Falls evaluation completed Falls evaluation completed  Falls evaluation completed    [place]:  Any stairs in or around the home? Yes  If so, are there any without handrails? Yes  Home free of loose throw rugs in walkways, pet beds, electrical cords, etc? Yes  Adequate lighting in your home to reduce risk of falls? Yes   ASSISTIVE DEVICES UTILIZED TO PREVENT FALLS:  Life alert? Yes  Use of a cane, walker or w/c? No  Grab bars in the bathroom? Yes  Shower chair or bench in shower? Yes  Elevated toilet seat or a handicapped toilet? Yes    Cognitive Function:    08/22/2021   10:30 AM   MMSE - Mini Mental State Exam  Not completed: Unable to complete        08/22/2021   10:31 AM 08/09/2020    8:40 AM 08/06/2019    8:45 AM 07/27/2018   10:13 AM 02/17/2017   10:18 AM  6CIT Screen  What Year? 0 points 0 points 0 points 0 points 0 points  What month? 0 points 0 points 0 points 0 points 0 points  What time? 0 points 0 points 0 points 0 points 0 points  Count back from 20 0 points 0 points 0 points 0 points 0 points  Months in reverse 0 points 0 points 0 points 0 points 0 points  Repeat phrase 0 points 0 points 0 points 0 points 0 points  Total Score 0 points 0 points 0 points 0 points 0 points    Immunizations Immunization History  Administered Date(s) Administered   Fluad Quad(high Dose 65+) 07/21/2019, 06/27/2020, 07/24/2021, 07/25/2022   Influenza Split 08/30/2011, 07/08/2012   Influenza Whole 08/05/2007, 06/26/2010   Influenza, High Dose Seasonal PF 07/27/2018   Influenza,inj,Quad PF,6+ Mos 07/15/2013, 06/14/2014, 10/19/2015, 08/08/2016, 07/29/2017   Moderna Sars-Covid-2 Vaccination 12/16/2019, 01/14/2020, 07/21/2020   Pneumococcal Conjugate-13 06/15/2015   Pneumococcal Polysaccharide-23 04/03/2009   Td 06/18/2007   Zoster Recombinat (Shingrix) 06/04/2021, 09/29/2021    TDAP status: Due, Education has been provided regarding the importance of this vaccine. Advised may receive this vaccine at local pharmacy or Health Dept. Aware to provide a copy of the vaccination record if obtained from local pharmacy or Health Dept. Verbalized acceptance and understanding.  Flu Vaccine status: Up to date  Pneumococcal vaccine status: Up to date  Covid-19 vaccine status: Information provided on how to obtain vaccines.   Qualifies for Shingles Vaccine? Yes   Zostavax completed No   Shingrix Completed?: Yes  Screening Tests Health Maintenance  Topic Date Due   TETANUS/TDAP  06/17/2017   COVID-19 Vaccine (4 - Moderna series) 09/15/2020   Pneumonia Vaccine 65+ Years  old  Completed   INFLUENZA VACCINE  Completed   DEXA SCAN  Completed   Zoster Vaccines- Shingrix  Completed   HPV VACCINES  Aged Out    Health Maintenance  Health Maintenance Due  Topic Date Due   TETANUS/TDAP  06/17/2017   COVID-19 Vaccine (4 - Moderna series) 09/15/2020    Colorectal cancer screening: No longer required.   Mammogram status: Completed 07/03/2022. Repeat every year  Bone Density status: Completed 03/18/2014. Results reflect: Bone density results: NORMAL.   Lung Cancer Screening: (Low Dose CT Chest recommended if Age 43-80 years, 30 pack-year currently smoking OR have quit w/in 15years.) does not qualify.    Additional Screening:  Hepatitis C Screening: does not qualify  Vision Screening: Recommended annual ophthalmology exams for early detection of glaucoma and other disorders of the eye. Is the patient up to date with their annual eye exam?  Yes  Who is the provider or what is the name of the office in which the patient attends annual eye exams? [facility] If pt is not established with a provider, would they like to be referred to a provider to establish care? No .   Dental Screening: Recommended annual dental exams for proper oral hygiene  Community Resource Referral / Chronic Care Management: CRR required this visit?  No   CCM required this visit?  No      Plan:     I have personally reviewed and noted the following in the patient's chart:   Medical and social history Use of alcohol, tobacco or illicit drugs  Current medications and supplements including opioid prescriptions. Patient is not currently taking opioid prescriptions. Functional ability and status Nutritional status Physical activity Advanced directives List of other physicians Hospitalizations, surgeries, and ER visits in previous 12 months Vitals Screenings to include cognitive, depression, and falls Referrals and appointments  In addition, I have reviewed and discussed  with patient certain preventive protocols, quality metrics, and best practice recommendations. A written personalized care plan for preventive services as well as general preventive health recommendations were provided to patient.     Tammy Dy, MD   08/27/2022

## 2022-11-01 ENCOUNTER — Other Ambulatory Visit: Payer: Self-pay | Admitting: "Endocrinology

## 2022-11-20 ENCOUNTER — Other Ambulatory Visit: Payer: Self-pay | Admitting: Family Medicine

## 2022-11-20 ENCOUNTER — Other Ambulatory Visit: Payer: Self-pay | Admitting: "Endocrinology

## 2022-12-05 ENCOUNTER — Ambulatory Visit: Payer: No Typology Code available for payment source | Admitting: "Endocrinology

## 2022-12-09 DIAGNOSIS — H26493 Other secondary cataract, bilateral: Secondary | ICD-10-CM | POA: Diagnosis not present

## 2022-12-09 DIAGNOSIS — H401111 Primary open-angle glaucoma, right eye, mild stage: Secondary | ICD-10-CM | POA: Diagnosis not present

## 2022-12-09 DIAGNOSIS — H401122 Primary open-angle glaucoma, left eye, moderate stage: Secondary | ICD-10-CM | POA: Diagnosis not present

## 2022-12-09 DIAGNOSIS — H01001 Unspecified blepharitis right upper eyelid: Secondary | ICD-10-CM | POA: Diagnosis not present

## 2022-12-18 ENCOUNTER — Other Ambulatory Visit: Payer: Self-pay

## 2022-12-18 DIAGNOSIS — E039 Hypothyroidism, unspecified: Secondary | ICD-10-CM

## 2022-12-18 DIAGNOSIS — E782 Mixed hyperlipidemia: Secondary | ICD-10-CM

## 2022-12-19 ENCOUNTER — Ambulatory Visit: Payer: No Typology Code available for payment source | Admitting: "Endocrinology

## 2022-12-20 DIAGNOSIS — E782 Mixed hyperlipidemia: Secondary | ICD-10-CM | POA: Diagnosis not present

## 2022-12-20 DIAGNOSIS — E039 Hypothyroidism, unspecified: Secondary | ICD-10-CM | POA: Diagnosis not present

## 2022-12-21 LAB — LIPID PANEL
Chol/HDL Ratio: 3.1 ratio (ref 0.0–4.4)
Cholesterol, Total: 171 mg/dL (ref 100–199)
HDL: 56 mg/dL (ref >39)
LDL Chol Calc (NIH): 103 mg/dL — ABNORMAL HIGH (ref 0–99)
Triglycerides: 60 mg/dL (ref 0–149)
VLDL Cholesterol Cal: 12 mg/dL (ref 5–40)

## 2022-12-21 LAB — TSH: TSH: 2.16 u[IU]/mL (ref 0.450–4.500)

## 2022-12-21 LAB — T4, FREE: Free T4: 1.66 ng/dL (ref 0.82–1.77)

## 2022-12-25 ENCOUNTER — Ambulatory Visit (INDEPENDENT_AMBULATORY_CARE_PROVIDER_SITE_OTHER): Payer: No Typology Code available for payment source | Admitting: "Endocrinology

## 2022-12-25 ENCOUNTER — Encounter: Payer: Self-pay | Admitting: "Endocrinology

## 2022-12-25 VITALS — BP 116/64 | HR 60 | Ht 68.0 in | Wt 218.8 lb

## 2022-12-25 DIAGNOSIS — E039 Hypothyroidism, unspecified: Secondary | ICD-10-CM

## 2022-12-25 DIAGNOSIS — E042 Nontoxic multinodular goiter: Secondary | ICD-10-CM | POA: Diagnosis not present

## 2022-12-25 DIAGNOSIS — R7303 Prediabetes: Secondary | ICD-10-CM | POA: Diagnosis not present

## 2022-12-25 DIAGNOSIS — E782 Mixed hyperlipidemia: Secondary | ICD-10-CM | POA: Diagnosis not present

## 2022-12-25 NOTE — Progress Notes (Signed)
12/25/2022     Endocrinology follow-up note  Chief complaint: Follow-up for hypothyroidism   HPI  Tammy Galvan is a 89 y.o.-year-old female. -She is being seen in follow-up for management of hypothyroidism, and multinodular goiter.     She is currently on levothyroxine 75 mcg p.o. daily before breakfast.   She reports compliance and consistency taking her medication.    She denies any new complaints today.  She has steady weight, no palpitations, tremors, heat/cold intolerance.  Previsit thyroid function tests are consistent with appropriate replacement.  -Her last thyroid ultrasound was consistent with slight shrinking of bilateral thyroid lobes as well as previously documented nodules.  Left thyroid lobe nodule fine-needle aspiration in November 2016 was negative for malignancy.  ROS: Limited as above.  PE: BP 116/64   Pulse 60   Ht '5\' 8"'$  (1.727 m)   Wt 218 lb 12.8 oz (99.2 kg)   BMI 33.27 kg/m  Wt Readings from Last 3 Encounters:  12/25/22 218 lb 12.8 oz (99.2 kg)  07/25/22 221 lb (100.2 kg)  06/04/22 221 lb 9.6 oz (100.5 kg)     June 03, 2018 thyroid ultrasound IMPRESSION: 1. Normal-sized thyroid with stable solitary left nodule, previously biopsied. No indication for biopsy or dedicated imaging follow-up  August 23, 2015 fine-needle aspiration of left lobe nodule benign follicular nodule.   Recent Results (from the past 2160 hour(s))  Lipid Panel     Status: Abnormal   Collection Time: 12/20/22  9:57 AM  Result Value Ref Range   Cholesterol, Total 171 100 - 199 mg/dL   Triglycerides 60 0 - 149 mg/dL   HDL 56 >39 mg/dL   VLDL Cholesterol Cal 12 5 - 40 mg/dL   LDL Chol Calc (NIH) 103 (H) 0 - 99 mg/dL   Chol/HDL Ratio 3.1 0.0 - 4.4 ratio    Comment:                                   T. Chol/HDL Ratio                                             Men  Women                               1/2 Avg.Risk  3.4    3.3                                   Avg.Risk  5.0     4.4                                2X Avg.Risk  9.6    7.1                                3X Avg.Risk 23.4   11.0   TSH     Status: None   Collection Time: 12/20/22  9:57 AM  Result Value Ref Range   TSH 2.160 0.450 - 4.500 uIU/mL  T4, Free     Status: None  Collection Time: 12/20/22  9:57 AM  Result Value Ref Range   Free T4 1.66 0.82 - 1.77 ng/dL    ASSESSMENT: 1. MNG 2. hypothyroidism  PLAN:  -Her previsit thyroid function tests are consistent with appropriate replacement.  She is advised to continue Synthroid 75 mcg p.o. daily before breakfast.   - We discussed about the correct intake of her thyroid hormone, on empty stomach at fasting, with water, separated by at least 30 minutes from breakfast and other medications,  and separated by more than 4 hours from calcium, iron, multivitamins, acid reflux medications (PPIs). -Patient is made aware of the fact that thyroid hormone replacement is needed for life, dose to be adjusted by periodic monitoring of thyroid function tests.  -She is status post fine needle aspiration which was reported to be benign follicular nodule in November 2016 -[name].  Her thyroid ultrasound from June 03, 2018 is consistent with shrinking of bilateral thyroid lobes and previously biopsied nodules.  She will have repeat surveillance thyroid ultrasound before her next visit in a year.   regarding her dyslipidemia, she is advised to resume and continue pravastatin 10 mg p.o. daily at bedtime.  Whole food plant-based diet is discussed with her.  She will need repeat fasting lipid panel before next visit.  I advised her to maintain close follow up with Dr. Tula Galvan  for her primary care needs.  I spent  21  minutes in the care of the patient today including review of labs from Thyroid Function, CMP, and other relevant labs ; imaging/biopsy records (current and previous including abstractions from other facilities); face-to-face time  discussing  her lab results and symptoms, medications doses, her options of short and long term treatment based on the latest standards of care / guidelines;   and documenting the encounter.  Tammy Galvan  participated in the discussions, expressed understanding, and voiced agreement with the above plans.  All questions were answered to her satisfaction. she is encouraged to contact clinic should she have any questions or concerns prior to her return visit.   Tammy Lloyd, MD  Tel. [phone], Fax [phone]  -  This note was partially dictated with voice recognition software. Similar sounding words can be transcribed inadequately or may not  be corrected upon review.

## 2023-01-24 ENCOUNTER — Ambulatory Visit (INDEPENDENT_AMBULATORY_CARE_PROVIDER_SITE_OTHER): Payer: No Typology Code available for payment source | Admitting: Family Medicine

## 2023-01-24 ENCOUNTER — Encounter: Payer: Self-pay | Admitting: Family Medicine

## 2023-01-24 VITALS — BP 133/72 | HR 66 | Ht 68.0 in | Wt 215.0 lb

## 2023-01-24 DIAGNOSIS — E559 Vitamin D deficiency, unspecified: Secondary | ICD-10-CM | POA: Diagnosis not present

## 2023-01-24 DIAGNOSIS — Z1231 Encounter for screening mammogram for malignant neoplasm of breast: Secondary | ICD-10-CM | POA: Diagnosis not present

## 2023-01-24 DIAGNOSIS — I10 Essential (primary) hypertension: Secondary | ICD-10-CM

## 2023-01-24 DIAGNOSIS — R7302 Impaired glucose tolerance (oral): Secondary | ICD-10-CM

## 2023-01-24 DIAGNOSIS — M170 Bilateral primary osteoarthritis of knee: Secondary | ICD-10-CM | POA: Diagnosis not present

## 2023-01-24 DIAGNOSIS — E039 Hypothyroidism, unspecified: Secondary | ICD-10-CM

## 2023-01-24 DIAGNOSIS — E785 Hyperlipidemia, unspecified: Secondary | ICD-10-CM

## 2023-01-24 NOTE — Assessment & Plan Note (Signed)
  Patient re-educated about  the importance of commitment to a  minimum of 150 minutes of exercise per week as able.  The importance of healthy food choices with portion control discussed, as well as eating regularly and within a 12 hour window most days. The need to choose "clean , green" food 50 to 75% of the time is discussed, as well as to make water the primary drink and set a goal of 64 ounces water daily.       01/24/2023    9:22 AM 12/25/2022   10:58 AM 07/25/2022    1:10 PM  Weight /BMI  Weight 215 lb 0.6 oz 218 lb 12.8 oz 221 lb  Height 5\' 8"  (1.727 m) 5\' 8"  (1.727 m) 5\' 8"  (1.727 m)  BMI 32.7 kg/m2 33.27 kg/m2 33.6 kg/m2

## 2023-01-24 NOTE — Assessment & Plan Note (Signed)
Controlled and managed by Endo 

## 2023-01-24 NOTE — Assessment & Plan Note (Signed)
Hyperlipidemia:Low fat diet discussed and encouraged.   Lipid Panel  Lab Results  Component Value Date   CHOL 171 12/20/2022   HDL 56 12/20/2022   LDLCALC 103 (H) 12/20/2022   TRIG 60 12/20/2022   CHOLHDL 3.1 12/20/2022     Updated lab needed at/ before next visit.

## 2023-01-24 NOTE — Patient Instructions (Addendum)
Annual exam Oct 6 or after for Annual exam EKG at visit , call if you need me sooner  Labs today CBC, lipid, cmp and EGFr, HBA1C and vit D  Please schedule mammogram at checkout   Continue to reduce sugar and white starchy foods, keep active and lose weight to reduce the chance of becoming diabetic  Fasting lipid, cmp and EGFr and HBa1C last week in September  Mild arthritis in fingers comes along with the birthdays, you can take one tylenol or use topical arthritis cream that you already have  Thanks for choosing [facility], we consider it a privelige to serve you.

## 2023-01-24 NOTE — Assessment & Plan Note (Signed)
Reduced pain and improved function with weight loss, fall risk reduction and home safety reviewed briefly

## 2023-01-24 NOTE — Assessment & Plan Note (Signed)
Controlled, no change in medication DASH diet and commitment to daily physical activity for a minimum of 30 minutes discussed and encouraged, as a part of hypertension management. The importance of attaining a healthy weight is also discussed.     01/24/2023    9:22 AM 12/25/2022   10:58 AM 07/25/2022    1:54 PM 07/25/2022    1:32 PM 07/25/2022    1:13 PM 07/25/2022    1:10 PM 06/04/2022   10:37 AM  BP/Weight  Systolic BP 133 116 138 140 148 143 108  Diastolic BP 72 64 72 72 82 80 66  Wt. (Lbs) 215.04 218.8    221 221.6  BMI 32.7 kg/m2 33.27 kg/m2    33.6 kg/m2 33.69 kg/m2

## 2023-01-24 NOTE — Assessment & Plan Note (Signed)
Updated lab needed at/ before next visit. Patient educated about the importance of limiting  Carbohydrate intake , the need to commit to daily physical activity for a minimum of 30 minutes , and to commit weight loss. The fact that changes in all these areas will reduce or eliminate all together the development of diabetes is stressed.      Latest Ref Rng & Units 12/20/2022    9:57 AM 08/12/2022    9:51 AM 12/05/2021    9:07 AM 06/13/2021   10:16 AM 01/10/2021   11:47 AM  Diabetic Labs  HbA1c 4.8 - 5.6 %  6.0  6.0  6.0    Chol 100 - 199 mg/dL 381  017  510  258  527   HDL >39 mg/dL 56  54  62  50  53   Calc LDL 0 - 99 mg/dL 782  90  423  536  144   Triglycerides 0 - 149 mg/dL 60  65  60  68  79   Creatinine 0.57 - 1.00 mg/dL  3.15  4.00  8.67  6.19       01/24/2023    9:22 AM 12/25/2022   10:58 AM 07/25/2022    1:54 PM 07/25/2022    1:32 PM 07/25/2022    1:13 PM 07/25/2022    1:10 PM 06/04/2022   10:37 AM  BP/Weight  Systolic BP 133 116 138 140 148 143 108  Diastolic BP 72 64 72 72 82 80 66  Wt. (Lbs) 215.04 218.8    221 221.6  BMI 32.7 kg/m2 33.27 kg/m2    33.6 kg/m2 33.69 kg/m2       No data to display

## 2023-01-24 NOTE — Progress Notes (Signed)
Tammy Galvan     MRN: [id number]      DOB: 17-Jul-1933   HPI Tammy Galvan is here for follow up and re-evaluation of chronic medical conditions, medication management and review of any available recent lab and radiology data.  Preventive health is updated, specifically  Cancer screening and Immunization.   Questions or concerns regarding consultations or procedures which the PT has had in the interim are  addressed. The PT denies any adverse reactions to current medications since the last visit.  There are no new concerns.  C/o deformity of index finger mild intermittent pain.ROS Denies recent fever or chills. Denies sinus pressure, nasal congestion, ear pain or sore throat. Denies chest congestion, productive cough or wheezing. Denies chest pains, palpitations and leg swelling Denies abdominal pain, nausea, vomiting,diarrhea or constipation.   Denies dysuria, frequency, hesitancy or incontinence. Denies uncontrolled joint pain, swelling and limitation in mobility. Denies headaches, seizures, numbness, or tingling. Denies depression, anxiety or insomnia. Denies skin break down or rash.   PE  BP 133/72 (BP Location: Right Arm, Patient Position: Sitting, Cuff Size: Large)   Pulse 66   Ht 5\' 8"  (1.727 m)   Wt 215 lb 0.6 oz (97.5 kg)   SpO2 94%   BMI 32.70 kg/m   Patient alert and oriented and in no cardiopulmonary distress.  HEENT: No facial asymmetry, EOMI,     Neck supple .  Chest: Clear to auscultation bilaterally.  CVS: S1, S2 no murmurs, no S3.Regular rate.  ABD: Soft non tender.   Ext: No edema  MS: Adequate  though reduced ROM spine, shoulders, hips and knees.  Skin: Intact, no ulcerations or rash noted.  Psych: Good eye contact, normal affect. Memory intact not anxious or depressed appearing.  CNS: CN 2-12 intact, power,  normal throughout.no focal deficits noted.   Assessment & Plan  Essential hypertension Controlled, no change in medication DASH diet  and commitment to daily physical activity for a minimum of 30 minutes discussed and encouraged, as a part of hypertension management. The importance of attaining a healthy weight is also discussed.     01/24/2023    9:22 AM 12/25/2022   10:58 AM 07/25/2022    1:54 PM 07/25/2022    1:32 PM 07/25/2022    1:13 PM 07/25/2022    1:10 PM 06/04/2022   10:37 AM  BP/Weight  Systolic BP 133 116 138 140 148 143 108  Diastolic BP 72 64 72 72 82 80 66  Wt. (Lbs) 215.04 218.8    221 221.6  BMI 32.7 kg/m2 33.27 kg/m2    33.6 kg/m2 33.69 kg/m2       Hypothyroidism Controlled and managed by Endo  Hyperlipidemia LDL goal <100 Hyperlipidemia:Low fat diet discussed and encouraged.   Lipid Panel  Lab Results  Component Value Date   CHOL 171 12/20/2022   HDL 56 12/20/2022   LDLCALC 103 (H) 12/20/2022   TRIG 60 12/20/2022   CHOLHDL 3.1 12/20/2022     Updated lab needed at/ before next visit.   Morbid obesity (HCC)  Patient re-educated about  the importance of commitment to a  minimum of 150 minutes of exercise per week as able.  The importance of healthy food choices with portion control discussed, as well as eating regularly and within a 12 hour window most days. The need to choose "clean , green" food 50 to 75% of the time is discussed, as well as to make water the primary drink and set a  goal of 64 ounces water daily.       01/24/2023    9:22 AM 12/25/2022   10:58 AM 07/25/2022    1:10 PM  Weight /BMI  Weight 215 lb 0.6 oz 218 lb 12.8 oz 221 lb  Height 5\' 8"  (1.727 m) 5\' 8"  (1.727 m) 5\' 8"  (1.727 m)  BMI 32.7 kg/m2 33.27 kg/m2 33.6 kg/m2      Vitamin D deficiency Updated lab needed at/ before next visit.   IGT (impaired glucose tolerance) Updated lab needed at/ before next visit. Patient educated about the importance of limiting  Carbohydrate intake , the need to commit to daily physical activity for a minimum of 30 minutes , and to commit weight loss. The fact that changes in  all these areas will reduce or eliminate all together the development of diabetes is stressed.      Latest Ref Rng & Units 12/20/2022    9:57 AM 08/12/2022    9:51 AM 12/05/2021    9:07 AM 06/13/2021   10:16 AM 01/10/2021   11:47 AM  Diabetic Labs  HbA1c 4.8 - 5.6 %  6.0  6.0  6.0    Chol 100 - 199 mg/dL 051  102  111  735  670   HDL >39 mg/dL 56  54  62  50  53   Calc LDL 0 - 99 mg/dL 141  90  030  131  438   Triglycerides 0 - 149 mg/dL 60  65  60  68  79   Creatinine 0.57 - 1.00 mg/dL  8.87  5.79  7.28  2.06       01/24/2023    9:22 AM 12/25/2022   10:58 AM 07/25/2022    1:54 PM 07/25/2022    1:32 PM 07/25/2022    1:13 PM 07/25/2022    1:10 PM 06/04/2022   10:37 AM  BP/Weight  Systolic BP 133 116 138 140 148 143 108  Diastolic BP 72 64 72 72 82 80 66  Wt. (Lbs) 215.04 218.8    221 221.6  BMI 32.7 kg/m2 33.27 kg/m2    33.6 kg/m2 33.69 kg/m2       No data to display            Osteoarthritis of both knees Reduced pain and improved function with weight loss, fall risk reduction and home safety reviewed briefly

## 2023-01-24 NOTE — Assessment & Plan Note (Signed)
Updated lab needed at/ before next visit.   

## 2023-01-25 LAB — CMP14+EGFR
ALT: 13 IU/L (ref 0–32)
AST: 21 IU/L (ref 0–40)
Albumin/Globulin Ratio: 1.5 (ref 1.2–2.2)
Albumin: 3.8 g/dL (ref 3.7–4.7)
Alkaline Phosphatase: 87 IU/L (ref 44–121)
BUN/Creatinine Ratio: 16 (ref 12–28)
BUN: 13 mg/dL (ref 8–27)
Bilirubin Total: 0.5 mg/dL (ref 0.0–1.2)
CO2: 22 mmol/L (ref 20–29)
Calcium: 9.4 mg/dL (ref 8.7–10.3)
Chloride: 103 mmol/L (ref 96–106)
Creatinine, Ser: 0.8 mg/dL (ref 0.57–1.00)
Globulin, Total: 2.6 g/dL (ref 1.5–4.5)
Glucose: 88 mg/dL (ref 70–99)
Potassium: 4.1 mmol/L (ref 3.5–5.2)
Sodium: 141 mmol/L (ref 134–144)
Total Protein: 6.4 g/dL (ref 6.0–8.5)
eGFR: 71 mL/min/{1.73_m2} (ref >59)

## 2023-01-25 LAB — CBC
Hematocrit: 44 % (ref 34.0–46.6)
Hemoglobin: 14.1 g/dL (ref 11.1–15.9)
MCH: 26.1 pg — ABNORMAL LOW (ref 26.6–33.0)
MCHC: 32 g/dL (ref 31.5–35.7)
MCV: 81 fL (ref 79–97)
Platelets: 324 10*3/uL (ref 150–450)
RBC: 5.41 x10E6/uL — ABNORMAL HIGH (ref 3.77–5.28)
RDW: 14.2 % (ref 11.7–15.4)
WBC: 6.8 10*3/uL (ref 3.4–10.8)

## 2023-01-25 LAB — LIPID PANEL
Chol/HDL Ratio: 3.5 ratio (ref 0.0–4.4)
Cholesterol, Total: 186 mg/dL (ref 100–199)
HDL: 53 mg/dL (ref >39)
LDL Chol Calc (NIH): 118 mg/dL — ABNORMAL HIGH (ref 0–99)
Triglycerides: 83 mg/dL (ref 0–149)
VLDL Cholesterol Cal: 15 mg/dL (ref 5–40)

## 2023-01-25 LAB — HEMOGLOBIN A1C
Est. average glucose Bld gHb Est-mCnc: 126 mg/dL
Hgb A1c MFr Bld: 6 % — ABNORMAL HIGH (ref 4.8–5.6)

## 2023-01-25 LAB — VITAMIN D 25 HYDROXY (VIT D DEFICIENCY, FRACTURES): Vit D, 25-Hydroxy: 26.8 ng/mL — ABNORMAL LOW (ref 30.0–100.0)

## 2023-01-28 ENCOUNTER — Encounter: Payer: Self-pay | Admitting: Family Medicine

## 2023-02-13 ENCOUNTER — Telehealth: Payer: Self-pay | Admitting: Family Medicine

## 2023-02-13 NOTE — Telephone Encounter (Signed)
Tobi Bastos called from [facility] if patient still active taking medication. Call back #  [phone]

## 2023-02-14 ENCOUNTER — Telehealth: Payer: Self-pay | Admitting: Family Medicine

## 2023-02-14 NOTE — Telephone Encounter (Signed)
See other tele msg  

## 2023-02-14 NOTE — Telephone Encounter (Signed)
Done

## 2023-02-14 NOTE — Telephone Encounter (Signed)
[facility] called [phone]  Confirm  pravastatin (PRAVACHOL) 10 MG tablet [[id number]]  If still active and patient to take it.  Call back please

## 2023-02-18 ENCOUNTER — Other Ambulatory Visit: Payer: Self-pay | Admitting: "Endocrinology

## 2023-04-16 ENCOUNTER — Other Ambulatory Visit: Payer: Self-pay | Admitting: Family Medicine

## 2023-05-12 ENCOUNTER — Other Ambulatory Visit: Payer: Self-pay

## 2023-05-12 MED ORDER — PRAVASTATIN SODIUM 10 MG PO TABS: 10.0000 mg | ORAL_TABLET | Freq: Every day | ORAL | 0 refills | Status: DC

## 2023-05-13 ENCOUNTER — Other Ambulatory Visit: Payer: Self-pay

## 2023-05-13 MED ORDER — PRAVASTATIN SODIUM 10 MG PO TABS: 10.0000 mg | ORAL_TABLET | Freq: Every day | ORAL | 0 refills | Status: DC

## 2023-05-16 ENCOUNTER — Other Ambulatory Visit: Payer: Self-pay | Admitting: Family Medicine

## 2023-05-18 ENCOUNTER — Other Ambulatory Visit: Payer: Self-pay | Admitting: Family Medicine

## 2023-06-19 DIAGNOSIS — H401111 Primary open-angle glaucoma, right eye, mild stage: Secondary | ICD-10-CM | POA: Diagnosis not present

## 2023-06-19 DIAGNOSIS — H26493 Other secondary cataract, bilateral: Secondary | ICD-10-CM | POA: Diagnosis not present

## 2023-06-19 DIAGNOSIS — H401122 Primary open-angle glaucoma, left eye, moderate stage: Secondary | ICD-10-CM | POA: Diagnosis not present

## 2023-06-19 DIAGNOSIS — H01001 Unspecified blepharitis right upper eyelid: Secondary | ICD-10-CM | POA: Diagnosis not present

## 2023-06-25 DIAGNOSIS — E782 Mixed hyperlipidemia: Secondary | ICD-10-CM | POA: Diagnosis not present

## 2023-06-25 DIAGNOSIS — E039 Hypothyroidism, unspecified: Secondary | ICD-10-CM | POA: Diagnosis not present

## 2023-06-30 ENCOUNTER — Ambulatory Visit (INDEPENDENT_AMBULATORY_CARE_PROVIDER_SITE_OTHER): Payer: No Typology Code available for payment source | Admitting: "Endocrinology

## 2023-06-30 ENCOUNTER — Encounter: Payer: Self-pay | Admitting: "Endocrinology

## 2023-06-30 VITALS — BP 122/64 | HR 60 | Ht 68.0 in | Wt 209.6 lb

## 2023-06-30 DIAGNOSIS — R7303 Prediabetes: Secondary | ICD-10-CM

## 2023-06-30 DIAGNOSIS — E782 Mixed hyperlipidemia: Secondary | ICD-10-CM

## 2023-06-30 DIAGNOSIS — E039 Hypothyroidism, unspecified: Secondary | ICD-10-CM

## 2023-06-30 LAB — POCT GLYCOSYLATED HEMOGLOBIN (HGB A1C): HbA1c, POC (controlled diabetic range): 5.5 % (ref 0.0–7.0)

## 2023-06-30 MED ORDER — LEVOTHYROXINE SODIUM 75 MCG PO TABS: 75.0000 ug | ORAL_TABLET | Freq: Every day | ORAL | 3 refills | Status: DC

## 2023-06-30 NOTE — Patient Instructions (Signed)

## 2023-06-30 NOTE — Progress Notes (Signed)
06/30/2023     Endocrinology follow-up note  Chief complaint: Follow-up for hypothyroidism   HPI  Tammy Galvan is a 89 y.o.-year-old female. -She is being seen in follow-up for management of hypothyroidism, and multinodular goiter.     She is currently on levothyroxine 75 mcg p.o. daily before breakfast.     She reports compliance and consistency taking her medication.    She denies any new complaints today.  She has steady weight, no palpitations, tremors, heat/cold intolerance.  Previsit thyroid function tests are consistent with appropriate replacement.  -Her last thyroid ultrasound was consistent with slight shrinking of bilateral thyroid lobes as well as previously documented nodules.  Left thyroid lobe nodule fine-needle aspiration in November 2016 was negative for malignancy. She presents with significant weight loss, improving her A1c, as well as her lipid panel.  See below. ROS: Limited as above.  PE: BP 122/64   Pulse 60   Ht 5\' 8"  (1.727 m)   Wt 209 lb 9.6 oz (95.1 kg)   BMI 31.87 kg/m  Wt Readings from Last 3 Encounters:  06/30/23 209 lb 9.6 oz (95.1 kg)  01/24/23 215 lb 0.6 oz (97.5 kg)  12/25/22 218 lb 12.8 oz (99.2 kg)     June 03, 2018 thyroid ultrasound IMPRESSION: 1. Normal-sized thyroid with stable solitary left nodule, previously biopsied. No indication for biopsy or dedicated imaging follow-up  August 23, 2015 fine-needle aspiration of left lobe nodule benign follicular nodule.   Recent Results (from the past 2160 hour(s))  TSH     Status: None   Collection Time: 06/25/23 10:56 AM  Result Value Ref Range   TSH 1.830 0.450 - 4.500 uIU/mL  T4, free     Status: None   Collection Time: 06/25/23 10:56 AM  Result Value Ref Range   Free T4 1.57 0.82 - 1.77 ng/dL  Lipid panel     Status: None   Collection Time: 06/25/23 10:56 AM  Result Value Ref Range   Cholesterol, Total 162 100 - 199 mg/dL   Triglycerides 61 0 - 149 mg/dL   HDL 59 >16 mg/dL    VLDL Cholesterol Cal 12 5 - 40 mg/dL   LDL Chol Calc (NIH) 91 0 - 99 mg/dL   Chol/HDL Ratio 2.7 0.0 - 4.4 ratio    Comment:                                   T. Chol/HDL Ratio                                             Men  Women                               1/2 Avg.Risk  3.4    3.3                                   Avg.Risk  5.0    4.4                                2X Avg.Risk  9.6  7.1                                3X Avg.Risk 23.4   11.0   HgB A1c     Status: None   Collection Time: 06/30/23 10:18 AM  Result Value Ref Range   Hemoglobin A1C     HbA1c POC (<> result, manual entry)     HbA1c, POC (prediabetic range)     HbA1c, POC (controlled diabetic range) 5.5 0.0 - 7.0 %    ASSESSMENT: 1. MNG 2. hypothyroidism  PLAN:  -Her previsit thyroid function tests are consistent with appropriate replacement.  She is advised to continue Synthroid 75 mcg p.o. daily before breakfast.     - We discussed about the correct intake of her thyroid hormone, on empty stomach at fasting, with water, separated by at least 30 minutes from breakfast and other medications,  and separated by more than 4 hours from calcium, iron, multivitamins, acid reflux medications (PPIs). -Patient is made aware of the fact that thyroid hormone replacement is needed for life, dose to be adjusted by periodic monitoring of thyroid function tests.  -She is status post fine needle aspiration which was reported to be benign follicular nodule in November 2016 -[name].  Her thyroid ultrasound from June 03, 2018 is consistent with shrinking of bilateral thyroid lobes and previously biopsied nodules.  She will have repeat surveillance thyroid ultrasound before her next visit in a year.    -Regarding her hyperlipidemia, she returns with significant improvement in her LDL to 91 from 110 with engagement in the whole food plant-based lifestyle nutrition which is also allowing her to improve her A1c to 5.7%.   She is encouraged to stay on lifestyle medicine nutrition.  She is also advised to stay on her pravastatin 10 mg daily daily at bedtime.    I advised her to maintain close follow up with Dr. Syliva Overman  for her primary care needs.   I spent  26 minutes in the care of the patient today including review of labs from Thyroid Function, CMP, and other relevant labs ; imaging/biopsy records (current and previous including abstractions from other facilities); face-to-face time discussing  her lab results and symptoms, medications doses, her options of short and long term treatment based on the latest standards of care / guidelines;   and documenting the encounter.  Louanna Raw  participated in the discussions, expressed understanding, and voiced agreement with the above plans.  All questions were answered to her satisfaction. she is encouraged to contact clinic should she have any questions or concerns prior to her return visit.   Marquis Lunch, MD  Tel. [phone], Fax [phone]  -  This note was partially dictated with voice recognition software. Similar sounding words can be transcribed inadequately or may not  be corrected upon review.

## 2023-07-07 ENCOUNTER — Encounter (HOSPITAL_COMMUNITY): Payer: Self-pay

## 2023-07-07 ENCOUNTER — Ambulatory Visit (HOSPITAL_COMMUNITY)
Admission: RE | Admit: 2023-07-07 | Discharge: 2023-07-07 | Disposition: A | Discharge status: Home / Self Care | Payer: No Typology Code available for payment source | Source: Ambulatory Visit | Attending: Family Medicine | Admitting: Family Medicine

## 2023-07-07 DIAGNOSIS — Z1231 Encounter for screening mammogram for malignant neoplasm of breast: Secondary | ICD-10-CM | POA: Diagnosis not present

## 2023-07-24 ENCOUNTER — Other Ambulatory Visit: Payer: Self-pay

## 2023-07-24 DIAGNOSIS — I10 Essential (primary) hypertension: Secondary | ICD-10-CM | POA: Diagnosis not present

## 2023-07-24 DIAGNOSIS — E785 Hyperlipidemia, unspecified: Secondary | ICD-10-CM | POA: Diagnosis not present

## 2023-07-24 DIAGNOSIS — R7302 Impaired glucose tolerance (oral): Secondary | ICD-10-CM

## 2023-07-25 LAB — CMP14+EGFR
ALT: 20 [IU]/L (ref 0–32)
AST: 20 [IU]/L (ref 0–40)
Albumin: 3.7 g/dL (ref 3.7–4.7)
Alkaline Phosphatase: 77 [IU]/L (ref 44–121)
BUN/Creatinine Ratio: 19 (ref 12–28)
BUN: 15 mg/dL (ref 8–27)
Bilirubin Total: 0.4 mg/dL (ref 0.0–1.2)
CO2: 25 mmol/L (ref 20–29)
Calcium: 9.6 mg/dL (ref 8.7–10.3)
Chloride: 104 mmol/L (ref 96–106)
Creatinine, Ser: 0.77 mg/dL (ref 0.57–1.00)
Globulin, Total: 2.4 g/dL (ref 1.5–4.5)
Glucose: 86 mg/dL (ref 70–99)
Potassium: 4.3 mmol/L (ref 3.5–5.2)
Sodium: 143 mmol/L (ref 134–144)
Total Protein: 6.1 g/dL (ref 6.0–8.5)
eGFR: 75 mL/min/{1.73_m2} (ref >59)

## 2023-07-25 LAB — HEMOGLOBIN A1C
Est. average glucose Bld gHb Est-mCnc: 123 mg/dL
Hgb A1c MFr Bld: 5.9 % — ABNORMAL HIGH (ref 4.8–5.6)

## 2023-07-25 LAB — LIPID PANEL
Chol/HDL Ratio: 2.7 {ratio} (ref 0.0–4.4)
Cholesterol, Total: 142 mg/dL (ref 100–199)
HDL: 52 mg/dL (ref >39)
LDL Chol Calc (NIH): 77 mg/dL (ref 0–99)
Triglycerides: 66 mg/dL (ref 0–149)
VLDL Cholesterol Cal: 13 mg/dL (ref 5–40)

## 2023-07-31 ENCOUNTER — Ambulatory Visit (INDEPENDENT_AMBULATORY_CARE_PROVIDER_SITE_OTHER): Payer: No Typology Code available for payment source | Admitting: Family Medicine

## 2023-07-31 ENCOUNTER — Encounter: Payer: Self-pay | Admitting: Family Medicine

## 2023-07-31 VITALS — BP 130/80 | HR 62 | Ht 68.0 in | Wt 210.0 lb

## 2023-07-31 DIAGNOSIS — E559 Vitamin D deficiency, unspecified: Secondary | ICD-10-CM

## 2023-07-31 DIAGNOSIS — E785 Hyperlipidemia, unspecified: Secondary | ICD-10-CM | POA: Diagnosis not present

## 2023-07-31 DIAGNOSIS — R7302 Impaired glucose tolerance (oral): Secondary | ICD-10-CM

## 2023-07-31 DIAGNOSIS — Z0001 Encounter for general adult medical examination with abnormal findings: Secondary | ICD-10-CM

## 2023-07-31 DIAGNOSIS — I10 Essential (primary) hypertension: Secondary | ICD-10-CM | POA: Diagnosis not present

## 2023-07-31 DIAGNOSIS — Z23 Encounter for immunization: Secondary | ICD-10-CM

## 2023-07-31 DIAGNOSIS — Z0000 Encounter for general adult medical examination without abnormal findings: Secondary | ICD-10-CM

## 2023-07-31 MED ORDER — FLUTICASONE PROPIONATE 50 MCG/ACT NA SUSP: 1.0000 | Freq: Every day | NASAL | 2 refills | Status: DC

## 2023-07-31 MED ORDER — CHLORPHENIRAMINE MALEATE 4 MG PO TABS: ORAL_TABLET | ORAL | 0 refills | Status: DC

## 2023-07-31 NOTE — Progress Notes (Signed)
    Tammy Galvan     MRN: [id number]      DOB: January 01, 1933  Chief Complaint  Patient presents with   Annual Exam    CPE, Cold drainage    HPI: Patient is in for annual physical exam. No other health concerns are expressed or addressed at the visit. Recent labs,  are reviewed. Immunization is reviewed , and  updated if needed.   PE: BP 130/80   Pulse 62   Ht 5\' 8"  (1.727 m)   Wt 210 lb (95.3 kg)   SpO2 97%   BMI 31.93 kg/m   Pleasant  female, alert and oriented x 3, in no cardio-pulmonary distress. Afebrile. HEENT No facial trauma or asymetry. Sinuses non tender.  Extra occullar muscles intact.. External ears normal, . Neck: supple, no adenopathy,JVD or thyromegaly.No bruits.  Chest: Clear to ascultation bilaterally.No crackles or wheezes. Non tender to palpation    Cardiovascular system; Heart sounds normal,  S1 and  S2 ,no S3.  No murmur, or thrill. Apical beat not displaced Peripheral pulses normal.  Abdomen: Soft, non tender,  Musculoskeletal exam: Decrease though adequate  ROM of spine, hips , shoulders and knees. deformity ,swelling and  crepitus noted. No muscle wasting or atrophy.   Neurologic: Cranial nerves 2 to 12 intact. Power, tone ,sensation normal throughout. Mild  disturbance in gait. No tremor.  Skin: Intact, no ulceration, erythema , scaling or rash noted. Pigmentation normal throughout  Psych; Normal mood and affect. Judgement and concentration normal   Assessment & Plan:  Annual physical exam Annual exam as documented. Counseling done  re healthy lifestyle involving commitment to 150 minutes exercise per week, heart healthy diet, and attaining healthy weight.The importance of adequate sleep also discussed. Regular seat belt use and home safety, is also discussed. Changes in health habits are decided on by the patient with goals and time frames  set for achieving them. Immunization and cancer screening needs are specifically  addressed at this visit.

## 2023-07-31 NOTE — Patient Instructions (Addendum)
F/u in 6 months, call if you need me sooner  Flu vaccine today  Fluticasone and chlorpheniramine are prescribed for drainage  You DO NEED TdAP and Covid vaccines, please get them at your pharmacy  Excellent labs and exam   CONGRATS on weight loss through healthier eating, keep it up!  It is important that you exercise regularly at least 30 minutes 5 times a week. If you develop chest pain, have severe difficulty breathing, or feel very tired, stop exercising immediately and seek medical attention    Fasting CBC, lipid, cmp and EGFr, HBA1C, vit D 3 to 5 days before April visit  Thanks for choosing [facility], we consider it a privelige to serve you.

## 2023-08-02 NOTE — Assessment & Plan Note (Signed)

## 2023-08-13 ENCOUNTER — Other Ambulatory Visit: Payer: Self-pay | Admitting: Family Medicine

## 2023-09-22 ENCOUNTER — Telehealth: Payer: Self-pay | Admitting: Family Medicine

## 2023-09-22 NOTE — Telephone Encounter (Signed)
Copied from CRM [id number]. Topic: Clinical - Medical Advice >> Sep 22, 2023  3:24 PM Alona Bene A wrote: Reason for CRM: Patient has upcoming appointment 09/30/23 and wants to know if she will need to have bloodwork done before the appointment. Please give patient a callback at [phone].   Chief Complaint:  pt has appt on 09/29/2023 for AWV and wanted to know if lab work was needed prior to visit Symptoms: no s/s: nothing abnormal reported  Disposition: advice only Additional Notes: nurse advised at present time nothing in system for lab appointment: appointment on 09/29/2023 is an AWV appointment and if PCP needs labs PCP will order labs at office visit.  Informed patient would route a telephone encounter over to office to ask PCP office if labs needed prior and if they are needed someone will reach out: also informed patient if she does not receive a call stating otherwise regarding labs just come in at regular scheduled appt date and time.  Patient verbalized understanding.

## 2023-09-23 NOTE — Telephone Encounter (Signed)
Patient aware.

## 2023-09-29 ENCOUNTER — Ambulatory Visit (INDEPENDENT_AMBULATORY_CARE_PROVIDER_SITE_OTHER): Payer: No Typology Code available for payment source

## 2023-09-29 VITALS — Ht 68.0 in | Wt 205.0 lb

## 2023-09-29 DIAGNOSIS — Z0000 Encounter for general adult medical examination without abnormal findings: Secondary | ICD-10-CM

## 2023-09-29 NOTE — Progress Notes (Signed)
Because this visit was a virtual/telehealth visit,  certain criteria was not obtained, such a blood pressure, CBG if applicable, and timed get up and go. Any medications not marked as "taking" were not mentioned during the medication reconciliation part of the visit. Any vitals not documented were not able to be obtained due to this being a telehealth visit or patient was unable to self-report a recent blood pressure reading due to a lack of equipment at home via telehealth. Vitals that have been documented are verbally provided by the patient.   Subjective:   Tammy Galvan is a 89 y.o. female who presents for Medicare Annual (Subsequent) preventive examination.  Visit Complete: Virtual I connected with  Tammy Galvan on 09/29/23 by a audio enabled telemedicine application and verified that I am speaking with the correct person using two identifiers.  Patient Location: Home  Provider Location: Home Office  I discussed the limitations of evaluation and management by telemedicine. The patient expressed understanding and agreed to proceed.  Vital Signs: Because this visit was a virtual/telehealth visit, some criteria may be missing or patient reported. Any vitals not documented were not able to be obtained and vitals that have been documented are patient reported.  Patient Medicare AWV questionnaire was completed by the patient on na; I have confirmed that all information answered by patient is correct and no changes since this date.  Cardiac Risk Factors include: advanced age (>24men, >44 women);dyslipidemia;hypertension;obesity (BMI >30kg/m2)     Objective:    Today's Vitals   09/29/23 0809 09/29/23 0812  Weight: 205 lb (93 kg)   Height: 5\' 8"  (1.727 m)   PainSc:  0-No pain   Body mass index is 31.17 kg/m.     09/29/2023    8:09 AM 08/27/2022    9:14 AM 08/22/2021   10:29 AM 12/22/2020    3:42 PM 08/09/2020    8:33 AM 08/06/2019    8:39 AM 02/17/2017   10:09 AM  Advanced Directives   Does Patient Have a Medical Advance Directive? No No No No No No No  Would patient like information on creating a medical advance directive? No - Patient declined Yes (MAU/Ambulatory/Procedural Areas - Information given) Yes (MAU/Ambulatory/Procedural Areas - Information given) No - Patient declined No - Patient declined No - Patient declined Yes (MAU/Ambulatory/Procedural Areas - Information given)    Current Medications (verified) Outpatient Encounter Medications as of 09/29/2023  Medication Sig   amLODipine (NORVASC) 10 MG tablet TAKE 1 TABLET BY MOUTH EVERY DAY   brimonidine-timolol (COMBIGAN) 0.2-0.5 % ophthalmic solution Place 1 drop into both eyes 2 (two) times daily.   chlorpheniramine (CHLOR-TRIMETON) 4 MG tablet Take one tablet by mouth once daily, as needed, for excessive nasal drainage   fluticasone (FLONASE) 50 MCG/ACT nasal spray Place 1 spray into both nostrils daily.   hydrochlorothiazide (HYDRODIURIL) 25 MG tablet TAKE 1 TABLET BY MOUTH EVERY DAY   ibuprofen (ADVIL) 600 MG tablet TAKE ONE TABLET BY MOUTH TWO TIMES DAILY AS NEEDED, FOR BACK PAIN   KLOR-CON M20 20 MEQ tablet TAKE 1 TABLET BY MOUTH EVERY DAY   levothyroxine (SYNTHROID) 75 MCG tablet Take 1 tablet (75 mcg total) by mouth daily before breakfast.   metoprolol succinate (TOPROL-XL) 25 MG 24 hr tablet TAKE 1 TABLET BY MOUTH EVERY DAY   pravastatin (PRAVACHOL) 10 MG tablet TAKE 1 TABLET BY MOUTH EVERY DAY   No facility-administered encounter medications on file as of 09/29/2023.    Allergies (verified) Iodine and Ace  inhibitors   History: Past Medical History:  Diagnosis Date   Glaucoma 2010   Hyperlipidemia    Hypertension    Obesity    Prediabetes 2013   Thyroid disease    TIA (transient ischemic attack)    Past Surgical History:  Procedure Laterality Date   ABDOMINAL HYSTERECTOMY  1986   CATARACT EXTRACTION     COLONOSCOPY  07/01/2012   Procedure: COLONOSCOPY;  Surgeon: Malissa Hippo, MD;   Location: [facility];  Service: Endoscopy;  Laterality: N/A;  830   EYE SURGERY      bilateral cataract exrtaction   FRACTURE SURGERY     right ankle and left ankle    Family History  Problem Relation Age of Onset   Arthritis Mother    Diabetes Father    Heart disease Father    Stroke Sister    Diabetes Sister    Pulmonary embolism Sister    Kidney disease Brother        on dialysis   Kidney failure Brother    Social History   Socioeconomic History   Marital status: Widowed    Spouse name: Not on file   Number of children: Not on file   Years of education: Not on file   Highest education level: Not on file  Occupational History   Occupation: Retired   Tobacco Use   Smoking status: Never   Smokeless tobacco: Never  Vaping Use   Vaping status: Never Used  Substance and Sexual Activity   Alcohol use: No   Drug use: No   Sexual activity: Never    Birth control/protection: Surgical  Other Topics Concern   Not on file  Social History Narrative   Not on file   Social Determinants of Health   Financial Resource Strain: Medium Risk (09/29/2023)   Overall Financial Resource Strain (CARDIA)    Difficulty of Paying Living Expenses: Somewhat hard  Food Insecurity: No Food Insecurity (09/29/2023)   Hunger Vital Sign    Worried About Running Out of Food in the Last Year: Never true    Ran Out of Food in the Last Year: Never true  Transportation Needs: No Transportation Needs (09/29/2023)   PRAPARE - Administrator, Civil Service (Medical): No    Lack of Transportation (Non-Medical): No  Physical Activity: Insufficiently Active (09/29/2023)   Exercise Vital Sign    Days of Exercise per Week: 4 days    Minutes of Exercise per Session: 20 min  Stress: No Stress Concern Present (09/29/2023)   [employer] of Occupational Health - Occupational Stress Questionnaire    Feeling of Stress : Not at all  Social Connections: Moderately Integrated (09/29/2023)    Social Connection and Isolation Panel [NHANES]    Frequency of Communication with Friends and Family: More than three times a week    Frequency of Social Gatherings with Friends and Family: More than three times a week    Attends Religious Services: More than 4 times per year    Active Member of [employer] or Organizations: Yes    Attends Banker Meetings: More than 4 times per year    Marital Status: Widowed    Tobacco Counseling Counseling given: Yes   Clinical Intake:  Pre-visit preparation completed: Yes  Pain : No/denies pain Pain Score: 0-No pain     BMI - recorded: 31.17 Nutritional Risks: None Diabetes: No  How often do you need to have someone help you when you read  instructions, pamphlets, or other written materials from your doctor or pharmacy?: 1 - Never  Interpreter Needed?: No  Information entered by :: Abby Sierah Lacewell, CMA   Activities of Daily Living    09/29/2023    8:15 AM  In your present state of health, do you have any difficulty performing the following activities:  Hearing? 1  Comment wears hearing aids  Vision? 0  Comment sees Dr. Charise Killian w/ [facility] and Dr. June Leap  Difficulty concentrating or making decisions? 0  Walking or climbing stairs? 0  Dressing or bathing? 0  Doing errands, shopping? 0  Preparing Food and eating ? N  Using the Toilet? N  In the past six months, have you accidently leaked urine? N  Do you have problems with loss of bowel control? N  Managing your Medications? N  Managing your Finances? N  Housekeeping or managing your Housekeeping? N    Patient Care Team: Kerri Perches, MD as PCP - General  Indicate any recent Medical Services you may have received from other than [facility] providers in the past year (date may be approximate).     Assessment:   This is a routine wellness examination for Tammy Galvan.  Hearing/Vision screen Hearing Screening - Comments:: Patient has difficulties hearing. Wears hearing  aids. Up to date with exams with audiology. Sees Dr. Neale Burly with [facility] in [place] Vision Screening - Comments:: Wears rx glasses - up to date with routine eye exams     Goals Addressed             This Visit's Progress    Patient Stated       Lose more weight and maybe travel a little more       Depression Screen    09/29/2023    8:20 AM 07/31/2023    9:48 AM 01/24/2023    9:24 AM 08/27/2022    9:17 AM 07/25/2022    1:12 PM 12/05/2021    8:30 AM 08/22/2021   10:30 AM  PHQ 2/9 Scores  PHQ - 2 Score 0 0 0 0 0 0 0  PHQ- 9 Score 0          Fall Risk    09/29/2023    8:25 AM 07/31/2023    9:48 AM 01/24/2023    9:24 AM 08/27/2022    9:17 AM 07/25/2022    1:12 PM  Fall Risk   Falls in the past year? 0 0 0 0 0  Number falls in past yr: 0 0 0 0 0  Injury with Fall? 0 0 0 0 0  Risk for fall due to : No Fall Risks No Fall Risks No Fall Risks    Follow up Falls prevention discussed;Education provided Falls evaluation completed Falls evaluation completed  Falls evaluation completed    MEDICARE RISK AT HOME: Medicare Risk at Home Any stairs in or around the home?: Yes If so, are there any without handrails?: No Home free of loose throw rugs in walkways, pet beds, electrical cords, etc?: Yes Adequate lighting in your home to reduce risk of falls?: Yes Life alert?: No Use of a cane, walker or w/c?: No Grab bars in the bathroom?: No Shower chair or bench in shower?: Yes Elevated toilet seat or a handicapped toilet?: Yes  TIMED UP AND GO:  Was the test performed?  No    Cognitive Function:    08/22/2021   10:30 AM  MMSE - Mini Mental State Exam  Not completed: Unable  to complete        09/29/2023    8:14 AM 08/27/2022    9:18 AM 08/22/2021   10:31 AM 08/09/2020    8:40 AM 08/06/2019    8:45 AM  6CIT Screen  What Year? 0 points 0 points 0 points 0 points 0 points  What month? 0 points 0 points 0 points 0 points 0 points  What time? 0 points 0 points 0 points 0  points 0 points  Count back from 20 0 points 0 points 0 points 0 points 0 points  Months in reverse 0 points 0 points 0 points 0 points 0 points  Repeat phrase 0 points 0 points 0 points 0 points 0 points  Total Score 0 points 0 points 0 points 0 points 0 points    Immunizations Immunization History  Administered Date(s) Administered   Fluad Quad(high Dose 65+) 07/21/2019, 06/27/2020, 07/24/2021, 07/25/2022   Fluad Trivalent(High Dose 65+) 07/31/2023   Influenza Split 08/30/2011, 07/08/2012   Influenza Whole 08/05/2007, 06/26/2010   Influenza, High Dose Seasonal PF 07/27/2018   Influenza,inj,Quad PF,6+ Mos 07/15/2013, 06/14/2014, 10/19/2015, 08/08/2016, 07/29/2017   Moderna Sars-Covid-2 Vaccination 12/16/2019, 01/14/2020, 07/21/2020   Pneumococcal Conjugate-13 06/15/2015   Pneumococcal Polysaccharide-23 04/03/2009   Td 06/18/2007   Zoster Recombinant(Shingrix) 06/04/2021, 09/29/2021    TDAP status: Due, Education has been provided regarding the importance of this vaccine. Advised may receive this vaccine at local pharmacy or Health Dept. Aware to provide a copy of the vaccination record if obtained from local pharmacy or Health Dept. Verbalized acceptance and understanding.  Flu Vaccine status: Up to date  Pneumococcal vaccine status: Up to date  Covid-19 vaccine status: Information provided on how to obtain vaccines.   Qualifies for Shingles Vaccine? No   Zostavax completed No   Shingrix Completed?: Yes  Screening Tests Health Maintenance  Topic Date Due   DTaP/Tdap/Td (2 - Tdap) 06/17/2017   COVID-19 Vaccine (4 - 2023-24 season) 06/22/2023   Medicare Annual Wellness (AWV)  08/28/2023   Pneumonia Vaccine 49+ Years old  Completed   INFLUENZA VACCINE  Completed   DEXA SCAN  Completed   Zoster Vaccines- Shingrix  Completed   HPV VACCINES  Aged Out    Health Maintenance  Health Maintenance Due  Topic Date Due   DTaP/Tdap/Td (2 - Tdap) 06/17/2017   COVID-19 Vaccine (4  - 2023-24 season) 06/22/2023   Medicare Annual Wellness (AWV)  08/28/2023    Colorectal cancer screening: No longer required.  07/07/2023  Bone Density Screening: Not age appropriate for this patient.   Lung Cancer Screening: (Low Dose CT Chest recommended if Age 40-80 years, 20 pack-year currently smoking OR have quit w/in 15years.) does not qualify.   Lung Cancer Screening Referral: na  Additional Screening:  Hepatitis C Screening: does not qualify; Completed   Vision Screening: Recommended annual ophthalmology exams for early detection of glaucoma and other disorders of the eye. Is the patient up to date with their annual eye exam?  Yes  Who is the provider or what is the name of the office in which the patient attends annual eye exams? Dr. Daisy Lazar and Dr. Fabio Pierce If pt is not established with a provider, would they like to be referred to a provider to establish care? No .   Dental Screening: Recommended annual dental exams for proper oral hygiene  Diabetic Foot Exam: na  Community Resource Referral / Chronic Care Management: CRR required this visit?  No   CCM required this  visit?  No     Plan:     I have personally reviewed and noted the following in the patient's chart:   Medical and social history Use of alcohol, tobacco or illicit drugs  Current medications and supplements including opioid prescriptions. Patient is not currently taking opioid prescriptions. Functional ability and status Nutritional status Physical activity Advanced directives List of other physicians Hospitalizations, surgeries, and ER visits in previous 12 months Vitals Screenings to include cognitive, depression, and falls Referrals and appointments  In addition, I have reviewed and discussed with patient certain preventive protocols, quality metrics, and best practice recommendations. A written personalized care plan for preventive services as well as general preventive health  recommendations were provided to patient.     Jordan Hawks Kimmie Berggren, CMA   09/29/2023   After Visit Summary: (Mail) Due to this being a telephonic visit, the after visit summary with patients personalized plan was offered to patient via mail   Nurse Notes: none

## 2023-09-29 NOTE — Patient Instructions (Signed)
Tammy Galvan , Thank you for taking time to come for your Medicare Wellness Visit. I appreciate your ongoing commitment to your health goals. Please review the following plan we discussed and let me know if I can assist you in the future.   Referrals/Orders/Follow-Ups/Clinician Recommendations:  Next Medicare Annual Wellness Visit: September 29, 2024 at 8:00 am virtual appointment  You are due for the vaccines checked below. You may have these done at your preferred pharmacy. Please have them fax the office proof of the vaccines so that we can update your chart.   []  Flu (due annually)  Recommended this fall either at PCP office or through your local pharmacy. The flu season starts August 1 of each year.   []  Shingrix (Shingles vaccine): CDC recommends 2 doses of Shingrix separated by 2-6 months for aged 28 years and older:  []  Pneumonia Vaccines: Recommended for adults 65 years or older  [x]  TDAP (Tetanus) Vaccine every 10 years:Recommended every 10 years; Please call your insurance company to determine your out of pocket expense. You also receive this vaccine at your local pharmacy or Health Dept.  [x]  Covid-19: Available now at any [facility] pharmacy (see info below)  You may also get your vaccines at any [facility] (locations listed below.) Vaccine hours are Monday - Friday 9:00 - 4:00. No appointments are required. Most insurances are accepted including Medicaid. Anyone can use the community pharmacies, and people are not required to have a [facility] provider.  Community Pharmacy Locations offering vaccines:   Sport and exercise psychologist   [facility]  10 vaccines are offered at the [employer]: Covid, flu, Tdap, shingles, RSV, pneumonia, meningococcal, hepatitis A, hepatitis B, and HPV.    This is a list of the screening recommended for you and due dates:  Health  Maintenance  Topic Date Due   DTaP/Tdap/Td vaccine (2 - Tdap) 06/17/2017   COVID-19 Vaccine (4 - 2023-24 season) 06/22/2023   Mammogram  07/06/2024   Medicare Annual Wellness Visit  09/28/2024   Pneumonia Vaccine  Completed   Flu Shot  Completed   DEXA scan (bone density measurement)  Completed   Zoster (Shingles) Vaccine  Completed   HPV Vaccine  Aged Out    Advanced directives: (ACP Link)Information on Advanced Care Planning can be found at [facility] Advance Health Care Directives Advance Health Care Directives ([email or web address]   Next Medicare Annual Wellness Visit scheduled for next year: Yes  Preventive Care 65 Years and Older, Female Preventive care refers to lifestyle choices and visits with your health care provider that can promote health and wellness. Preventive care visits are also called wellness exams. What can I expect for my preventive care visit? Counseling Your health care provider may ask you questions about your: Medical history, including: Past medical problems. Family medical history. Pregnancy and menstrual history. History of falls. Current health, including: Memory and ability to understand (cognition). Emotional well-being. Home life and relationship well-being. Sexual activity and sexual health. Lifestyle, including: Alcohol, nicotine or tobacco, and drug use. Access to firearms. Diet, exercise, and sleep habits. Work and work Astronomer. Sunscreen use. Safety issues such as seatbelt and bike helmet use. Physical exam Your health care provider will check your: Height and weight. These may be used to calculate your BMI (body mass index). BMI is a measurement that tells if you are at a healthy weight.  Waist circumference. This measures the distance around your waistline. This measurement also tells if you are at a healthy weight and may help predict your risk of certain diseases, such as type 2 diabetes and high blood  pressure. Heart rate and blood pressure. Body temperature. Skin for abnormal spots. What immunizations do I need?  Vaccines are usually given at various ages, according to a schedule. Your health care provider will recommend vaccines for you based on your age, medical history, and lifestyle or other factors, such as travel or where you work. What tests do I need? Screening Your health care provider may recommend screening tests for certain conditions. This may include: Lipid and cholesterol levels. Hepatitis C test. Hepatitis B test. HIV (human immunodeficiency virus) test. STI (sexually transmitted infection) testing, if you are at risk. Lung cancer screening. Colorectal cancer screening. Diabetes screening. This is done by checking your blood sugar (glucose) after you have not eaten for a while (fasting). Mammogram. Talk with your health care provider about how often you should have regular mammograms. BRCA-related cancer screening. This may be done if you have a family history of breast, ovarian, tubal, or peritoneal cancers. Bone density scan. This is done to screen for osteoporosis. Talk with your health care provider about your test results, treatment options, and if necessary, the need for more tests. Follow these instructions at home: Eating and drinking  Eat a diet that includes fresh fruits and vegetables, whole grains, lean protein, and low-fat dairy products. Limit your intake of foods with high amounts of sugar, saturated fats, and salt. Take vitamin and mineral supplements as recommended by your health care provider. Do not drink alcohol if your health care provider tells you not to drink. If you drink alcohol: Limit how much you have to 0-1 drink a day. Know how much alcohol is in your drink. In the [place] one drink equals one 12 oz bottle of beer (355 mL), one 5 oz glass of wine (148 mL), or one 1 oz glass of hard liquor (44 mL). Lifestyle Brush your teeth every  morning and night with fluoride toothpaste. Floss one time each day. Exercise for at least 30 minutes 5 or more days each week. Do not use any products that contain nicotine or tobacco. These products include cigarettes, chewing tobacco, and vaping devices, such as e-cigarettes. If you need help quitting, ask your health care provider. Do not use drugs. If you are sexually active, practice safe sex. Use a condom or other form of protection in order to prevent STIs. Take aspirin only as told by your health care provider. Make sure that you understand how much to take and what form to take. Work with your health care provider to find out whether it is safe and beneficial for you to take aspirin daily. Ask your health care provider if you need to take a cholesterol-lowering medicine (statin). Find healthy ways to manage stress, such as: Meditation, yoga, or listening to music. Journaling. Talking to a trusted person. Spending time with friends and family. Minimize exposure to UV radiation to reduce your risk of skin cancer. Safety Always wear your seat belt while driving or riding in a vehicle. Do not drive: If you have been drinking alcohol. Do not ride with someone who has been drinking. When you are tired or distracted. While texting. If you have been using any mind-altering substances or drugs. Wear a helmet and other protective equipment during sports activities. If you have firearms in your house, make  sure you follow all gun safety procedures. What's next? Visit your health care provider once a year for an annual wellness visit. Ask your health care provider how often you should have your eyes and teeth checked. Stay up to date on all vaccines. This information is not intended to replace advice given to you by your health care provider. Make sure you discuss any questions you have with your health care provider. Document Revised: 04/04/2021 Document Reviewed: 04/04/2021 Elsevier  Patient Education  2024 [employer]. Understanding Your Risk for Falls Millions of people have serious injuries from falls each year. It is important to understand your risk of falling. Talk with your health care provider about your risk and what you can do to lower it. If you do have a serious fall, make sure to tell your provider. Falling once raises your risk of falling again. How can falls affect me? Serious injuries from falls are common. These include: Broken bones, such as hip fractures. Head injuries, such as traumatic brain injuries (TBI) or concussions. A fear of falling can cause you to avoid activities and stay at home. This can make your muscles weaker and raise your risk for a fall. What can increase my risk? There are a number of risk factors that increase your risk for falling. The more risk factors you have, the higher your risk of falling. Serious injuries from a fall happen most often to people who are older than 87 years old. Teenagers and young adults ages 58-29 are also at higher risk. Common risk factors include: Weakness in the lower body. Being generally weak or confused due to long-term (chronic) illness. Dizziness or balance problems. Poor vision. Medicines that cause dizziness or drowsiness. These may include: Medicines for your blood pressure, heart, anxiety, insomnia, or swelling (edema). Pain medicines. Muscle relaxants. Other risk factors include: Drinking alcohol. Having had a fall in the past. Having foot pain or wearing improper footwear. Working at a dangerous job. Having any of the following in your home: Tripping hazards, such as floor clutter or loose rugs. Poor lighting. Pets. Having dementia or memory loss. What actions can I take to lower my risk of falling?     Physical activity Stay physically fit. Do strength and balance exercises. Consider taking a regular class to build strength and balance. Yoga and tai chi are good  options. Vision Have your eyes checked every year and your prescription for glasses or contacts updated as needed. Shoes and walking aids Wear non-skid shoes. Wear shoes that have rubber soles and low heels. Do not wear high heels. Do not walk around the house in socks or slippers. Use a cane or walker as told by your provider. Home safety Attach secure railings on both sides of your stairs. Install grab bars for your bathtub, shower, and toilet. Use a non-skid mat in your bathtub or shower. Attach bath mats securely with double-sided, non-slip rug tape. Use good lighting in all rooms. Keep a flashlight near your bed. Make sure there is a clear path from your bed to the bathroom. Use night-lights. Do not use throw rugs. Make sure all carpeting is taped or tacked down securely. Remove all clutter from walkways and stairways, including extension cords. Repair uneven or broken steps and floors. Avoid walking on icy or slippery surfaces. Walk on the grass instead of on icy or slick sidewalks. Use ice melter to get rid of ice on walkways in the winter. Use a cordless phone. Questions to ask your health  care provider Can you help me check my risk for a fall? Do any of my medicines make me more likely to fall? Should I take a vitamin D supplement? What exercises can I do to improve my strength and balance? Should I make an appointment to have my vision checked? Do I need a bone density test to check for weak bones (osteoporosis)? Would it help to use a cane or a walker? Where to find more information Centers for Disease Control and Prevention, STEADI: TonerPromos.no Community-Based Fall Prevention Programs: TonerPromos.no [employer] on Aging: BaseRingTones.pl Contact a health care provider if: You fall at home. You are afraid of falling at home. You feel weak, drowsy, or dizzy. This information is not intended to replace advice given to you by your health care provider. Make sure you discuss any  questions you have with your health care provider. Document Revised: 06/10/2022 Document Reviewed: 06/10/2022 Elsevier Patient Education  2024 [employer].

## 2023-10-15 ENCOUNTER — Other Ambulatory Visit: Payer: Self-pay | Admitting: Family Medicine

## 2023-10-24 ENCOUNTER — Other Ambulatory Visit: Payer: Self-pay | Admitting: "Endocrinology

## 2023-11-13 ENCOUNTER — Telehealth: Payer: Self-pay | Admitting: Family Medicine

## 2023-11-13 NOTE — Telephone Encounter (Signed)
Disability placard  Noted  Copied Sleeved  Original in PCP box Copy front desk folder

## 2023-11-19 NOTE — Telephone Encounter (Signed)
Called patient will pick up form

## 2023-11-22 ENCOUNTER — Other Ambulatory Visit: Payer: Self-pay | Admitting: Family Medicine

## 2023-12-23 DIAGNOSIS — E782 Mixed hyperlipidemia: Secondary | ICD-10-CM | POA: Diagnosis not present

## 2023-12-23 DIAGNOSIS — E039 Hypothyroidism, unspecified: Secondary | ICD-10-CM | POA: Diagnosis not present

## 2023-12-24 LAB — LIPID PANEL
Chol/HDL Ratio: 2.7 ratio (ref 0.0–4.4)
Cholesterol, Total: 167 mg/dL (ref 100–199)
HDL: 61 mg/dL (ref >39)
LDL Chol Calc (NIH): 92 mg/dL (ref 0–99)
Triglycerides: 71 mg/dL (ref 0–149)
VLDL Cholesterol Cal: 14 mg/dL (ref 5–40)

## 2023-12-24 LAB — T4, FREE: Free T4: 1.61 ng/dL (ref 0.82–1.77)

## 2023-12-24 LAB — TSH: TSH: 2.62 u[IU]/mL (ref 0.450–4.500)

## 2023-12-29 ENCOUNTER — Encounter: Payer: Self-pay | Admitting: "Endocrinology

## 2023-12-29 ENCOUNTER — Ambulatory Visit (INDEPENDENT_AMBULATORY_CARE_PROVIDER_SITE_OTHER): Payer: No Typology Code available for payment source | Admitting: "Endocrinology

## 2023-12-29 VITALS — BP 122/66 | HR 60 | Ht 68.0 in | Wt 208.2 lb

## 2023-12-29 DIAGNOSIS — E039 Hypothyroidism, unspecified: Secondary | ICD-10-CM

## 2023-12-29 DIAGNOSIS — R7303 Prediabetes: Secondary | ICD-10-CM

## 2023-12-29 DIAGNOSIS — E782 Mixed hyperlipidemia: Secondary | ICD-10-CM | POA: Diagnosis not present

## 2023-12-29 LAB — POCT GLYCOSYLATED HEMOGLOBIN (HGB A1C): HbA1c, POC (controlled diabetic range): 5.7 % (ref 0.0–7.0)

## 2023-12-29 NOTE — Progress Notes (Signed)
 12/29/2023     Endocrinology follow-up note  Chief complaint: Follow-up for hypothyroidism   HPI  [name] is a 89 y.o.-year-old female. -She is being seen in follow-up for management of hypothyroidism, and multinodular goiter.     She is currently on levothyroxine 75 mcg p.o. daily before breakfast.  She reports compliance and consistency with the medication.  Her previsit thyroid function tests are consistent with appropriate replacement.  She has no new complaints today.   She has steady weight, no palpitations, tremors, heat/cold intolerance.  -Her last thyroid ultrasound was consistent with slight shrinking of bilateral thyroid lobes as well as previously documented nodules.  Left thyroid lobe nodule fine-needle aspiration in November 2016 was negative for malignancy. She presents with significant weight loss, improving her A1c, as well as her lipid panel.  See below. ROS: Limited as above.  PE: BP 122/66   Pulse 60   Ht 5\' 8"  (1.727 m)   Wt 208 lb 3.2 oz (94.4 kg)   BMI 31.66 kg/m  Wt Readings from Last 3 Encounters:  12/29/23 208 lb 3.2 oz (94.4 kg)  09/29/23 205 lb (93 kg)  07/31/23 210 lb (95.3 kg)     June 03, 2018 thyroid ultrasound IMPRESSION: 1. Normal-sized thyroid with stable solitary left nodule, previously biopsied. No indication for biopsy or dedicated imaging follow-up  August 23, 2015 fine-needle aspiration of left lobe nodule benign follicular nodule.   Recent Results (from the past 2160 hours)  TSH     Status: None   Collection Time: 12/23/23  9:22 AM  Result Value Ref Range   TSH 2.620 0.450 - 4.500 uIU/mL  T4, free     Status: None   Collection Time: 12/23/23  9:22 AM  Result Value Ref Range   Free T4 1.61 0.82 - 1.77 ng/dL  Lipid panel     Status: None   Collection Time: 12/23/23  9:22 AM  Result Value Ref Range   Cholesterol, Total 167 100 - 199 mg/dL   Triglycerides 71 0 - 149 mg/dL   HDL 61 >40 mg/dL   VLDL Cholesterol Cal  14 5 - 40 mg/dL   LDL Chol Calc (NIH) 92 0 - 99 mg/dL   Chol/HDL Ratio 2.7 0.0 - 4.4 ratio    Comment:                                   T. Chol/HDL Ratio                                             Men  Women                               1/2 Avg.Risk  3.4    3.3                                   Avg.Risk  5.0    4.4                                2X Avg.Risk  9.6    7.1  3X Avg.Risk 23.4   11.0   HgB A1c     Status: None   Collection Time: 12/29/23 10:18 AM  Result Value Ref Range   Hemoglobin A1C     HbA1c POC (<> result, manual entry)     HbA1c, POC (prediabetic range)     HbA1c, POC (controlled diabetic range) 5.7 0.0 - 7.0 %    ASSESSMENT: 1. MNG 2. Hypothyroidism 3.  Prediabetes  PLAN:  -Her previsit thyroid function tests are consistent with appropriate replacement.   She is advised to continue Synthroid 75 mcg p.o. daily before breakfast.     - We discussed about the correct intake of her thyroid hormone, on empty stomach at fasting, with water, separated by at least 30 minutes from breakfast and other medications,  and separated by more than 4 hours from calcium, iron, multivitamins, acid reflux medications (PPIs). -Patient is made aware of the fact that thyroid hormone replacement is needed for life, dose to be adjusted by periodic monitoring of thyroid function tests. He  -She is status post fine needle aspiration which was reported to be benign follicular nodule in November 2016 -[name].  Her thyroid ultrasound from June 03, 2018 is consistent with shrinking of bilateral thyroid lobes and previously biopsied nodules.  She will have repeat surveillance thyroid ultrasound before her next visit in a year.    -Regarding her hyperlipidemia, she returns with significant improvement in her LDL stable at 92.  She is partially engaged with whole food plant-based diet.  She is advised to continue pravastatin 10 mg p.o. daily at  bedtime.   She would not need medication intervention for prediabetes at this time.  I advised her to maintain close follow up with Dr. Syliva Overman  for her primary care needs.   I spent  22  minutes in the care of the patient today including review of labs from Thyroid Function, CMP, and other relevant labs ; imaging/biopsy records (current and previous including abstractions from other facilities); face-to-face time discussing  her lab results and symptoms, medications doses, her options of short and long term treatment based on the latest standards of care / guidelines;   and documenting the encounter.  Louanna Raw  participated in the discussions, expressed understanding, and voiced agreement with the above plans.  All questions were answered to her satisfaction. she is encouraged to contact clinic should she have any questions or concerns prior to her return visit.    Marquis Lunch, MD  Tel. [phone], Fax [phone]  -  This note was partially dictated with voice recognition software. Similar sounding words can be transcribed inadequately or may not  be corrected upon review.

## 2024-01-12 ENCOUNTER — Other Ambulatory Visit: Payer: Self-pay | Admitting: Family Medicine

## 2024-01-21 DIAGNOSIS — E559 Vitamin D deficiency, unspecified: Secondary | ICD-10-CM | POA: Diagnosis not present

## 2024-01-21 DIAGNOSIS — R7302 Impaired glucose tolerance (oral): Secondary | ICD-10-CM | POA: Diagnosis not present

## 2024-01-21 DIAGNOSIS — E785 Hyperlipidemia, unspecified: Secondary | ICD-10-CM | POA: Diagnosis not present

## 2024-01-21 DIAGNOSIS — I10 Essential (primary) hypertension: Secondary | ICD-10-CM | POA: Diagnosis not present

## 2024-01-22 LAB — CMP14+EGFR
ALT: 16 IU/L (ref 0–32)
AST: 22 IU/L (ref 0–40)
Albumin: 4.1 g/dL (ref 3.7–4.7)
Alkaline Phosphatase: 82 IU/L (ref 44–121)
BUN/Creatinine Ratio: 16 (ref 12–28)
BUN: 13 mg/dL (ref 8–27)
Bilirubin Total: 0.4 mg/dL (ref 0.0–1.2)
CO2: 25 mmol/L (ref 20–29)
Calcium: 9.6 mg/dL (ref 8.7–10.3)
Chloride: 101 mmol/L (ref 96–106)
Creatinine, Ser: 0.8 mg/dL (ref 0.57–1.00)
Globulin, Total: 2.2 g/dL (ref 1.5–4.5)
Glucose: 72 mg/dL (ref 70–99)
Potassium: 4.3 mmol/L (ref 3.5–5.2)
Sodium: 143 mmol/L (ref 134–144)
Total Protein: 6.3 g/dL (ref 6.0–8.5)
eGFR: 71 mL/min/{1.73_m2} (ref >59)

## 2024-01-22 LAB — CBC
Hematocrit: 45.2 % (ref 34.0–46.6)
Hemoglobin: 14.2 g/dL (ref 11.1–15.9)
MCH: 26.7 pg (ref 26.6–33.0)
MCHC: 31.4 g/dL — ABNORMAL LOW (ref 31.5–35.7)
MCV: 85 fL (ref 79–97)
Platelets: 292 10*3/uL (ref 150–450)
RBC: 5.31 x10E6/uL — ABNORMAL HIGH (ref 3.77–5.28)
RDW: 13.8 % (ref 11.7–15.4)
WBC: 7.9 10*3/uL (ref 3.4–10.8)

## 2024-01-22 LAB — LIPID PANEL
Chol/HDL Ratio: 2.6 ratio (ref 0.0–4.4)
Cholesterol, Total: 151 mg/dL (ref 100–199)
HDL: 58 mg/dL (ref >39)
LDL Chol Calc (NIH): 81 mg/dL (ref 0–99)
Triglycerides: 60 mg/dL (ref 0–149)
VLDL Cholesterol Cal: 12 mg/dL (ref 5–40)

## 2024-01-22 LAB — HEMOGLOBIN A1C
Est. average glucose Bld gHb Est-mCnc: 120 mg/dL
Hgb A1c MFr Bld: 5.8 % — ABNORMAL HIGH (ref 4.8–5.6)

## 2024-01-22 LAB — VITAMIN D 25 HYDROXY (VIT D DEFICIENCY, FRACTURES): Vit D, 25-Hydroxy: 32.8 ng/mL (ref 30.0–100.0)

## 2024-01-24 ENCOUNTER — Other Ambulatory Visit: Payer: Self-pay | Admitting: Family Medicine

## 2024-01-29 ENCOUNTER — Encounter: Payer: Self-pay | Admitting: Family Medicine

## 2024-01-29 ENCOUNTER — Other Ambulatory Visit (HOSPITAL_COMMUNITY): Payer: Self-pay | Admitting: Family Medicine

## 2024-01-29 ENCOUNTER — Ambulatory Visit (INDEPENDENT_AMBULATORY_CARE_PROVIDER_SITE_OTHER): Payer: No Typology Code available for payment source | Admitting: Family Medicine

## 2024-01-29 VITALS — BP 138/74 | HR 70 | Resp 18 | Ht 67.0 in | Wt 205.1 lb

## 2024-01-29 DIAGNOSIS — M170 Bilateral primary osteoarthritis of knee: Secondary | ICD-10-CM

## 2024-01-29 DIAGNOSIS — I10 Essential (primary) hypertension: Secondary | ICD-10-CM

## 2024-01-29 DIAGNOSIS — R7303 Prediabetes: Secondary | ICD-10-CM

## 2024-01-29 DIAGNOSIS — E785 Hyperlipidemia, unspecified: Secondary | ICD-10-CM

## 2024-01-29 DIAGNOSIS — E039 Hypothyroidism, unspecified: Secondary | ICD-10-CM

## 2024-01-29 DIAGNOSIS — M25562 Pain in left knee: Secondary | ICD-10-CM

## 2024-01-29 DIAGNOSIS — Z1231 Encounter for screening mammogram for malignant neoplasm of breast: Secondary | ICD-10-CM

## 2024-01-29 DIAGNOSIS — R058 Other specified cough: Secondary | ICD-10-CM | POA: Diagnosis not present

## 2024-01-29 NOTE — Assessment & Plan Note (Signed)
 Improved Patient educated about the importance of limiting  Carbohydrate intake , the need to commit to daily physical activity for a minimum of 30 minutes , and to commit weight loss. The fact that changes in all these areas will reduce or eliminate all together the development of diabetes is stressed.      Latest Ref Rng & Units 01/21/2024   11:02 AM 12/29/2023   10:18 AM 12/23/2023    9:22 AM 07/24/2023   10:19 AM 06/30/2023   10:18 AM  Diabetic Labs  HbA1c 4.8 - 5.6 % 5.8  5.7   5.9  5.5   Chol 100 - 199 mg/dL 161   096  045    HDL >40 mg/dL 58   61  52    Calc LDL 0 - 99 mg/dL 81   92  77    Triglycerides 0 - 149 mg/dL 60   71  66    Creatinine 0.57 - 1.00 mg/dL 9.81    1.91        4/78/2956   10:34 AM 01/29/2024   10:07 AM 12/29/2023   10:06 AM 09/29/2023    8:09 AM 07/31/2023   10:30 AM 07/31/2023    9:49 AM 07/31/2023    9:47 AM  BP/Weight  Systolic BP 138 154 122 -- 130 135 142  Diastolic BP 74 79 66 -- 80 78 77  Wt. (Lbs)  205.12 208.2 205   210  BMI  32.13 kg/m2 31.66 kg/m2 31.17 kg/m2   31.93 kg/m2       No data to display          Updated lab needed at/ before next visit.

## 2024-01-29 NOTE — Assessment & Plan Note (Signed)
 Reduced pain and stiffness with weight loss

## 2024-01-29 NOTE — Assessment & Plan Note (Signed)
Improved. Pt applauded on succesful weight loss through lifestyle change, and encouraged to continue same. Weight loss goal set for the next several months.  

## 2024-01-29 NOTE — Assessment & Plan Note (Addendum)
 Hyperlipidemia:Low fat diet discussed and encouraged. Controlled, no change in medication    Lipid Panel  Lab Results  Component Value Date   CHOL 151 01/21/2024   HDL 58 01/21/2024   LDLCALC 81 01/21/2024   TRIG 60 01/21/2024   CHOLHDL 2.6 01/21/2024     Updated lab needed at/ before next visit.

## 2024-01-29 NOTE — Assessment & Plan Note (Signed)
 Improved with weight loss and exercise regime she currently has, will add approximately 10 minutes per day and start pedaling/ biking

## 2024-01-29 NOTE — Assessment & Plan Note (Signed)
Managed by Endo and controlled 

## 2024-01-29 NOTE — Patient Instructions (Addendum)
 Annual exam in 6 months, call if you need me sooner  Please schedule mammogram at checkout  Excellent labs and blood pressure   Keep up the good habits that you are cultivating, and remember and honest No is better than a half hearted Yes!  Fasting labs 3 to  5 days before next visit  Thanks for choosing [facility] we consider it a privelige to serve you.

## 2024-01-29 NOTE — Assessment & Plan Note (Addendum)
 Controlled, no change in medication DASH diet and commitment to daily physical activity for a minimum of 30 minutes discussed and encouraged, as a part of hypertension management. The importance of attaining a healthy weight is also discussed.     01/29/2024   10:34 AM 01/29/2024   10:07 AM 12/29/2023   10:06 AM 09/29/2023    8:09 AM 07/31/2023   10:30 AM 07/31/2023    9:49 AM 07/31/2023    9:47 AM  BP/Weight  Systolic BP 138 154 122 -- 130 135 142  Diastolic BP 74 79 66 -- 80 78 77  Wt. (Lbs)  205.12 208.2 205   210  BMI  32.13 kg/m2 31.66 kg/m2 31.17 kg/m2   31.93 kg/m2

## 2024-01-29 NOTE — Progress Notes (Signed)
 Tammy Galvan     MRN: [id number]      DOB: [birth year]  Chief Complaint  Patient presents with   Hypertension    Follow up     HPI Tammy Galvan is here for follow up and re-evaluation of chronic medical conditions, medication management and review of any available recent lab and radiology data.  Preventive health is updated, specifically  Cancer screening and Immunization.   Questions or concerns regarding consultations or procedures which the PT has had in the interim are  addressed. The PT denies any adverse reactions to current medications since the last visit.  There are no new concerns.  There are no specific complaints   ROS Denies recent fever or chills. Denies sinus pressure, nasal congestion, ear pain or sore throat. Denies chest congestion, productive cough or wheezing. Denies chest pains, palpitations and leg swelling Denies abdominal pain, nausea, vomiting,diarrhea or constipation.   Denies dysuria, frequency, hesitancy or incontinence. Denies uncontrolled joint pain, swelling and limitation in mobility. Denies headaches, seizures, numbness, or tingling. Denies depression, anxiety or insomnia. Denies skin break down or rash.   PE  BP 138/74   Pulse 70   Resp 18   Ht 5\' 7"  (1.702 m)   Wt 205 lb 1.9 oz (93 kg)   SpO2 96%   BMI 32.13 kg/m   Patient alert and oriented and in no cardiopulmonary distress.  HEENT: No facial asymmetry, EOMI,     Neck supple .  Chest: Clear to auscultation bilaterally.  CVS: S1, S2 no murmurs, no S3.Regular rate.  ABD: Soft non tender.   Ext: No edema  MS: Adequate though reduced  ROM spine, shoulders, hips and knees.  Skin: Intact, no ulcerations or rash noted.  Psych: Good eye contact, normal affect. Memory intact not anxious or depressed appearing.  CNS: CN 2-12 intact, power,  normal throughout.no focal deficits noted.   Assessment & Plan Hyperlipidemia LDL goal <100 Hyperlipidemia:Low fat diet discussed and  encouraged. Controlled, no change in medication    Lipid Panel  Lab Results  Component Value Date   CHOL 151 01/21/2024   HDL 58 01/21/2024   LDLCALC 81 01/21/2024   TRIG 60 01/21/2024   CHOLHDL 2.6 01/21/2024     Updated lab needed at/ before next visit.   Prediabetes Improved Patient educated about the importance of limiting  Carbohydrate intake , the need to commit to daily physical activity for a minimum of 30 minutes , and to commit weight loss. The fact that changes in all these areas will reduce or eliminate all together the development of diabetes is stressed.      Latest Ref Rng & Units 01/21/2024   11:02 AM 12/29/2023   10:18 AM 12/23/2023    9:22 AM 07/24/2023   10:19 AM 06/30/2023   10:18 AM  Diabetic Labs  HbA1c 4.8 - 5.6 % 5.8  5.7   5.9  5.5   Chol 100 - 199 mg/dL 782   956  213    HDL >08 mg/dL 58   61  52    Calc LDL 0 - 99 mg/dL 81   92  77    Triglycerides 0 - 149 mg/dL 60   71  66    Creatinine 0.57 - 1.00 mg/dL 6.57    8.46        9/62/9528   10:34 AM 01/29/2024   10:07 AM 12/29/2023   10:06 AM 09/29/2023    8:09 AM 07/31/2023  10:30 AM 07/31/2023    9:49 AM 07/31/2023    9:47 AM  BP/Weight  Systolic BP 138 154 122 -- 130 135 142  Diastolic BP 74 79 66 -- 80 78 77  Wt. (Lbs)  205.12 208.2 205   210  BMI  32.13 kg/m2 31.66 kg/m2 31.17 kg/m2   31.93 kg/m2       No data to display          Updated lab needed at/ before next visit.   Morbid obesity (HCC) Improved. Pt applauded on succesful weight loss through lifestyle change, and encouraged to continue same. Weight loss goal set for the next several months.   Hypothyroidism Managed by Endo and controlled  Osteoarthritis of both knees Reduced pain and stiffness with weight loss  Allergic cough Improved and controlled on current regime  Left anterior knee pain Improved with weight loss and exercise regime she currently has, will add approximately 10 minutes per day and start  pedaling/ biking  Essential hypertension Controlled, no change in medication DASH diet and commitment to daily physical activity for a minimum of 30 minutes discussed and encouraged, as a part of hypertension management. The importance of attaining a healthy weight is also discussed.     01/29/2024   10:34 AM 01/29/2024   10:07 AM 12/29/2023   10:06 AM 09/29/2023    8:09 AM 07/31/2023   10:30 AM 07/31/2023    9:49 AM 07/31/2023    9:47 AM  BP/Weight  Systolic BP 138 154 122 -- 130 135 142  Diastolic BP 74 79 66 -- 80 78 77  Wt. (Lbs)  205.12 208.2 205   210  BMI  32.13 kg/m2 31.66 kg/m2 31.17 kg/m2   31.93 kg/m2

## 2024-01-29 NOTE — Assessment & Plan Note (Signed)
Improved and controlled on current regime 

## 2024-02-04 DIAGNOSIS — H5213 Myopia, bilateral: Secondary | ICD-10-CM | POA: Diagnosis not present

## 2024-04-09 ENCOUNTER — Other Ambulatory Visit: Payer: Self-pay | Admitting: Family Medicine

## 2024-04-27 ENCOUNTER — Other Ambulatory Visit: Payer: Self-pay | Admitting: Family Medicine

## 2024-05-14 ENCOUNTER — Other Ambulatory Visit: Payer: Self-pay | Admitting: Family Medicine

## 2024-06-14 ENCOUNTER — Emergency Department (HOSPITAL_COMMUNITY)
Admission: EM | Admit: 2024-06-14 | Discharge: 2024-06-14 | Disposition: A | Discharge status: Home / Self Care | Attending: Emergency Medicine | Admitting: Emergency Medicine

## 2024-06-14 ENCOUNTER — Emergency Department (HOSPITAL_COMMUNITY)

## 2024-06-14 ENCOUNTER — Encounter (HOSPITAL_COMMUNITY): Payer: Self-pay | Admitting: *Deleted

## 2024-06-14 ENCOUNTER — Other Ambulatory Visit: Payer: Self-pay

## 2024-06-14 DIAGNOSIS — M25561 Pain in right knee: Secondary | ICD-10-CM | POA: Diagnosis not present

## 2024-06-14 DIAGNOSIS — S3992XA Unspecified injury of lower back, initial encounter: Secondary | ICD-10-CM | POA: Diagnosis not present

## 2024-06-14 DIAGNOSIS — M25562 Pain in left knee: Secondary | ICD-10-CM | POA: Diagnosis not present

## 2024-06-14 DIAGNOSIS — Z79899 Other long term (current) drug therapy: Secondary | ICD-10-CM | POA: Diagnosis not present

## 2024-06-14 DIAGNOSIS — S8002XA Contusion of left knee, initial encounter: Secondary | ICD-10-CM | POA: Diagnosis not present

## 2024-06-14 DIAGNOSIS — M438X6 Other specified deforming dorsopathies, lumbar region: Secondary | ICD-10-CM | POA: Diagnosis not present

## 2024-06-14 DIAGNOSIS — M170 Bilateral primary osteoarthritis of knee: Secondary | ICD-10-CM | POA: Diagnosis not present

## 2024-06-14 DIAGNOSIS — M4316 Spondylolisthesis, lumbar region: Secondary | ICD-10-CM | POA: Diagnosis not present

## 2024-06-14 DIAGNOSIS — S39012A Strain of muscle, fascia and tendon of lower back, initial encounter: Secondary | ICD-10-CM | POA: Diagnosis not present

## 2024-06-14 DIAGNOSIS — W010XXA Fall on same level from slipping, tripping and stumbling without subsequent striking against object, initial encounter: Secondary | ICD-10-CM | POA: Diagnosis not present

## 2024-06-14 DIAGNOSIS — S8001XA Contusion of right knee, initial encounter: Secondary | ICD-10-CM | POA: Diagnosis not present

## 2024-06-14 DIAGNOSIS — W19XXXA Unspecified fall, initial encounter: Secondary | ICD-10-CM

## 2024-06-14 DIAGNOSIS — M47816 Spondylosis without myelopathy or radiculopathy, lumbar region: Secondary | ICD-10-CM | POA: Diagnosis not present

## 2024-06-14 MED ORDER — IBUPROFEN 800 MG PO TABS: ORAL_TABLET | ORAL | 0 refills | Status: DC

## 2024-06-14 NOTE — ED Triage Notes (Signed)
 Pt was tripped and fell yesterday. Denies hitting her head with fall. Pt states she fell back and slide some on the floor.  Pt c/o bilateral knee pain. +lower back pain.

## 2024-06-14 NOTE — ED Provider Notes (Signed)
 [place] EMERGENCY DEPARTMENT AT [facility] Provider Note   CSN: [id number] Arrival date & time: 06/14/24  1155     Patient presents with: Tammy Galvan is a 89 y.o. female.   Patient tripped and fell couple days ago and complains of bilateral knee pain and lower back pain  The history is provided by the patient and medical records. No language interpreter was used.  Fall This is a new problem. The current episode started 2 days ago. The problem occurs rarely. The problem has been resolved. Pertinent negatives include no chest pain, no abdominal pain and no headaches. Nothing aggravates the symptoms. Nothing relieves the symptoms. She has tried nothing for the symptoms.       Prior to Admission medications   Medication Sig Start Date End Date Taking? Authorizing Provider  amLODipine  (NORVASC ) 10 MG tablet TAKE 1 TABLET BY MOUTH EVERY DAY 01/12/24   Antonetta Rollene BRAVO, MD  brimonidine-timolol (COMBIGAN) 0.2-0.5 % ophthalmic solution Place 1 drop into both eyes 2 (two) times daily.    [provider]  chlorpheniramine  (CHLOR-TRIMETON ) 4 MG tablet Take one tablet by mouth once daily, as needed, for excessive nasal drainage 07/31/23   Antonetta Rollene BRAVO, MD  fluticasone  (FLONASE ) 50 MCG/ACT nasal spray Place 1 spray into both nostrils daily. 07/31/23   Antonetta Rollene BRAVO, MD  hydrochlorothiazide  (HYDRODIURIL ) 25 MG tablet TAKE 1 TABLET BY MOUTH EVERY DAY 05/14/24   Antonetta Rollene BRAVO, MD  ibuprofen  (ADVIL ) 600 MG tablet TAKE ONE TABLET BY MOUTH TWO TIMES DAILY AS NEEDED, FOR BACK PAIN 07/25/22   Antonetta Rollene BRAVO, MD  levothyroxine  (SYNTHROID ) 75 MCG tablet TAKE 1 TABLET BY MOUTH EVERY DAY BEFORE BREAKFAST 10/24/23   Nida, Gebreselassie W, MD  metoprolol  succinate (TOPROL -XL) 25 MG 24 hr tablet TAKE 1 TABLET BY MOUTH EVERY DAY 05/14/24   Antonetta Rollene BRAVO, MD  potassium chloride  SA (KLOR-CON  M) 20 MEQ tablet TAKE 1 TABLET BY MOUTH EVERY DAY 04/09/24    Antonetta Rollene BRAVO, MD  pravastatin  (PRAVACHOL ) 10 MG tablet TAKE 1 TABLET BY MOUTH EVERY DAY 04/27/24   Antonetta Rollene BRAVO, MD    Allergies: Iodine and Ace inhibitors    Review of Systems  Constitutional:  Negative for appetite change and fatigue.  HENT:  Negative for congestion, ear discharge and sinus pressure.   Eyes:  Negative for discharge.  Respiratory:  Negative for cough.   Cardiovascular:  Negative for chest pain.  Gastrointestinal:  Negative for abdominal pain and diarrhea.  Genitourinary:  Negative for frequency and hematuria.  Musculoskeletal:  Positive for back pain.       Bilateral knee pain  Skin:  Negative for rash.  Neurological:  Negative for seizures and headaches.  Psychiatric/Behavioral:  Negative for hallucinations.     Updated Vital Signs BP (!) 149/62 (BP Location: Right Arm)   Pulse 60   Temp 98.3 F (36.8 C)   Resp 18   Ht 5' 7 (1.702 m)   Wt 93.9 kg   SpO2 99%   BMI 32.42 kg/m   Physical Exam Vitals and nursing note reviewed.  Constitutional:      Appearance: She is well-developed.  HENT:     Head: Normocephalic.     Nose: Nose normal.     Mouth/Throat:     Mouth: Mucous membranes are moist.  Eyes:     General: No scleral icterus.    Conjunctiva/sclera: Conjunctivae normal.  Neck:     Thyroid :  No thyromegaly.  Cardiovascular:     Rate and Rhythm: Normal rate and regular rhythm.     Heart sounds: No murmur heard.    No friction rub. No gallop.  Pulmonary:     Breath sounds: No stridor. No wheezing or rales.  Chest:     Chest wall: No tenderness.  Abdominal:     General: There is no distension.     Tenderness: There is no abdominal tenderness. There is no rebound.  Musculoskeletal:        General: Normal range of motion.     Cervical back: Neck supple.     Comments: Mild tenderness to both knees and lower back  Lymphadenopathy:     Cervical: No cervical adenopathy.  Skin:    Findings: No erythema or rash.  Neurological:      Mental Status: She is alert and oriented to person, place, and time.     Motor: No abnormal muscle tone.     Coordination: Coordination normal.  Psychiatric:        Behavior: Behavior normal.     (all labs ordered are listed, but only abnormal results are displayed) Labs Reviewed - No data to display  EKG: None  Radiology: DG Knee Complete 4 Views Right Result Date: 06/14/2024 CLINICAL DATA:  pain. Patient tripped and fell. Bilateral knee pain. EXAM: RIGHT KNEE - COMPLETE 4+ VIEW; LEFT KNEE - COMPLETE 4+ VIEW COMPARISON:  None Available. FINDINGS: No acute fracture or dislocation. No aggressive osseous lesion. There are degenerative changes of bilateral knee joints in the form of mildly reduced medial tibio-femoral compartment joint space, tibial spiking and tricompartmental osteophytosis. There is also reduced patellofemoral joint space bilaterally. No knee effusion or focal soft tissue swelling. No radiopaque foreign bodies. IMPRESSION: No acute osseous abnormality of bilateral knees. Mild degenerative changes of bilateral knee joints. Electronically Signed   By: Ree Molt M.D.   On: 06/14/2024 13:47   DG Knee Complete 4 Views Left Result Date: 06/14/2024 CLINICAL DATA:  pain. Patient tripped and fell. Bilateral knee pain. EXAM: RIGHT KNEE - COMPLETE 4+ VIEW; LEFT KNEE - COMPLETE 4+ VIEW COMPARISON:  None Available. FINDINGS: No acute fracture or dislocation. No aggressive osseous lesion. There are degenerative changes of bilateral knee joints in the form of mildly reduced medial tibio-femoral compartment joint space, tibial spiking and tricompartmental osteophytosis. There is also reduced patellofemoral joint space bilaterally. No knee effusion or focal soft tissue swelling. No radiopaque foreign bodies. IMPRESSION: No acute osseous abnormality of bilateral knees. Mild degenerative changes of bilateral knee joints. Electronically Signed   By: Ree Molt M.D.   On: 06/14/2024 13:47    DG Lumbar Spine Complete Result Date: 06/14/2024 CLINICAL DATA:  Patient tripped and fell. EXAM: LUMBAR SPINE - COMPLETE 4+ VIEW COMPARISON:  07/24/2021. FINDINGS: There are 5 nonrib-bearing lumbar vertebrae. Anatomic lumbar curvature. No spondylolysis. There is grade 1 anterolisthesis of L4 over L5. Vertebral body heights are maintained. No aggressive osseous lesion. Mild-moderate multilevel degenerative changes in the form of reduced intervertebral disc height, facet arthropathy and marginal osteophyte formation. Sacroiliac joints are symmetric. Visualized soft tissues are within normal limits. IMPRESSION: No acute osseous abnormality of the lumbar spine. Mild-to-moderate multilevel degenerative changes. Electronically Signed   By: Ree Molt M.D.   On: 06/14/2024 13:46     Procedures   Medications Ordered in the ED - No data to display  Medical Decision Making Amount and/or Complexity of Data Reviewed Radiology: ordered.  Risk Prescription drug management.   Patient with contusion to both knees and lumbar strain.  She will take Tylenol and Motrin  for discomfort and follow-up with PCP     Final diagnoses:  None    ED Discharge Orders     None          Suzette Pac, MD 06/14/24 [phone]

## 2024-06-14 NOTE — Discharge Instructions (Signed)
Follow-up with your family doctor if not improving 

## 2024-06-23 DIAGNOSIS — E039 Hypothyroidism, unspecified: Secondary | ICD-10-CM | POA: Diagnosis not present

## 2024-06-23 DIAGNOSIS — R7303 Prediabetes: Secondary | ICD-10-CM | POA: Diagnosis not present

## 2024-06-23 DIAGNOSIS — E785 Hyperlipidemia, unspecified: Secondary | ICD-10-CM | POA: Diagnosis not present

## 2024-06-24 ENCOUNTER — Telehealth: Payer: Self-pay

## 2024-06-24 ENCOUNTER — Ambulatory Visit: Payer: Self-pay | Admitting: Family Medicine

## 2024-06-24 LAB — CMP14+EGFR
ALT: 14 IU/L (ref 0–32)
AST: 21 IU/L (ref 0–40)
Albumin: 4.1 g/dL (ref 3.7–4.7)
Alkaline Phosphatase: 82 IU/L (ref 44–121)
BUN/Creatinine Ratio: 15 (ref 12–28)
BUN: 13 mg/dL (ref 8–27)
Bilirubin Total: 0.5 mg/dL (ref 0.0–1.2)
CO2: 24 mmol/L (ref 20–29)
Calcium: 9.6 mg/dL (ref 8.7–10.3)
Chloride: 103 mmol/L (ref 96–106)
Creatinine, Ser: 0.85 mg/dL (ref 0.57–1.00)
Globulin, Total: 2.5 g/dL (ref 1.5–4.5)
Glucose: 117 mg/dL — ABNORMAL HIGH (ref 70–99)
Potassium: 4 mmol/L (ref 3.5–5.2)
Sodium: 141 mmol/L (ref 134–144)
Total Protein: 6.6 g/dL (ref 6.0–8.5)
eGFR: 66 mL/min/1.73 (ref >59)

## 2024-06-24 LAB — LIPID PANEL W/O CHOL/HDL RATIO
Cholesterol, Total: 152 mg/dL (ref 100–199)
HDL: 56 mg/dL (ref >39)
LDL Chol Calc (NIH): 84 mg/dL (ref 0–99)
Triglycerides: 59 mg/dL (ref 0–149)
VLDL Cholesterol Cal: 12 mg/dL (ref 5–40)

## 2024-06-24 LAB — COMPREHENSIVE METABOLIC PANEL WITH GFR
ALT: 14 IU/L (ref 0–32)
AST: 18 IU/L (ref 0–40)
Albumin: 4 g/dL (ref 3.7–4.7)
Alkaline Phosphatase: 80 IU/L (ref 44–121)
BUN/Creatinine Ratio: 15 (ref 12–28)
BUN: 13 mg/dL (ref 8–27)
Bilirubin Total: 0.5 mg/dL (ref 0.0–1.2)
CO2: 25 mmol/L (ref 20–29)
Calcium: 9.5 mg/dL (ref 8.7–10.3)
Chloride: 106 mmol/L (ref 96–106)
Creatinine, Ser: 0.88 mg/dL (ref 0.57–1.00)
Globulin, Total: 2.5 g/dL (ref 1.5–4.5)
Glucose: 111 mg/dL — ABNORMAL HIGH (ref 70–99)
Potassium: 4.1 mmol/L (ref 3.5–5.2)
Sodium: 145 mmol/L — ABNORMAL HIGH (ref 134–144)
Total Protein: 6.5 g/dL (ref 6.0–8.5)
eGFR: 63 mL/min/1.73 (ref >59)

## 2024-06-24 LAB — TSH: TSH: 2.53 u[IU]/mL (ref 0.450–4.500)

## 2024-06-24 LAB — HEMOGLOBIN A1C
Est. average glucose Bld gHb Est-mCnc: 120 mg/dL
Hgb A1c MFr Bld: 5.8 % — ABNORMAL HIGH (ref 4.8–5.6)

## 2024-06-24 LAB — T4, FREE: Free T4: 1.46 ng/dL (ref 0.82–1.77)

## 2024-06-24 NOTE — Telephone Encounter (Signed)
 Copied from CRM #[id number]. Topic: Clinical - Lab/Test Results >> Jun 24, 2024  3:46 PM Delon DASEN wrote: Reason for CRM: read results verbatim - no questions

## 2024-06-24 NOTE — Telephone Encounter (Signed)
 Noted thank you

## 2024-06-30 ENCOUNTER — Encounter: Payer: Self-pay | Admitting: "Endocrinology

## 2024-06-30 ENCOUNTER — Ambulatory Visit (INDEPENDENT_AMBULATORY_CARE_PROVIDER_SITE_OTHER): Admitting: "Endocrinology

## 2024-06-30 VITALS — BP 134/66 | HR 80 | Ht 67.0 in | Wt 208.2 lb

## 2024-06-30 DIAGNOSIS — E039 Hypothyroidism, unspecified: Secondary | ICD-10-CM | POA: Diagnosis not present

## 2024-06-30 DIAGNOSIS — R7303 Prediabetes: Secondary | ICD-10-CM

## 2024-06-30 DIAGNOSIS — E782 Mixed hyperlipidemia: Secondary | ICD-10-CM

## 2024-06-30 MED ORDER — LEVOTHYROXINE SODIUM 75 MCG PO TABS: 75.0000 ug | ORAL_TABLET | Freq: Every day | ORAL | 3 refills | Status: AC

## 2024-06-30 NOTE — Progress Notes (Signed)
 06/30/2024     Endocrinology follow-up note  Chief complaint: Follow-up for hypothyroidism   HPI  Tammy Galvan is a 89 y.o.-year-old female. -She is being seen in follow-up for management of hypothyroidism, and multinodular goiter.     She is currently on levothyroxine  75 mcg p.o. daily before breakfast.  She reports compliance and consistency.  Her previsit labs are consistent with appropriate replacement.     She has no new complaints today.   She has steady weight, no palpitations, tremors, heat/cold intolerance.  -Her last thyroid  ultrasound was consistent with slight shrinking of bilateral thyroid  lobes as well as previously documented nodules.  Left thyroid  lobe nodule fine-needle aspiration in November 2016 was negative for malignancy.  Her previsit A1c was 5.8% still consistent with prediabetes.   ROS: Limited as above.  PE: BP 134/66   Pulse 80   Ht 5' 7 (1.702 m)   Wt 208 lb 3.2 oz (94.4 kg)   BMI 32.61 kg/m  Wt Readings from Last 3 Encounters:  06/30/24 208 lb 3.2 oz (94.4 kg)  06/14/24 207 lb (93.9 kg)  01/29/24 205 lb 1.9 oz (93 kg)     June 03, 2018 thyroid  ultrasound IMPRESSION: 1. Normal-sized thyroid  with stable solitary left nodule, previously biopsied. No indication for biopsy or dedicated imaging follow-up  August 23, 2015 fine-needle aspiration of left lobe nodule benign follicular nodule.   Recent Results (from the past 2160 hours)  Lipid Panel w/o Chol/HDL Ratio     Status: None   Collection Time: 06/23/24 11:20 AM  Result Value Ref Range   Cholesterol, Total 152 100 - 199 mg/dL   Triglycerides 59 0 - 149 mg/dL   HDL 56 >60 mg/dL   VLDL Cholesterol Cal 12 5 - 40 mg/dL   LDL Chol Calc (NIH) 84 0 - 99 mg/dL  [id number]     Status: Abnormal   Collection Time: 06/23/24 11:20 AM  Result Value Ref Range   Glucose 117 (H) 70 - 99 mg/dL   BUN 13 8 - 27 mg/dL   Creatinine, Ser 9.14 0.57 - 1.00 mg/dL   eGFR 66 >40 fO/fpw/8.26    BUN/Creatinine Ratio 15 12 - 28   Sodium 141 134 - 144 mmol/L   Potassium 4.0 3.5 - 5.2 mmol/L   Chloride 103 96 - 106 mmol/L   CO2 24 20 - 29 mmol/L   Calcium  9.6 8.7 - 10.3 mg/dL   Total Protein 6.6 6.0 - 8.5 g/dL   Albumin 4.1 3.7 - 4.7 g/dL   Globulin, Total 2.5 1.5 - 4.5 g/dL   Bilirubin Total 0.5 0.0 - 1.2 mg/dL   Alkaline Phosphatase 82 44 - 121 IU/L    Comment: **Effective July 05, 2024 Alkaline Phosphatase**   reference interval will be changing to:              Age                Female          Female           0 -  5 days         47 - 127       47 - 127           6 - 10 days         29 - 242       29 - 242          11 -  20 days        109 - 357      109 - 357          21 - 30 days         94 - 494       94 - 494           1 -  2 months      149 - 539      149 - 539           3 -  6 months      131 - 452      131 - 452           7 - 11 months      117 - 401      117 - 401   12 months -  6 years       158 - 369      158 - 369           7 - 12 years       150 - 409      150 - 409               13 years       156 - 435       78 - 227               14 years       114 - 375       64 - 161               15 years        88 - 279       56 - 134               16 years        74 - 207       51 - 121               17 years        63 - 161       47 - 113          18 - 20 years        51 - 125       42 - 106          21 - 50 years         47 - 123       41 - 116          51 - 80 years        49 - 135       51 - 125              >80 years        48 - 129       48 - 129    AST 21 0 - 40 IU/L   ALT 14 0 - 32 IU/L  HgB A1c     Status: Abnormal   Collection Time: 06/23/24 11:20 AM  Result Value Ref Range   Hgb A1c MFr Bld 5.8 (H) 4.8 - 5.6 %    Comment:          Prediabetes: 5.7 - 6.4          Diabetes: >6.4          Glycemic control for adults with diabetes: <7.0  Est. average glucose Bld gHb Est-mCnc 120 mg/dL  Comprehensive metabolic panel     Status: Abnormal    Collection Time: 06/23/24 11:24 AM  Result Value Ref Range   Glucose 111 (H) 70 - 99 mg/dL   BUN 13 8 - 27 mg/dL   Creatinine, Ser 9.11 0.57 - 1.00 mg/dL   eGFR 63 >40 fO/fpw/8.26   BUN/Creatinine Ratio 15 12 - 28   Sodium 145 (H) 134 - 144 mmol/L   Potassium 4.1 3.5 - 5.2 mmol/L   Chloride 106 96 - 106 mmol/L   CO2 25 20 - 29 mmol/L   Calcium  9.5 8.7 - 10.3 mg/dL   Total Protein 6.5 6.0 - 8.5 g/dL   Albumin 4.0 3.7 - 4.7 g/dL   Globulin, Total 2.5 1.5 - 4.5 g/dL   Bilirubin Total 0.5 0.0 - 1.2 mg/dL   Alkaline Phosphatase 80 44 - 121 IU/L    Comment: **Effective July 05, 2024 Alkaline Phosphatase**   reference interval will be changing to:              Age                Female          Female           0 -  5 days         47 - 127       47 - 127           6 - 10 days         29 - 242       29 - 242          11 - 20 days        109 - 357      109 - 357          21 - 30 days         94 - 494       94 - 494           1 -  2 months      149 - 539      149 - 539           3 -  6 months      131 - 452      131 - 452           7 - 11 months      117 - 401      117 - 401   12 months -  6 years       158 - 369      158 - 369           7 - 12 years       150 - 409      150 - 409               13 years       156 - 435       78 - 227               14 years       114 - 375       64 - 161               15 years        88 - 279       56 - 134  16 years        74 - 207       51 - 121               17 years        63 - 161       47 - 113          18 - 20 years        51 - 125       42 - 106          21 - 50 years         47 - 123       41 - 116          51 - 80 years        49 - 135       51 - 125              >80 years        48 - 129       48 - 129    AST 18 0 - 40 IU/L   ALT 14 0 - 32 IU/L  TSH     Status: None   Collection Time: 06/23/24 11:24 AM  Result Value Ref Range   TSH 2.530 0.450 - 4.500 uIU/mL  T4, free     Status: None   Collection Time: 06/23/24 11:24 AM   Result Value Ref Range   Free T4 1.46 0.82 - 1.77 ng/dL    ASSESSMENT: 1. MNG 2. Hypothyroidism 3.  Prediabetes  PLAN:  -Her previsit thyroid  function tests are consistent with appropriate replacement.  She is advised to continue Synthroid  75 mcg p.o. daily before breakfast.     - We discussed about the correct intake of her thyroid  hormone, on empty stomach at fasting, with water , separated by at least 30 minutes from breakfast and other medications,  and separated by more than 4 hours from calcium , iron, multivitamins, acid reflux medications (PPIs). -Patient is made aware of the fact that thyroid  hormone replacement is needed for life, dose to be adjusted by periodic monitoring of thyroid  function tests.  -She is status post fine needle aspiration which was reported to be benign follicular nodule in November 2016 -[name].  Her thyroid  ultrasound from June 03, 2018 is consistent with shrinking of bilateral thyroid  lobes and previously biopsied nodules.  -Regarding her hyperlipidemia, she returns with significant improvement in her LDL stable at 84.  She is tolerating and benefiting from low-dose statin.  She is advised to continue pravastatin  10 mg p.o. nightly.    I advised her to maintain close follow up with Dr. Rollene Pesa  for her primary care needs.   I spent  20  minutes in the care of the patient today including review of labs from Thyroid  Function, CMP, and other relevant labs ; imaging/biopsy records (current and previous including abstractions from other facilities); face-to-face time discussing  her lab results and symptoms, medications doses, her options of short and long term treatment based on the latest standards of care / guidelines;   and documenting the encounter.  Rumalda LULLA Pan  participated in the discussions, expressed understanding, and voiced agreement with the above plans.  All questions were answered to her satisfaction. she is encouraged to  contact clinic should she have any questions or concerns prior to her return visit.   Ranny Earl, MD  Tel. [phone], Fax [phone]  -  This note was partially dictated with voice recognition software. Similar sounding words can be transcribed inadequately or may not  be corrected upon review.

## 2024-07-16 ENCOUNTER — Ambulatory Visit (HOSPITAL_COMMUNITY)
Admission: RE | Admit: 2024-07-16 | Discharge: 2024-07-16 | Disposition: A | Discharge status: Home / Self Care | Source: Ambulatory Visit | Attending: Family Medicine | Admitting: Family Medicine

## 2024-07-16 DIAGNOSIS — Z1231 Encounter for screening mammogram for malignant neoplasm of breast: Secondary | ICD-10-CM | POA: Diagnosis not present

## 2024-07-21 ENCOUNTER — Other Ambulatory Visit: Payer: Self-pay | Admitting: Family Medicine

## 2024-07-24 ENCOUNTER — Other Ambulatory Visit: Payer: Self-pay | Admitting: Family Medicine

## 2024-07-29 DIAGNOSIS — E785 Hyperlipidemia, unspecified: Secondary | ICD-10-CM | POA: Diagnosis not present

## 2024-07-29 DIAGNOSIS — Z008 Encounter for other general examination: Secondary | ICD-10-CM | POA: Diagnosis not present

## 2024-07-30 ENCOUNTER — Ambulatory Visit: Admitting: Family Medicine

## 2024-08-06 ENCOUNTER — Ambulatory Visit: Payer: Self-pay

## 2024-08-06 NOTE — Telephone Encounter (Signed)
 Mailed results to pt.

## 2024-08-20 ENCOUNTER — Encounter: Admitting: Family Medicine

## 2024-10-09 ENCOUNTER — Other Ambulatory Visit: Payer: Self-pay | Admitting: Family Medicine

## 2024-10-22 ENCOUNTER — Other Ambulatory Visit: Payer: Self-pay | Admitting: Family Medicine

## 2024-10-27 ENCOUNTER — Telehealth: Payer: Self-pay | Admitting: Family Medicine

## 2024-10-27 ENCOUNTER — Other Ambulatory Visit: Payer: Self-pay

## 2024-10-27 DIAGNOSIS — I10 Essential (primary) hypertension: Secondary | ICD-10-CM

## 2024-10-27 DIAGNOSIS — R7303 Prediabetes: Secondary | ICD-10-CM

## 2024-10-27 DIAGNOSIS — R7302 Impaired glucose tolerance (oral): Secondary | ICD-10-CM

## 2024-10-27 DIAGNOSIS — E559 Vitamin D deficiency, unspecified: Secondary | ICD-10-CM

## 2024-10-27 DIAGNOSIS — E782 Mixed hyperlipidemia: Secondary | ICD-10-CM

## 2024-10-27 NOTE — Telephone Encounter (Signed)
 Pt wanting to come in the morning to have her labs done- supposed to have them done before next visit but I'm not seeing the orders in. Can these be put in? Thanks

## 2024-10-27 NOTE — Telephone Encounter (Signed)
 Fasting cBC, lipid panel , cmp and EGFr, HBA1C and vit D please

## 2024-10-27 NOTE — Telephone Encounter (Signed)
 Ordered. Tried calling n/a, continuous ring

## 2024-10-29 ENCOUNTER — Ambulatory Visit

## 2024-10-29 ENCOUNTER — Encounter: Payer: Self-pay | Admitting: Family Medicine

## 2024-10-29 ENCOUNTER — Ambulatory Visit: Payer: Self-pay | Admitting: Family Medicine

## 2024-10-29 ENCOUNTER — Ambulatory Visit: Admitting: Family Medicine

## 2024-10-29 VITALS — BP 123/67 | HR 66 | Resp 16 | Ht 67.0 in | Wt 210.0 lb

## 2024-10-29 DIAGNOSIS — I10 Essential (primary) hypertension: Secondary | ICD-10-CM

## 2024-10-29 DIAGNOSIS — R7302 Impaired glucose tolerance (oral): Secondary | ICD-10-CM | POA: Diagnosis not present

## 2024-10-29 DIAGNOSIS — E782 Mixed hyperlipidemia: Secondary | ICD-10-CM | POA: Diagnosis not present

## 2024-10-29 DIAGNOSIS — E559 Vitamin D deficiency, unspecified: Secondary | ICD-10-CM

## 2024-10-29 DIAGNOSIS — Z0000 Encounter for general adult medical examination without abnormal findings: Secondary | ICD-10-CM | POA: Diagnosis not present

## 2024-10-29 LAB — LIPID PANEL
Chol/HDL Ratio: 2.6 ratio (ref 0.0–4.4)
Cholesterol, Total: 152 mg/dL (ref 100–199)
HDL: 58 mg/dL
LDL Chol Calc (NIH): 81 mg/dL (ref 0–99)
Triglycerides: 65 mg/dL (ref 0–149)
VLDL Cholesterol Cal: 13 mg/dL (ref 5–40)

## 2024-10-29 LAB — CBC WITH DIFFERENTIAL/PLATELET
Basophils Absolute: 0 x10E3/uL (ref 0.0–0.2)
Basos: 1 %
EOS (ABSOLUTE): 0.6 x10E3/uL — ABNORMAL HIGH (ref 0.0–0.4)
Eos: 9 %
Hematocrit: 45.9 % (ref 34.0–46.6)
Hemoglobin: 14.2 g/dL (ref 11.1–15.9)
Immature Grans (Abs): 0 x10E3/uL (ref 0.0–0.1)
Immature Granulocytes: 0 %
Lymphocytes Absolute: 2 x10E3/uL (ref 0.7–3.1)
Lymphs: 32 %
MCH: 27.4 pg (ref 26.6–33.0)
MCHC: 30.9 g/dL — ABNORMAL LOW (ref 31.5–35.7)
MCV: 89 fL (ref 79–97)
Monocytes Absolute: 0.6 x10E3/uL (ref 0.1–0.9)
Monocytes: 9 %
Neutrophils Absolute: 3.2 x10E3/uL (ref 1.4–7.0)
Neutrophils: 49 %
Platelets: 313 x10E3/uL (ref 150–450)
RBC: 5.18 x10E6/uL (ref 3.77–5.28)
RDW: 14.1 % (ref 11.7–15.4)
WBC: 6.5 x10E3/uL (ref 3.4–10.8)

## 2024-10-29 LAB — CMP14+EGFR
ALT: 13 IU/L (ref 0–32)
AST: 20 IU/L (ref 0–40)
Albumin: 3.8 g/dL (ref 3.7–4.7)
Alkaline Phosphatase: 79 IU/L (ref 48–129)
BUN/Creatinine Ratio: 19 (ref 12–28)
BUN: 14 mg/dL (ref 8–27)
Bilirubin Total: 0.5 mg/dL (ref 0.0–1.2)
CO2: 25 mmol/L (ref 20–29)
Calcium: 9.7 mg/dL (ref 8.7–10.3)
Chloride: 104 mmol/L (ref 96–106)
Creatinine, Ser: 0.74 mg/dL (ref 0.57–1.00)
Globulin, Total: 2.5 g/dL (ref 1.5–4.5)
Glucose: 80 mg/dL (ref 70–99)
Potassium: 4.3 mmol/L (ref 3.5–5.2)
Sodium: 143 mmol/L (ref 134–144)
Total Protein: 6.3 g/dL (ref 6.0–8.5)
eGFR: 77 mL/min/1.73

## 2024-10-29 LAB — HEMOGLOBIN A1C
Est. average glucose Bld gHb Est-mCnc: 123 mg/dL
Hgb A1c MFr Bld: 5.9 % — ABNORMAL HIGH (ref 4.8–5.6)

## 2024-10-29 LAB — VITAMIN D 25 HYDROXY (VIT D DEFICIENCY, FRACTURES): Vit D, 25-Hydroxy: 31.1 ng/mL (ref 30.0–100.0)

## 2024-10-29 NOTE — Patient Instructions (Addendum)
"   F/U end August , flu vaccine at visit  Excellent  labs and exam  Reconsider Covid vaccine  Nurse please document TdAP states she has had this  Fasting lipid, cmp  HBA1C and EGFr and Vit D 3 to 5 days before   Thanks for choosing [facility], we consider it a privelige to serve you.      "

## 2024-10-29 NOTE — Progress Notes (Unsigned)
" ° ° °  FEMALE IAFRATE     MRN: [id number]      DOB: 07-Dec-1932  Chief Complaint  Patient presents with   Annual Exam    Cpe     HPI: Patient is in for annual physical exam. No other health concerns are expressed or addressed at the visit. Recent labs,  are reviewed. Immunization is reviewed , and  updated if needed.   PE: Pleasant  female, alert and oriented x 3, in no cardio-pulmonary distress. Afebrile. HEENT No facial trauma or asymetry. Sinuses non tender.  Extra occullar muscles intact.. External ears normal, . Neck: supple, no adenopathy,JVD or thyromegaly.No bruits.  Chest: Clear to ascultation bilaterally.No crackles or wheezes. Non tender to palpation  Breast: No asymetry,no masses or lumps. No tenderness. No nipple discharge or inversion. No axillary or supraclavicular adenopathy  Cardiovascular system; Heart sounds normal,  S1 and  S2 ,no S3.  No murmur, or thrill. Apical beat not displaced Peripheral pulses normal.  Abdomen: Soft, non tender, no organomegaly or masses. No bruits. Bowel sounds normal. No guarding, tenderness or rebound.   GU: External genitalia normal female genitalia , normal female distribution of hair. No lesions. Urethral meatus normal in size, no  Prolapse, no lesions visibly  Present. Bladder non tender. Vagina pink and moist , with no visible lesions , discharge present . Adequate pelvic support no  cystocele or rectocele noted Cervix pink and appears healthy, no lesions or ulcerations noted, no discharge noted from os Uterus normal size, no adnexal masses, no cervical motion or adnexal tenderness.   Musculoskeletal exam: Full ROM of spine, hips , shoulders and knees. No deformity ,swelling or crepitus noted. No muscle wasting or atrophy.   Neurologic: Cranial nerves 2 to 12 intact. Power, tone ,sensation and reflexes normal throughout. No disturbance in gait. No tremor.  Skin: Intact, no ulceration, erythema , scaling  or rash noted. Pigmentation normal throughout  Psych; Normal mood and affect. Judgement and concentration normal   Assessment & Plan:  No problem-specific Assessment & Plan notes found for this encounter.  "

## 2024-10-30 ENCOUNTER — Other Ambulatory Visit: Payer: Self-pay | Admitting: Family Medicine

## 2024-10-30 NOTE — Assessment & Plan Note (Signed)

## 2024-11-03 ENCOUNTER — Ambulatory Visit

## 2024-11-05 ENCOUNTER — Other Ambulatory Visit: Payer: Self-pay | Admitting: Family Medicine

## 2025-06-16 ENCOUNTER — Ambulatory Visit: Payer: Self-pay | Admitting: Family Medicine

## 2025-06-30 ENCOUNTER — Ambulatory Visit: Admitting: "Endocrinology
